# Patient Record
Sex: Female | Born: 1985
Health system: Southern US, Community
[De-identification: ages and names within clinical notes are randomized; demographics above are authoritative.]

## PROBLEM LIST (undated history)

## (undated) DIAGNOSIS — Z8489 Family history of other specified conditions: Secondary | ICD-10-CM

## (undated) DIAGNOSIS — F410 Panic disorder [episodic paroxysmal anxiety] without agoraphobia: Secondary | ICD-10-CM

## (undated) DIAGNOSIS — F4310 Post-traumatic stress disorder, unspecified: Secondary | ICD-10-CM

## (undated) DIAGNOSIS — D66 Hereditary factor VIII deficiency: Secondary | ICD-10-CM

## (undated) DIAGNOSIS — I780 Hereditary hemorrhagic telangiectasia: Secondary | ICD-10-CM

## (undated) DIAGNOSIS — F329 Major depressive disorder, single episode, unspecified: Secondary | ICD-10-CM

## (undated) DIAGNOSIS — G4700 Insomnia, unspecified: Secondary | ICD-10-CM

## (undated) DIAGNOSIS — Z9289 Personal history of other medical treatment: Secondary | ICD-10-CM

## (undated) DIAGNOSIS — J45909 Unspecified asthma, uncomplicated: Secondary | ICD-10-CM

## (undated) DIAGNOSIS — I10 Essential (primary) hypertension: Secondary | ICD-10-CM

## (undated) HISTORY — PX: ABDOMINAL HYSTERECTOMY: SHX81

## (undated) HISTORY — PX: DILATION AND CURETTAGE OF UTERUS: SHX78

## (undated) HISTORY — PX: ABDOMINAL SURGERY: SHX537

## (undated) HISTORY — PX: TUBAL LIGATION: SHX77

## (undated) HISTORY — PX: ENDOMETRIAL ABLATION: SHX621

## (undated) HISTORY — PX: NOSE SURGERY: SHX723

---

## 2011-07-16 DIAGNOSIS — G43009 Migraine without aura, not intractable, without status migrainosus: Secondary | ICD-10-CM | POA: Insufficient documentation

## 2011-11-26 DIAGNOSIS — F329 Major depressive disorder, single episode, unspecified: Secondary | ICD-10-CM | POA: Insufficient documentation

## 2013-06-28 DIAGNOSIS — D649 Anemia, unspecified: Secondary | ICD-10-CM | POA: Insufficient documentation

## 2013-07-27 DIAGNOSIS — F419 Anxiety disorder, unspecified: Secondary | ICD-10-CM | POA: Insufficient documentation

## 2013-07-27 DIAGNOSIS — I10 Essential (primary) hypertension: Secondary | ICD-10-CM | POA: Insufficient documentation

## 2014-06-23 DIAGNOSIS — J301 Allergic rhinitis due to pollen: Secondary | ICD-10-CM | POA: Insufficient documentation

## 2014-08-04 DIAGNOSIS — E669 Obesity, unspecified: Secondary | ICD-10-CM | POA: Insufficient documentation

## 2014-08-10 ENCOUNTER — Emergency Department (HOSPITAL_BASED_OUTPATIENT_CLINIC_OR_DEPARTMENT_OTHER)
Admission: EM | Admit: 2014-08-10 | Discharge: 2014-08-10 | Disposition: A | Discharge status: Home / Self Care | Payer: Medicaid Other | Attending: Emergency Medicine | Admitting: Emergency Medicine

## 2014-08-10 ENCOUNTER — Encounter (HOSPITAL_BASED_OUTPATIENT_CLINIC_OR_DEPARTMENT_OTHER): Payer: Self-pay | Admitting: *Deleted

## 2014-08-10 DIAGNOSIS — W57XXXA Bitten or stung by nonvenomous insect and other nonvenomous arthropods, initial encounter: Secondary | ICD-10-CM | POA: Insufficient documentation

## 2014-08-10 DIAGNOSIS — Z862 Personal history of diseases of the blood and blood-forming organs and certain disorders involving the immune mechanism: Secondary | ICD-10-CM | POA: Insufficient documentation

## 2014-08-10 DIAGNOSIS — Y9389 Activity, other specified: Secondary | ICD-10-CM | POA: Insufficient documentation

## 2014-08-10 DIAGNOSIS — S50361A Insect bite (nonvenomous) of right elbow, initial encounter: Secondary | ICD-10-CM | POA: Diagnosis not present

## 2014-08-10 DIAGNOSIS — Y9289 Other specified places as the place of occurrence of the external cause: Secondary | ICD-10-CM | POA: Insufficient documentation

## 2014-08-10 DIAGNOSIS — S50861A Insect bite (nonvenomous) of right forearm, initial encounter: Secondary | ICD-10-CM | POA: Insufficient documentation

## 2014-08-10 DIAGNOSIS — J45909 Unspecified asthma, uncomplicated: Secondary | ICD-10-CM | POA: Diagnosis not present

## 2014-08-10 DIAGNOSIS — Z880 Allergy status to penicillin: Secondary | ICD-10-CM | POA: Diagnosis not present

## 2014-08-10 DIAGNOSIS — I10 Essential (primary) hypertension: Secondary | ICD-10-CM | POA: Insufficient documentation

## 2014-08-10 DIAGNOSIS — Z79899 Other long term (current) drug therapy: Secondary | ICD-10-CM | POA: Insufficient documentation

## 2014-08-10 DIAGNOSIS — L03113 Cellulitis of right upper limb: Secondary | ICD-10-CM | POA: Diagnosis not present

## 2014-08-10 DIAGNOSIS — R21 Rash and other nonspecific skin eruption: Secondary | ICD-10-CM | POA: Diagnosis present

## 2014-08-10 DIAGNOSIS — S40261A Insect bite (nonvenomous) of right shoulder, initial encounter: Secondary | ICD-10-CM | POA: Diagnosis not present

## 2014-08-10 DIAGNOSIS — R110 Nausea: Secondary | ICD-10-CM | POA: Insufficient documentation

## 2014-08-10 DIAGNOSIS — Y998 Other external cause status: Secondary | ICD-10-CM | POA: Insufficient documentation

## 2014-08-10 HISTORY — DX: Essential (primary) hypertension: I10

## 2014-08-10 HISTORY — DX: Unspecified asthma, uncomplicated: J45.909

## 2014-08-10 HISTORY — DX: Hereditary factor VIII deficiency: D66

## 2014-08-10 HISTORY — DX: Personal history of other medical treatment: Z92.89

## 2014-08-10 MED ORDER — HYDROCORTISONE 1 % EX CREA: TOPICAL_CREAM | CUTANEOUS | Status: DC

## 2014-08-10 MED ORDER — CLINDAMYCIN HCL 150 MG PO CAPS: 450.0000 mg | ORAL_CAPSULE | Freq: Three times a day (TID) | ORAL | Status: DC

## 2014-08-10 NOTE — ED Provider Notes (Signed)
CSN: [id number]     Arrival date & time 08/10/14  0849 History   First MD Initiated Contact with Patient 08/10/14 [phone]     Chief Complaint  Patient presents with  . Abscess     (Consider location/radiation/quality/duration/timing/severity/associated sxs/prior Treatment) Patient is a 29 y.o. female presenting with rash. The history is provided by the patient.  Rash Location:  Shoulder/arm Shoulder/arm rash location:  R forearm, R elbow and R shoulder Quality: itchiness, painful and redness   Pain details:    Quality:  Itching and aching   Severity:  Mild   Onset quality:  Gradual   Duration:  12 hours   Timing:  Constant   Progression:  Worsening Severity:  Mild Context: insect bite/sting   Relieved by:  Nothing Worsened by:  Nothing tried Ineffective treatments:  Antihistamines Associated symptoms: nausea   Associated symptoms: no abdominal pain, no diarrhea, no fever and no shortness of breath     Past Medical History  Diagnosis Date  . Hypertension   . H/O transfusion of packed red blood cells   . Asthma   . Hemophilia    Past Surgical History  Procedure Laterality Date  . Nose surgery    . Cesarean section      x 3  . Tubal ligation    . Endometrial ablation    . Dilation and curettage of uterus     No family history on file. History  Substance Use Topics  . Smoking status: Never Smoker   . Smokeless tobacco: Not on file  . Alcohol Use: Yes     Comment: occassionally   OB History    No data available     Review of Systems  Constitutional: Negative for fever.  Respiratory: Negative for cough and shortness of breath.   Gastrointestinal: Positive for nausea. Negative for abdominal pain and diarrhea.  Skin: Positive for rash.  All other systems reviewed and are negative.     Allergies  Iron and Penicillins  Home Medications   Prior to Admission medications   Medication Sig Start Date End Date Taking? Authorizing Provider  albuterol (PROVENTIL  HFA;VENTOLIN HFA) 108 (90 BASE) MCG/ACT inhaler Inhale 2 puffs into the lungs every 6 (six) hours as needed for wheezing or shortness of breath.   Yes Historical Provider, MD  ALPRAZolam (XANAX PO) Take by mouth.   Yes Historical Provider, MD  Citalopram Hydrobromide (CELEXA PO) Take by mouth.   Yes Historical Provider, MD  Montelukast Sodium (SINGULAIR PO) Take by mouth.   Yes Historical Provider, MD   BP 117/65 mmHg  Pulse 103  Temp(Src) 98.3 F (36.8 C) (Oral)  Resp 20  Ht 5\' 5"  (1.651 m)  Wt 213 lb (96.616 kg)  BMI 35.44 kg/m2  SpO2 100% Physical Exam  Constitutional: She is oriented to person, place, and time. She appears well-developed and well-nourished. No distress.  HENT:  Head: Normocephalic and atraumatic.  Mouth/Throat: Oropharynx is clear and moist.  Eyes: EOM are normal. Pupils are equal, round, and reactive to light.  Neck: Normal range of motion. Neck supple.  Cardiovascular: Normal rate and regular rhythm.  Exam reveals no friction rub.   No murmur heard. Pulmonary/Chest: Effort normal and breath sounds normal. No respiratory distress. She has no wheezes. She has no rales.  Abdominal: Soft. She exhibits no distension. There is no tenderness. There is no rebound.  Musculoskeletal: Normal range of motion. She exhibits no edema.  Neurological: She is alert and oriented to person, place,  and time.  Skin: Rash noted. She is not diaphoretic.     Nursing note and vitals reviewed.   ED Course  Procedures (including critical care time) Labs Review Labs Reviewed - No data to display  Imaging Review No results found.   EKG Interpretation None      MDM   Final diagnoses:  Insect bites  Cellulitis of right upper extremity    29 year old female here with rash on the right arm. Multiple small red areas, root of the posterior shoulder, 2 on the lateral elbow, one on the mid distal volar forearm. The shoulder in the form lesions have small areas of erythema around  them. No relief with antihistamines. She is itching also. The lesions on her shoulder and forearm are bigger than I would've anticipated with that simple insect bite, so we'll treat with hydrocortisone cream and antibiotics to cover for infection. She states she's having some right hand pain with closing her fist, likely due to the lesion on the volar forearm. She does not have any signs concerning for compartment syndrome or severe compression of any nerves or blood vessels. She does not need an emergent hand consult. Will have her follow-up with her doctor back in the ER in 2 days.    Elwin MochaBlair Alka Falwell, MD 08/10/14 [phone]

## 2014-08-10 NOTE — ED Notes (Signed)
4 circular, raised areas on distal aspect of right forearm, proximal forearm and upper arm.  Warmth, erythema, tenderness and swelling noted to areas.  Affected areas marked by this RN.

## 2014-08-10 NOTE — ED Notes (Signed)
Patient states she noticed a possible insect bite to the right inner wrist and right posterior shoulder areas.  States she woke up this morning with additional red swollen areas on the right arm.  Took benadryl last night.  Currently is taking prednisone for a pinched nerve in the left neck.

## 2014-08-10 NOTE — Discharge Instructions (Signed)
Cellulitis °Cellulitis is an infection of the skin and the tissue beneath it. The infected area is usually red and tender. Cellulitis occurs most often in the arms and lower legs.  °CAUSES  °Cellulitis is caused by bacteria that enter the skin through cracks or cuts in the skin. The most common types of bacteria that cause cellulitis are staphylococci and streptococci. °SIGNS AND SYMPTOMS  °· Redness and warmth. °· Swelling. °· Tenderness or pain. °· Fever. °DIAGNOSIS  °Your health care provider can usually determine what is wrong based on a physical exam. Blood tests may also be done. °TREATMENT  °Treatment usually involves taking an antibiotic medicine. °HOME CARE INSTRUCTIONS  °· Take your antibiotic medicine as directed by your health care provider. Finish the antibiotic even if you start to feel better. °· Keep the infected arm or leg elevated to reduce swelling. °· Apply a warm cloth to the affected area up to 4 times per day to relieve pain. °· Take medicines only as directed by your health care provider. °· Keep all follow-up visits as directed by your health care provider. °SEEK MEDICAL CARE IF:  °· You notice red streaks coming from the infected area. °· Your red area gets larger or turns dark in color. °· Your bone or joint underneath the infected area becomes painful after the skin has healed. °· Your infection returns in the same area or another area. °· You notice a swollen bump in the infected area. °· You develop new symptoms. °· You have a fever. °SEEK IMMEDIATE MEDICAL CARE IF:  °· You feel very sleepy. °· You develop vomiting or diarrhea. °· You have a general ill feeling (malaise) with muscle aches and pains. °MAKE SURE YOU:  °· Understand these instructions. °· Will watch your condition. °· Will get help right away if you are not doing well or get worse. °Document Released: 12/18/2004 Document Revised: 07/25/2013 Document Reviewed: 05/26/2011 °[employer]® Patient Information ©2015 [employer].  This information is not intended to replace advice given to you by your health care provider. Make sure you discuss any questions you have with your health care provider. ° °Insect Bite °Mosquitoes, flies, fleas, bedbugs, and many other insects can bite. Insect bites are different from insect stings. A sting is when venom is injected into the skin. Some insect bites can transmit infectious diseases. °SYMPTOMS  °Insect bites usually turn red, swell, and itch for 2 to 4 days. They often go away on their own. °TREATMENT  °Your caregiver may prescribe antibiotic medicines if a bacterial infection develops in the bite. °HOME CARE INSTRUCTIONS °· Do not scratch the bite area. °· Keep the bite area clean and dry. Wash the bite area thoroughly with soap and water. °· Put ice or cool compresses on the bite area. °¨ Put ice in a plastic bag. °¨ Place a towel between your skin and the bag. °¨ Leave the ice on for 20 minutes, 4 times a day for the first 2 to 3 days, or as directed. °· You may apply a baking soda paste, cortisone cream, or calamine lotion to the bite area as directed by your caregiver. This can help reduce itching and swelling. °· Only take over-the-counter or prescription medicines as directed by your caregiver. °· If you are given antibiotics, take them as directed. Finish them even if you start to feel better. °You may need a tetanus shot if: °· You cannot remember when you had your last tetanus shot. °· You have never had a   tetanus shot. °· The injury broke your skin. °If you get a tetanus shot, your arm may swell, get red, and feel warm to the touch. This is common and not a problem. If you need a tetanus shot and you choose not to have one, there is a rare chance of getting tetanus. Sickness from tetanus can be serious. °SEEK IMMEDIATE MEDICAL CARE IF:  °· You have increased pain, redness, or swelling in the bite area. °· You see a red line on the skin coming from the bite. °· You have a fever. °· You have  joint pain. °· You have a headache or neck pain. °· You have unusual weakness. °· You have a rash. °· You have chest pain or shortness of breath. °· You have abdominal pain, nausea, or vomiting. °· You feel unusually tired or sleepy. °MAKE SURE YOU:  °· Understand these instructions. °· Will watch your condition. °· Will get help right away if you are not doing well or get worse. °Document Released: 04/17/2004 Document Revised: 06/02/2011 Document Reviewed: 10/09/2010 °[employer]® Patient Information ©2015 [employer]. This information is not intended to replace advice given to you by your health care provider. Make sure you discuss any questions you have with your health care provider. ° °

## 2014-09-01 ENCOUNTER — Encounter (HOSPITAL_BASED_OUTPATIENT_CLINIC_OR_DEPARTMENT_OTHER): Payer: Self-pay

## 2014-09-01 ENCOUNTER — Emergency Department (HOSPITAL_BASED_OUTPATIENT_CLINIC_OR_DEPARTMENT_OTHER)
Admission: EM | Admit: 2014-09-01 | Discharge: 2014-09-01 | Disposition: A | Discharge status: Home / Self Care | Payer: Medicaid Other | Attending: Emergency Medicine | Admitting: Emergency Medicine

## 2014-09-01 DIAGNOSIS — K047 Periapical abscess without sinus: Secondary | ICD-10-CM | POA: Diagnosis not present

## 2014-09-01 DIAGNOSIS — Z880 Allergy status to penicillin: Secondary | ICD-10-CM | POA: Insufficient documentation

## 2014-09-01 DIAGNOSIS — Z7952 Long term (current) use of systemic steroids: Secondary | ICD-10-CM | POA: Diagnosis not present

## 2014-09-01 DIAGNOSIS — I10 Essential (primary) hypertension: Secondary | ICD-10-CM | POA: Insufficient documentation

## 2014-09-01 DIAGNOSIS — R220 Localized swelling, mass and lump, head: Secondary | ICD-10-CM | POA: Diagnosis present

## 2014-09-01 DIAGNOSIS — Z862 Personal history of diseases of the blood and blood-forming organs and certain disorders involving the immune mechanism: Secondary | ICD-10-CM | POA: Diagnosis not present

## 2014-09-01 DIAGNOSIS — K0381 Cracked tooth: Secondary | ICD-10-CM | POA: Insufficient documentation

## 2014-09-01 DIAGNOSIS — Z79899 Other long term (current) drug therapy: Secondary | ICD-10-CM | POA: Diagnosis not present

## 2014-09-01 DIAGNOSIS — J45909 Unspecified asthma, uncomplicated: Secondary | ICD-10-CM | POA: Diagnosis not present

## 2014-09-01 MED ORDER — HYDROCODONE-ACETAMINOPHEN 5-325 MG PO TABS: 1.0000 | ORAL_TABLET | ORAL | Status: DC | PRN

## 2014-09-01 MED ORDER — LIDOCAINE HCL (PF) 1 % IJ SOLN
5.0000 mL | Freq: Once | INTRAMUSCULAR | Status: AC
  Administered 2014-09-01: 5 mL
  Filled 2014-09-01: qty 5

## 2014-09-01 MED ORDER — CLINDAMYCIN HCL 300 MG PO CAPS: 300.0000 mg | ORAL_CAPSULE | Freq: Three times a day (TID) | ORAL | Status: DC

## 2014-09-01 NOTE — Discharge Instructions (Signed)

## 2014-09-01 NOTE — ED Notes (Signed)
Blanket offered.

## 2014-09-01 NOTE — ED Notes (Signed)
MD at bedside for numbing of mouth for drainage of dental abscess.

## 2014-09-01 NOTE — ED Notes (Signed)
Pt reports chipped tooth in right lower 2 wks ago, woke this morning with swelling to right lower jaw.

## 2014-09-01 NOTE — ED Provider Notes (Signed)
CSN: [id number]     Arrival date & time 09/01/14  4098 History   First MD Initiated Contact with Patient 09/01/14 [phone]     Chief Complaint  Patient presents with  . Oral Swelling     (Consider location/radiation/quality/duration/timing/severity/associated sxs/prior Treatment) HPI Comments: Patient presents with swelling to her right lower jaw. She chipped her back molar about 2 weeks ago. She woke up this morning with some swelling and pain to her right lower jaw. She has a funny taste in her mouth. She denies any fevers or vomiting. She has been taking clindamycin that she got a prescription for May 19 for a possible staph infection on her skin. She states that she filled on May 19 but she still has a large amount of pills in the bottle. She does say that she's been taking it for the last few days as prescribed. She had a one-week supply in the bottle. She also states that her mom gave her a few more of the pelvis.   Past Medical History  Diagnosis Date  . Hypertension   . H/O transfusion of packed red blood cells   . Asthma   . Hemophilia    Past Surgical History  Procedure Laterality Date  . Nose surgery    . Cesarean section      x 3  . Tubal ligation    . Endometrial ablation    . Dilation and curettage of uterus     No family history on file. History  Substance Use Topics  . Smoking status: Never Smoker   . Smokeless tobacco: Not on file  . Alcohol Use: Yes     Comment: occassionally   OB History    No data available     Review of Systems  Constitutional: Negative for fever.  HENT: Positive for dental problem and facial swelling.   Gastrointestinal: Negative for nausea and vomiting.  Musculoskeletal: Negative for back pain and neck pain.  Skin: Negative for wound.  Neurological: Negative for weakness, numbness and headaches.      Allergies  Iron and Penicillins  Home Medications   Prior to Admission medications   Medication Sig Start Date End Date  Taking? Authorizing Provider  albuterol (PROVENTIL HFA;VENTOLIN HFA) 108 (90 BASE) MCG/ACT inhaler Inhale 2 puffs into the lungs every 6 (six) hours as needed for wheezing or shortness of breath.    Historical Provider, MD  ALPRAZolam (XANAX PO) Take by mouth.    Historical Provider, MD  Citalopram Hydrobromide (CELEXA PO) Take by mouth.    Historical Provider, MD  clindamycin (CLEOCIN) 300 MG capsule Take 1 capsule (300 mg total) by mouth 3 (three) times daily. X 7 days 09/01/14   Rolan Bucco, MD  HYDROcodone-acetaminophen (NORCO/VICODIN) 5-325 MG per tablet Take 1-2 tablets by mouth every 4 (four) hours as needed. 09/01/14   Rolan Bucco, MD  hydrocortisone cream 1 % Apply to affected area 2 times daily 08/10/14   Elwin Mocha, MD  Montelukast Sodium (SINGULAIR PO) Take by mouth.    Historical Provider, MD   BP 132/83 mmHg  Pulse 75  Temp(Src) 98.4 F (36.9 C) (Oral)  Resp 18  Ht  (1.651 m)  Wt 214 lb 6 oz (97.24 kg)  BMI 35.67 kg/m2  SpO2 98% Physical Exam  Constitutional: She is oriented to person, place, and time. She appears well-developed and well-nourished.  HENT:  Head: Normocephalic and atraumatic.  Positive chipped tooth to the right back molar. There is some moderate swelling  around the back molar. There is some induration. No trismus. Uvula is midline. No elevation the tongue.  Neck: Normal range of motion. Neck supple.  Cardiovascular: Normal rate.   Pulmonary/Chest: Effort normal.  Neurological: She is alert and oriented to person, place, and time.  Skin: Skin is warm and dry.  Psychiatric: She has a normal mood and affect.    ED Course  INCISION AND DRAINAGE Date/Time: 09/01/2014 10:37 AM Performed by: Claron Rosencrans Authorized by: Rolan Bucco Consent: Verbal consent obtained. Risks and benefits: risks, benefits and alternatives were discussed Consent given by: patient Type: abscess Body area: mouth (periapical) Anesthesia: local infiltration Local  anesthetic: lidocaine 1% without epinephrine Anesthetic total: 3 ml Patient sedated: no Scalpel size: 11 Incision type: elliptical Complexity: simple Drainage: purulent Drainage amount: moderate Patient tolerance: Patient tolerated the procedure well with no immediate complications   (including critical care time) Labs Review Labs Reviewed - No data to display  Imaging Review No results found.   EKG Interpretation None      MDM   Final diagnoses:  Periapical abscess    Patient presents with a periapical abscess. She states that she's been taking clindamycin but it doesn't sound like she's been taking it consistently. Her prescription was filled about 3 weeks ago. Given that I did do an I and D of abscess and was able to get out some purulent drainage. I advised her to do warm compresses to the area and to have close follow-up with her dentist. She was given advice to return here if she has any worsening symptoms or increased facial swelling. I did give her a new prescription for clindamycin and instructed her that she needs to take it regularly and to finish all the antibiotics as prescribed.    Rolan Bucco, MD 09/01/14 [phone]

## 2014-09-01 NOTE — ED Notes (Signed)
MD at bedside. 

## 2014-10-30 ENCOUNTER — Emergency Department (HOSPITAL_BASED_OUTPATIENT_CLINIC_OR_DEPARTMENT_OTHER)
Admission: EM | Admit: 2014-10-30 | Discharge: 2014-10-30 | Disposition: A | Discharge status: Home / Self Care | Payer: Medicaid Other | Attending: Emergency Medicine | Admitting: Emergency Medicine

## 2014-10-30 ENCOUNTER — Encounter (HOSPITAL_BASED_OUTPATIENT_CLINIC_OR_DEPARTMENT_OTHER): Payer: Self-pay | Admitting: *Deleted

## 2014-10-30 ENCOUNTER — Other Ambulatory Visit: Payer: Self-pay

## 2014-10-30 DIAGNOSIS — Z79899 Other long term (current) drug therapy: Secondary | ICD-10-CM | POA: Insufficient documentation

## 2014-10-30 DIAGNOSIS — R079 Chest pain, unspecified: Secondary | ICD-10-CM | POA: Diagnosis not present

## 2014-10-30 DIAGNOSIS — I10 Essential (primary) hypertension: Secondary | ICD-10-CM | POA: Diagnosis not present

## 2014-10-30 DIAGNOSIS — G43909 Migraine, unspecified, not intractable, without status migrainosus: Secondary | ICD-10-CM

## 2014-10-30 DIAGNOSIS — R51 Headache: Secondary | ICD-10-CM | POA: Diagnosis present

## 2014-10-30 DIAGNOSIS — J45909 Unspecified asthma, uncomplicated: Secondary | ICD-10-CM | POA: Diagnosis not present

## 2014-10-30 DIAGNOSIS — Z880 Allergy status to penicillin: Secondary | ICD-10-CM | POA: Insufficient documentation

## 2014-10-30 DIAGNOSIS — Z862 Personal history of diseases of the blood and blood-forming organs and certain disorders involving the immune mechanism: Secondary | ICD-10-CM | POA: Insufficient documentation

## 2014-10-30 MED ORDER — IBUPROFEN 800 MG PO TABS: 800.0000 mg | ORAL_TABLET | Freq: Three times a day (TID) | ORAL | Status: DC

## 2014-10-30 MED ORDER — KETOROLAC TROMETHAMINE 30 MG/ML IJ SOLN
30.0000 mg | Freq: Once | INTRAMUSCULAR | Status: AC
  Administered 2014-10-30: 30 mg via INTRAVENOUS
  Filled 2014-10-30: qty 1

## 2014-10-30 MED ORDER — METOCLOPRAMIDE HCL 5 MG/ML IJ SOLN
10.0000 mg | Freq: Once | INTRAMUSCULAR | Status: AC
  Administered 2014-10-30: 10 mg via INTRAVENOUS
  Filled 2014-10-30: qty 2

## 2014-10-30 MED ORDER — SODIUM CHLORIDE 0.9 % IV BOLUS (SEPSIS)
500.0000 mL | Freq: Once | INTRAVENOUS | Status: AC
  Administered 2014-10-30: 500 mL via INTRAVENOUS

## 2014-10-30 MED ORDER — DIPHENHYDRAMINE HCL 50 MG/ML IJ SOLN
12.5000 mg | Freq: Once | INTRAMUSCULAR | Status: AC
  Administered 2014-10-30: 12.5 mg via INTRAVENOUS
  Filled 2014-10-30: qty 1

## 2014-10-30 NOTE — Discharge Instructions (Signed)
1. Medications: ibuprofen, usual home medications 2. Treatment: rest, drink plenty of fluids 3. Follow Up: please followup with your primary doctor in 2 days for discussion of your diagnoses and further evaluation after today's visit; if you do not have a primary care doctor use the resource guide provided to find one; please return to the ER for new or worsening symptoms   Migraine Headache A migraine headache is an intense, throbbing pain on one or both sides of your head. A migraine can last for 30 minutes to several hours. CAUSES  The exact cause of a migraine headache is not always known. However, a migraine may be caused when nerves in the brain become irritated and release chemicals that cause inflammation. This causes pain. Certain things may also trigger migraines, such as:  Alcohol.  Smoking.  Stress.  Menstruation.  Aged cheeses.  Foods or drinks that contain nitrates, glutamate, aspartame, or tyramine.  Lack of sleep.  Chocolate.  Caffeine.  Hunger.  Physical exertion.  Fatigue.  Medicines used to treat chest pain (nitroglycerine), birth control pills, estrogen, and some blood pressure medicines. SIGNS AND SYMPTOMS  Pain on one or both sides of your head.  Pulsating or throbbing pain.  Severe pain that prevents daily activities.  Pain that is aggravated by any physical activity.  Nausea, vomiting, or both.  Dizziness.  Pain with exposure to bright lights, loud noises, or activity.  General sensitivity to bright lights, loud noises, or smells. Before you get a migraine, you may get warning signs that a migraine is coming (aura). An aura may include:  Seeing flashing lights.  Seeing bright spots, halos, or zigzag lines.  Having tunnel vision or blurred vision.  Having feelings of numbness or tingling.  Having trouble talking.  Having muscle weakness. DIAGNOSIS  A migraine headache is often diagnosed based on:  Symptoms.  Physical  exam.  A CT scan or MRI of your head. These imaging tests cannot diagnose migraines, but they can help rule out other causes of headaches. TREATMENT Medicines may be given for pain and nausea. Medicines can also be given to help prevent recurrent migraines.  HOME CARE INSTRUCTIONS  Only take over-the-counter or prescription medicines for pain or discomfort as directed by your health care provider. The use of long-term narcotics is not recommended.  Lie down in a dark, quiet room when you have a migraine.  Keep a journal to find out what may trigger your migraine headaches. For example, write down:  What you eat and drink.  How much sleep you get.  Any change to your diet or medicines.  Limit alcohol consumption.  Quit smoking if you smoke.  Get 7-9 hours of sleep, or as recommended by your health care provider.  Limit stress.  Keep lights dim if bright lights bother you and make your migraines worse. SEEK IMMEDIATE MEDICAL CARE IF:   Your migraine becomes severe.  You have a fever.  You have a stiff neck.  You have vision loss.  You have muscular weakness or loss of muscle control.  You start losing your balance or have trouble walking.  You feel faint or pass out.  You have severe symptoms that are different from your first symptoms. MAKE SURE YOU:   Understand these instructions.  Will watch your condition.  Will get help right away if you are not doing well or get worse. Document Released: 03/10/2005 Document Revised: 07/25/2013 Document Reviewed: 11/15/2012 [facility] Patient Information 2015 [place], [place]. This information is not  intended to replace advice given to you by your health care provider. Make sure you discuss any questions you have with your health care provider.   Emergency Department Resource Guide 1) Find a Doctor and Pay Out of Pocket Although you won't have to find out who is covered by your insurance plan, it is a good idea to ask around  and get recommendations. You will then need to call the office and see if the doctor you have chosen will accept you as a new patient and what types of options they offer for patients who are self-pay. Some doctors offer discounts or will set up payment plans for their patients who do not have insurance, but you will need to ask so you aren't surprised when you get to your appointment.  2) Contact Your Local Health Department Not all health departments have doctors that can see patients for sick visits, but many do, so it is worth a call to see if yours does. If you don't know where your local health department is, you can check in your phone book. The CDC also has a tool to help you locate your state's health department, and many state websites also have listings of all of their local health departments.  3) Find a Walk-in Clinic If your illness is not likely to be very severe or complicated, you may want to try a walk in clinic. These are popping up all over the country in pharmacies, drugstores, and shopping centers. They're usually staffed by nurse practitioners or physician assistants that have been trained to treat common illnesses and complaints. They're usually fairly quick and inexpensive. However, if you have serious medical issues or chronic medical problems, these are probably not your best option.  No Primary Care Doctor: - Call Health Connect at  [phone] - they can help you locate a primary care doctor that  accepts your insurance, provides certain services, etc. - Physician Referral Service- [phone]  Chronic Pain Problems: Organization         Address  Phone   Notes  [facility]  [phone] Patients need to be referred by their primary care doctor.   Medication Assistance: Organization         Address  Phone   Notes  [facility] Med[facility] [address] [place] [place], [place] [address] [phone] --Must be a resident  of [facility] -- Must have NO insurance coverage whatsoever (no Medicaid/ Medicare, etc.) -- The pt. MUST have a primary care doctor that directs their care regularly and follows them in the community   Med[facility]ist  [phone]   [employer]  [phone]    Agencies that provide inexpensive medical care: Organization         Address  Phone   Notes  [facility]  [phone]   [facility]    [phone]   [facility] [address] [place], [place] [address] [phone]   [facility] [address] [place] [place] [phone]   Planned Parenthood    [phone]   [facility]    [phone]   Community [facility] and [facility]  [address], [place] Phone:  [phone], Fax:  [phone] Hours of Operation:  9 am - 6 pm, M-F.  Also accepts Medicaid/Medicare and self-pay.  [facility]  [address],  [place] Phone: [phone], Fax: [phone]. Hours of Operation:  8:30 am - 5:30 pm, M-F.  Also accepts Medicaid and self-pay.  [facility] [place] [address], [place] Phone: [phone]   [facility] [address] Natasha Bence [place], [place] [phone], Ext. [phone] Mondays & Thursdays: 7-9 AM.  First 15 patients are seen on a first come, first serve basis.    Medicaid-accepting [facility] Providers:  Organization         Address  Phone   Notes  [facility] [address], [place] [phone] Also accepts self-pay patients.  [facility] [address] Laurell Josephs [place], [place]  [phone]   [facility] [address], [place] [phone]   [facility] [address] [place] [phone]   Renaye Rakers [address], [place]   [phone] Only accepts [place]  Access [place] patients after they have their name applied to their card.   Self-Pay (no insurance) in [facility]:  Organization         Address  Phone   Notes  Sickle Cell Patients, [facility] [address] [place], [place] [phone]   [facility] [address] [place], [place] [phone]   [facility]  [address], [place] [phone]   [facility]/Dr. Osei-Bonsu  [address], [place] or [address], [place] [phone] Phone number for both [place] and [place] locations is the same.  Urgent Medical and [facility] [address] [place] [phone]   [facility] [address] [place] or [address] [phone]   [facility] [address], [place] [phone], phone; [phone], fax Sees patients 1st and 3rd Saturday of every month.  Must not qualify for public or private insurance (i.e. Medicaid, Medicare, [place] Health Choice, Veterans' Benefits)  Household income should be no more than 200% of the poverty level The clinic cannot treat you if you are pregnant or think you are pregnant  Sexually transmitted diseases are not treated at the clinic.    Dental Care: Organization         Address  Phone  Notes  [facility] Department of [facility] [address] [place], [place] [phone] Accepts children up to age 25 who are enrolled in [place] or [place] Health Choice; pregnant women with a Medicaid card; and children who have applied for Medicaid or [place] Health Choice, but were declined, whose parents can pay a reduced fee at time of service.  [facility] Department of [facility]  [address], [place] [phone] Accepts children up to age 54 who are enrolled in [place] or [place] Health Choice; pregnant women with a  Medicaid card; and children who have applied for Medicaid or [place] Health Choice, but were declined, whose parents can pay a reduced fee at time of service.  [facility]  [address] [place], [place] [phone] Patients are seen by appointment only. Walk-ins are not accepted. [facility] will see patients 22 years of age and older. Monday - Tuesday (8am-5pm) Most Wednesdays (8:30-5pm) $30 per visit, cash only  [employer] [facility] [employer] [facility] PRO[facility]  [address], [facility] [phone] Patients are seen by appointment only. Walk-ins are not accepted. [facility] will see patients 28 years of age and older. One Wednesday Evening (Monthly: Volunteer Based).  $30 per visit, cash only  [employer] [facility]  [phone] for adults; Children under age 724, call Graduate Pediatric Dentistry at [phone]. Children aged 584-14, please call [phone] to request a pediatric application.  Dental services are provided in all areas of dental care including fillings, crowns and bridges, complete and partial dentures, implants, gum treatment, root canals, and extractions. Preventive care is also provided. Treatment is provided to both adults and children. Patients are selected via a lottery and there is often a waiting list.   [facility] [address], [place]  [phone] [email or web address]   [facility] [address], [place], [place] [phone], Ext. [phone] Second and Fourth Thursday of each month, opens at 6:30 AM; Clinic ends at 9 AM.  Patients are seen on a first-come first-served basis, and a limited number are seen during each clinic.   [facility]  [address], [place], [place] [phone]   Eligibility Requirements You must have lived in [place], [place], or [place] counties for at least the last three months.   You cannot be eligible for state or federal sponsored [employer]  insurance, including CIGNAVeterans Administration, [place]icaid, or [employer]icare.   You generally cannot be eligible for healthcare insurance through your employer.    How to apply: Eligibility screenings are held every Tuesday and Wednesday afternoon from 1:00 pm until 4:00 pm. You do not need an appointment for the interview!  [facility] [address], [place], [place] [phone]   [facility]  [phone]   [facility]  [phone]   [facility]  [phone]    Behavioral Health Resources in the Community: Intensive Outpatient Programs Organization         Address  Phone  Notes  [facility] [address], [place], [place] [phone]   [facility] [address], [place], [place] [phone]   [facility] [address], [place], [place]  [phone]   [facility] [address],  [place], [place] [phone] or [phone]   Substance Abuse Resources Organization         Address  Phone  Notes  [facility]  [phone]   [facility]  [phone]   [facility]  [phone]   [facility]  [phone]   Residential & Outpatient Substance Abuse Program  [phone]   Psychological Services Organization         Address  Phone  Notes  [facility]  [phone]- [phone]   [facility]  [phone]- [phone]   [facility] [address], [place] [phone] or [phone]    Mobile Crisis Teams Organization         Address  Phone  Notes  [facility], Mobile Crisis Care Unit  [phone]   [facility]  [address] [place], [place] [phone]   Doristine LocksSharon DeEsch [address] [place] [phone]    Self-Help/Support Groups Organization         Address  Phone             Notes  [facility]  [facility] [place] - variety  of support groups  [phone] Call for more information  Narcotics Anonymous (NA), [facility] [address], [employer] [place]  2 meetings at this location   Residential Sports administrator         Address  Phone  Notes  [facility] [address],    [place]  [phone]   [facility]  [address], [place] [address], [place], [place] [phone]   [facility] Treatment [facility] [address] [place], [place] [phone] Admissions: 8am-3pm M-F  [facility] [address]    [place], [place] [phone]   [facility] [address] [place], [place], [place] [phone]   [facility] [address]  [place], [place] [phone]   [facility] - Intensive Outpatient [address]ure[address], [place], [place] [phone]   [facility] [place] ([facility] [address] [place]  [place], [place] [phone] or [phone]   [facility] ([facility]) [address] [place], [place] [phone] Accepts Medicaid  [facility] [address],  [place] [phone] Substance Abuse/Addiction Treatment   [facility] Organization         Address  Phone  Notes  [facility]  [phone]   Angie Fava, PhD [address] Ervin Knack [place], [place]   [phone] or [phone]   [facility]   [address] [place], [place] [phone]   [facility] [address], [place], [place] [phone] Insurance/Medicaid/sponsorship through [facility] and Families [address]                                    [place], [place] [phone] Therapy/tele-psych/case  [facility] [address], [place] [phone]    Dr. Lolly Mustache  [phone]   [facility]  [employer] [facility] Dept. 1) [address]  [place] 2) [address], [place] 3)  [address], [place] [phone]  [phone]   [place] Child Abuse Hotline [phone] or [phone] (After Hours)

## 2014-10-30 NOTE — ED Provider Notes (Signed)
CSN: [id number]     Arrival date & time 10/30/14  1037 History   First MD Initiated Contact with Patient 10/30/14 1044     Chief Complaint  Patient presents with  . Headache    HPI   29 year old female with PMH of hypertension who presents to the ED with HA since waking up this morning. She reports her headache is located over her left forehead and radiates to her right forehead. She states she has a history of migraine headaches, and that this feels similar to her headaches in the past. She reports her last headache was approximately one year ago. She reports nausea, phonophobia, photophobia, and states she has "spots" in her vision. Denies vomiting, diarrhea, fever, chills, nuchal rigidity, recent injury or illness. She reports chest tightness, which she attributes to anxiety. Denies shortness of breath.    Past Medical History  Diagnosis Date  . Hypertension   . H/O transfusion of packed red blood cells   . Asthma   . Hemophilia    Past Surgical History  Procedure Laterality Date  . Nose surgery    . Cesarean section      x 3  . Tubal ligation    . Endometrial ablation    . Dilation and curettage of uterus     History reviewed. No pertinent family history. History  Substance Use Topics  . Smoking status: Never Smoker   . Smokeless tobacco: Not on file  . Alcohol Use: Yes     Comment: occassionally   OB History    No data available     Review of Systems  Constitutional: Positive for fatigue. Negative for fever and chills.  Eyes: Negative for pain.       Reports spots in vision.  Respiratory: Negative for shortness of breath.   Cardiovascular: Positive for chest pain. Negative for palpitations.       Reports chest pain, which is unchanged from the chest pain she states she has with anxiety.  Gastrointestinal: Negative for nausea, vomiting, abdominal pain, diarrhea and constipation.  Musculoskeletal: Negative for myalgias, back pain, arthralgias, neck pain and neck  stiffness.  Skin: Negative for rash.  Neurological: Positive for headaches. Negative for dizziness, syncope, facial asymmetry, speech difficulty, weakness, light-headedness and numbness.       Reports headache originating over left forehead and radiating to right forehead.     Allergies  Iron and Penicillins  Home Medications   Prior to Admission medications   Medication Sig Start Date End Date Taking? Authorizing Provider  albuterol (PROVENTIL HFA;VENTOLIN HFA) 108 (90 BASE) MCG/ACT inhaler Inhale 2 puffs into the lungs every 6 (six) hours as needed for wheezing or shortness of breath.    Historical Provider, MD  ALPRAZolam (XANAX PO) Take by mouth.    Historical Provider, MD  Citalopram Hydrobromide (CELEXA PO) Take by mouth.    Historical Provider, MD  Montelukast Sodium (SINGULAIR PO) Take by mouth.    Historical Provider, MD   BP 140/89 mmHg  Pulse 75  Temp(Src) 98 F (36.7 C)  Resp 16  Ht 5\' 5"  (1.651 m)  Wt 215 lb (97.523 kg)  BMI 35.78 kg/m2  SpO2 95% Physical Exam  Constitutional: She is oriented to person, place, and time. She appears well-developed and well-nourished. No distress.  HENT:  Head: Normocephalic and atraumatic.  Right Ear: External ear normal.  Left Ear: External ear normal.  Nose: Nose normal.  Mouth/Throat: Oropharynx is clear and moist and mucous membranes are  normal.  Eyes: Conjunctivae and EOM are normal. Pupils are equal, round, and reactive to light.  Neck: Normal range of motion. Neck supple.  Cardiovascular: Normal rate, regular rhythm, normal heart sounds and intact distal pulses.   Pulmonary/Chest: Effort normal and breath sounds normal. No respiratory distress.  Abdominal: Soft. Normal appearance and bowel sounds are normal. She exhibits no distension and no mass. There is no tenderness. There is no rebound and no guarding.  Musculoskeletal: Normal range of motion.  Lymphadenopathy:    She has no cervical adenopathy.  Neurological: She  is alert and oriented to person, place, and time. She has normal strength. No cranial nerve deficit or sensory deficit.  Skin: Skin is warm and dry. No rash noted. She is not diaphoretic. No erythema. No pallor.  Psychiatric: She has a normal mood and affect. Her behavior is normal.  Nursing note and vitals reviewed.   ED Course  Procedures (including critical care time)  Labs Review Labs Reviewed - No data to display  Imaging Review No results found.   EKG Interpretation   Date/Time:  Monday October 30 2014 11:49:38 EDT Ventricular Rate:  75 PR Interval:  172 QRS Duration: 88 QT Interval:  400 QTC Calculation: 446 R Axis:   47 Text Interpretation:  Normal sinus rhythm Normal ECG No old tracing to  compare Confirmed by Mirian Mo [phone]) on 10/30/2014 11:53:44 AM      MDM   Final diagnoses:  Migraine without status migrainosus, not intractable, unspecified migraine type   Patient HA treated and improved while in ED. Presentation is like her typical migraine HA and not concerning for [facility], ICH, meningitis, or temporal arteritis. Patient is afebrile with no focal neuro deficits, no nuchal rigidity. Patient to follow up with PCP to discuss prophylactic medication.   Patient complains of chest pain, which she attributes to anxiety and reports is exactly like the pain she gets when she is anxious. Denies shortness of breath, radiation of pain, palpitations. EKG negative for acute ischemia. No evidence of ACS. Chest pain resolved throughout ED course with improvement in headache. Patient to follow up with PCP this week. Return precautions discussed. Patient verbalizes understanding and is agreeable with plan.   BP 140/89 mmHg  Pulse 75  Temp(Src) 98 F (36.7 C)  Resp 16  Ht  (1.651 m)  Wt 215 lb (97.523 kg)  BMI 35.78 kg/m2  SpO2 95%      Mady Gemma, PA-C 10/30/14 1724  Mirian Mo, MD 11/01/14 2249

## 2014-10-30 NOTE — ED Notes (Signed)
Pt amb to room 3 with slow, steady gait. Reports ha x this am. Reports photophobia and nausea.

## 2014-12-06 ENCOUNTER — Emergency Department (HOSPITAL_BASED_OUTPATIENT_CLINIC_OR_DEPARTMENT_OTHER)
Admission: EM | Admit: 2014-12-06 | Discharge: 2014-12-06 | Disposition: A | Discharge status: Home / Self Care | Payer: Medicaid Other | Attending: Emergency Medicine | Admitting: Emergency Medicine

## 2014-12-06 ENCOUNTER — Encounter (HOSPITAL_BASED_OUTPATIENT_CLINIC_OR_DEPARTMENT_OTHER): Payer: Self-pay

## 2014-12-06 ENCOUNTER — Emergency Department (HOSPITAL_BASED_OUTPATIENT_CLINIC_OR_DEPARTMENT_OTHER): Payer: Medicaid Other

## 2014-12-06 DIAGNOSIS — J40 Bronchitis, not specified as acute or chronic: Secondary | ICD-10-CM

## 2014-12-06 DIAGNOSIS — Z79899 Other long term (current) drug therapy: Secondary | ICD-10-CM | POA: Diagnosis not present

## 2014-12-06 DIAGNOSIS — J45901 Unspecified asthma with (acute) exacerbation: Secondary | ICD-10-CM | POA: Insufficient documentation

## 2014-12-06 DIAGNOSIS — I10 Essential (primary) hypertension: Secondary | ICD-10-CM | POA: Diagnosis not present

## 2014-12-06 DIAGNOSIS — M546 Pain in thoracic spine: Secondary | ICD-10-CM | POA: Insufficient documentation

## 2014-12-06 DIAGNOSIS — Z862 Personal history of diseases of the blood and blood-forming organs and certain disorders involving the immune mechanism: Secondary | ICD-10-CM | POA: Insufficient documentation

## 2014-12-06 DIAGNOSIS — R52 Pain, unspecified: Secondary | ICD-10-CM | POA: Diagnosis present

## 2014-12-06 DIAGNOSIS — Z880 Allergy status to penicillin: Secondary | ICD-10-CM | POA: Diagnosis not present

## 2014-12-06 MED ORDER — PREDNISONE 20 MG PO TABS
40.0000 mg | ORAL_TABLET | Freq: Once | ORAL | Status: AC
  Administered 2014-12-06: 40 mg via ORAL
  Filled 2014-12-06: qty 2

## 2014-12-06 MED ORDER — KETOROLAC TROMETHAMINE 30 MG/ML IJ SOLN
30.0000 mg | Freq: Once | INTRAMUSCULAR | Status: AC
  Administered 2014-12-06: 30 mg via INTRAMUSCULAR
  Filled 2014-12-06: qty 1

## 2014-12-06 MED ORDER — ALBUTEROL SULFATE HFA 108 (90 BASE) MCG/ACT IN AERS
2.0000 | INHALATION_SPRAY | RESPIRATORY_TRACT | Status: DC | PRN
  Administered 2014-12-06: 2 via RESPIRATORY_TRACT

## 2014-12-06 MED ORDER — PREDNISONE 20 MG PO TABS: 40.0000 mg | ORAL_TABLET | Freq: Every day | ORAL | Status: DC

## 2014-12-06 MED ORDER — ALBUTEROL SULFATE (2.5 MG/3ML) 0.083% IN NEBU
5.0000 mg | INHALATION_SOLUTION | Freq: Once | RESPIRATORY_TRACT | Status: DC
  Filled 2014-12-06: qty 6

## 2014-12-06 MED ORDER — ALBUTEROL SULFATE HFA 108 (90 BASE) MCG/ACT IN AERS
INHALATION_SPRAY | RESPIRATORY_TRACT | Status: AC
  Filled 2014-12-06: qty 6.7

## 2014-12-06 MED ORDER — IPRATROPIUM BROMIDE 0.02 % IN SOLN
0.5000 mg | Freq: Once | RESPIRATORY_TRACT | Status: DC
  Filled 2014-12-06: qty 2.5

## 2014-12-06 NOTE — ED Provider Notes (Signed)
CSN: [id number]     Arrival date & time 12/06/14  1212 History   First MD Initiated Contact with Patient 12/06/14 1237     Chief Complaint  Patient presents with  . Generalized Body Aches     (Consider location/radiation/quality/duration/timing/severity/associated sxs/prior Treatment) HPI Comments: Patient with history of anemia due to heavy menstrual periods s/p ablation presents with complaint of body aches, worse in the upper back, cough with wheezing over the past 2-3 days. Patient has had associated nasal congestion. No fever, cough, nausea, vomiting. No chest pains or significant shortness of breath. Patient has an albuterol inhaler at home which she has been using however she is now out. She does report some wheezing at times. No abdominal pain or urinary symptoms. No skin rash. No known sick contacts. No other treatments prior to arrival. Patient denies risk factors for pulmonary embolism including: unilateral leg swelling, history of DVT/PE/other blood clots, use of estrogens, recent immobilizations, recent surgery, recent travel (>4hr segment), malignancy, hemoptysis.     The history is provided by the patient.    Past Medical History  Diagnosis Date  . Hypertension   . H/O transfusion of packed red blood cells   . Asthma   . Hemophilia    Past Surgical History  Procedure Laterality Date  . Nose surgery    . Cesarean section      x 3  . Tubal ligation    . Endometrial ablation    . Dilation and curettage of uterus     No family history on file. Social History  Substance Use Topics  . Smoking status: Never Smoker   . Smokeless tobacco: None  . Alcohol Use: Yes     Comment: occassionally   OB History    No data available     Review of Systems  Constitutional: Negative for fever and chills.  HENT: Positive for congestion. Negative for rhinorrhea and sore throat.   Eyes: Negative for redness.  Respiratory: Positive for cough and wheezing. Negative for shortness  of breath.   Cardiovascular: Negative for chest pain.  Gastrointestinal: Negative for nausea, vomiting, abdominal pain and diarrhea.  Genitourinary: Negative for dysuria.  Musculoskeletal: Positive for myalgias and back pain.  Skin: Negative for rash.  Neurological: Negative for headaches.      Allergies  Iron; Ibuprofen; and Penicillins  Home Medications   Prior to Admission medications   Medication Sig Start Date End Date Taking? Authorizing Provider  albuterol (PROVENTIL HFA;VENTOLIN HFA) 108 (90 BASE) MCG/ACT inhaler Inhale 2 puffs into the lungs every 6 (six) hours as needed for wheezing or shortness of breath.    Historical Provider, MD  ALPRAZolam (XANAX PO) Take by mouth.    Historical Provider, MD  Citalopram Hydrobromide (CELEXA PO) Take by mouth.    Historical Provider, MD  Montelukast Sodium (SINGULAIR PO) Take by mouth.    Historical Provider, MD   BP 134/96 mmHg  Pulse 66  Temp(Src) 98.7 F (37.1 C) (Oral)  Resp 20  Ht 5\' 5"  (1.651 m)  Wt 198 lb (89.812 kg)  BMI 32.95 kg/m2  SpO2 97% Physical Exam  Constitutional: She appears well-developed and well-nourished.  HENT:  Head: Normocephalic and atraumatic.  Right Ear: Tympanic membrane, external ear and ear canal normal.  Left Ear: Tympanic membrane, external ear and ear canal normal.  Nose: Mucosal edema present. No rhinorrhea.  Mouth/Throat: Oropharynx is clear and moist and mucous membranes are normal. No oropharyngeal exudate, posterior oropharyngeal edema or posterior oropharyngeal erythema.  Eyes: Conjunctivae are normal. Right eye exhibits no discharge. Left eye exhibits no discharge.  Neck: Normal range of motion. Neck supple.  Cardiovascular: Normal rate, regular rhythm and normal heart sounds.   No murmur heard. Pulmonary/Chest: Effort normal. No respiratory distress. She has wheezes (Mild scattered expiratory wheezing). She has no rales.  Abdominal: Soft. There is no tenderness.  Musculoskeletal:        Cervical back: She exhibits normal range of motion, no tenderness and no bony tenderness.       Thoracic back: She exhibits tenderness. She exhibits normal range of motion and no bony tenderness.       Lumbar back: She exhibits normal range of motion, no tenderness and no bony tenderness.       Back:  Neurological: She is alert.  Skin: Skin is warm and dry.  Psychiatric: She has a normal mood and affect.  Nursing note and vitals reviewed.   ED Course  Procedures (including critical care time) Labs Review Labs Reviewed - No data to display  Imaging Review Dg Chest 2 View  12/06/2014   CLINICAL DATA:  Cough and congestion  EXAM: CHEST  2 VIEW  COMPARISON:  None.  FINDINGS: Lungs are clear. Heart size and pulmonary vascularity are normal. No adenopathy. No bone lesions.  IMPRESSION: No edema or consolidation.   Electronically Signed   By: Bretta Bang III M.D.   On: 12/06/2014 13:18   I have personally reviewed and evaluated these images and lab results as part of my medical decision-making.   EKG Interpretation None       Patient seen and examined. Work-up initiated. Medications ordered.   Vital signs reviewed and are as follows: BP 134/96 mmHg  Pulse 66  Temp(Src) 98.7 F (37.1 C) (Oral)  Resp 20  Ht  (1.651 m)  Wt 198 lb (89.812 kg)  BMI 32.95 kg/m2  SpO2 97%  2:24 PM chest x-ray was negative. Patient informed. There was delay in patient getting her breathing treatment and when this was again offered, patient refused. She would like to be discharged with an albuterol inhaler.  Patient urged to return with worsening symptoms or other concerns. Patient verbalized understanding and agrees with plan.    MDM   Final diagnoses:  Bronchitis  Asthma exacerbation   Patient with nasal congestion, wheezing, cough, body aches suggestive of bronchitis. Chest x-ray is negative for pneumonia. Patient treated with albuterol and prednisone. No indication for  anti-biotics at this point. Patient is PERC neg and I have very low clinical suspicion suspicion for PE. Do not feel that additional workup is indicated at this time. Discussed return precautions with patient. She is to return with worsening shortness of breath, worsening pain, high persistent fever, or other concerns.    Renne Crigler, PA-C 12/06/14 1431  Benjiman Core, MD 12/07/14 1540

## 2014-12-06 NOTE — ED Notes (Signed)
C/o body aches, prod cough x 2-3 days

## 2014-12-06 NOTE — Discharge Instructions (Signed)
Please read and follow all provided instructions.  Your diagnoses today include:  1. Bronchitis   2. Asthma exacerbation    Tests performed today include:  Chest x-ray - does not show any pneumonia  Vital signs. See below for your results today.   Medications prescribed:   Prednisone - steroid medicine   It is best to take this medication in the morning to prevent sleeping problems. If you are diabetic, monitor your blood sugar closely and stop taking Prednisone if blood sugar is over 300. Take with food to prevent stomach upset.   Take any prescribed medications only as directed.  Home care instructions:  Follow any educational materials contained in this packet.  Follow-up instructions: Please follow-up with your primary care provider in the next 3 days for further evaluation of your symptoms and a recheck if you are not feeling better.   Return instructions:   Please return to the Emergency Department if you experience worsening symptoms.  Please return with worsening wheezing, shortness of breath, or difficulty breathing.  Return with persistent fever above 101F.   Please return if you have any other emergent concerns.  Additional Information:  Your vital signs today were: BP 134/96 mmHg   Pulse 66   Temp(Src) 98.7 F (37.1 C) (Oral)   Resp 20   Ht  (1.651 m)   Wt 198 lb (89.812 kg)   BMI 32.95 kg/m2   SpO2 97% If your blood pressure (BP) was elevated above 135/85 this visit, please have this repeated by your doctor within one month. --------------

## 2015-01-25 DIAGNOSIS — J45909 Unspecified asthma, uncomplicated: Secondary | ICD-10-CM | POA: Insufficient documentation

## 2015-01-25 DIAGNOSIS — K219 Gastro-esophageal reflux disease without esophagitis: Secondary | ICD-10-CM | POA: Insufficient documentation

## 2015-04-11 ENCOUNTER — Emergency Department (HOSPITAL_BASED_OUTPATIENT_CLINIC_OR_DEPARTMENT_OTHER)
Admission: EM | Admit: 2015-04-11 | Discharge: 2015-04-11 | Disposition: A | Discharge status: Home / Self Care | Payer: Managed Care, Other (non HMO) | Attending: Emergency Medicine | Admitting: Emergency Medicine

## 2015-04-11 ENCOUNTER — Emergency Department (HOSPITAL_BASED_OUTPATIENT_CLINIC_OR_DEPARTMENT_OTHER): Payer: Managed Care, Other (non HMO)

## 2015-04-11 ENCOUNTER — Encounter (HOSPITAL_BASED_OUTPATIENT_CLINIC_OR_DEPARTMENT_OTHER): Payer: Self-pay | Admitting: *Deleted

## 2015-04-11 DIAGNOSIS — J45909 Unspecified asthma, uncomplicated: Secondary | ICD-10-CM | POA: Diagnosis not present

## 2015-04-11 DIAGNOSIS — R42 Dizziness and giddiness: Secondary | ICD-10-CM | POA: Diagnosis present

## 2015-04-11 DIAGNOSIS — Z9889 Other specified postprocedural states: Secondary | ICD-10-CM | POA: Insufficient documentation

## 2015-04-11 DIAGNOSIS — Z862 Personal history of diseases of the blood and blood-forming organs and certain disorders involving the immune mechanism: Secondary | ICD-10-CM | POA: Diagnosis not present

## 2015-04-11 DIAGNOSIS — K047 Periapical abscess without sinus: Secondary | ICD-10-CM | POA: Diagnosis not present

## 2015-04-11 DIAGNOSIS — Z880 Allergy status to penicillin: Secondary | ICD-10-CM | POA: Diagnosis not present

## 2015-04-11 DIAGNOSIS — Z7952 Long term (current) use of systemic steroids: Secondary | ICD-10-CM | POA: Diagnosis not present

## 2015-04-11 DIAGNOSIS — I10 Essential (primary) hypertension: Secondary | ICD-10-CM | POA: Diagnosis not present

## 2015-04-11 DIAGNOSIS — R040 Epistaxis: Secondary | ICD-10-CM | POA: Diagnosis not present

## 2015-04-11 DIAGNOSIS — J341 Cyst and mucocele of nose and nasal sinus: Secondary | ICD-10-CM | POA: Insufficient documentation

## 2015-04-11 DIAGNOSIS — Z79899 Other long term (current) drug therapy: Secondary | ICD-10-CM | POA: Insufficient documentation

## 2015-04-11 LAB — CBC WITH DIFFERENTIAL/PLATELET
Basophils Absolute: 0 10*3/uL (ref 0.0–0.1)
Basophils Relative: 0 %
EOS PCT: 7 %
Eosinophils Absolute: 0.4 10*3/uL (ref 0.0–0.7)
HEMATOCRIT: 41.4 % (ref 36.0–46.0)
Hemoglobin: 14.1 g/dL (ref 12.0–15.0)
Lymphocytes Relative: 36 %
Lymphs Abs: 1.9 10*3/uL (ref 0.7–4.0)
MCH: 28.3 pg (ref 26.0–34.0)
MCHC: 34.1 g/dL (ref 30.0–36.0)
MCV: 83.1 fL (ref 78.0–100.0)
Monocytes Absolute: 0.4 10*3/uL (ref 0.1–1.0)
Monocytes Relative: 7 %
NEUTROS ABS: 2.6 10*3/uL (ref 1.7–7.7)
Neutrophils Relative %: 50 %
PLATELETS: 255 10*3/uL (ref 150–400)
RBC: 4.98 MIL/uL (ref 3.87–5.11)
RDW: 13.6 % (ref 11.5–15.5)
WBC: 5.2 10*3/uL (ref 4.0–10.5)

## 2015-04-11 MED ORDER — SODIUM CHLORIDE 0.9 % IV BOLUS (SEPSIS)
1000.0000 mL | Freq: Once | INTRAVENOUS | Status: AC
  Administered 2015-04-11: 1000 mL via INTRAVENOUS

## 2015-04-11 MED ORDER — ONDANSETRON HCL 4 MG/2ML IJ SOLN
4.0000 mg | Freq: Once | INTRAMUSCULAR | Status: AC
  Administered 2015-04-11: 4 mg via INTRAVENOUS
  Filled 2015-04-11: qty 2

## 2015-04-11 MED ORDER — MECLIZINE HCL 25 MG PO TABS: 25.0000 mg | ORAL_TABLET | Freq: Three times a day (TID) | ORAL | Status: DC | PRN

## 2015-04-11 MED ORDER — MECLIZINE HCL 25 MG PO TABS
25.0000 mg | ORAL_TABLET | Freq: Once | ORAL | Status: AC
  Administered 2015-04-11: 25 mg via ORAL
  Filled 2015-04-11: qty 1

## 2015-04-11 MED ORDER — DIPHENHYDRAMINE HCL 50 MG/ML IJ SOLN
25.0000 mg | Freq: Once | INTRAMUSCULAR | Status: AC
  Administered 2015-04-11: 25 mg via INTRAVENOUS
  Filled 2015-04-11: qty 1

## 2015-04-11 MED FILL — MECLIZINE 25 MG TABLET: 25 | 7 days supply | Qty: 20 | Fill #0

## 2015-04-11 NOTE — ED Notes (Signed)
Patient states she has enlarged veins in her right nare, today while sitting at her desk, her nose began to bleed and bled heavy for approximately 20 minutes.  States when the bleeding stopped she felt light headed when she stood up.  Now feels like the room is spinning,

## 2015-04-11 NOTE — ED Provider Notes (Signed)
CSN: [id number]     Arrival date & time 04/11/15  1004 History   First MD Initiated Contact with Patient 04/11/15 1031     Chief Complaint  Patient presents with  . Dizziness      HPI  She presents for evaluation of dizziness and intermittent nosebleed today. States he felt bleeding lightheaded when she was having episode of bleeding but feels better now. Now she feels like the room is spinning. This will happen to her occasionally. She states she's never taken medication for vertigo before. She also gets frequent nosebleeds. Has some discomfort in her right lower jaw and is scheduled to have "dental surgery" in about one week.  Past Medical History  Diagnosis Date  . Hypertension   . H/O transfusion of packed red blood cells   . Asthma   . Hemophilia [facility])    Past Surgical History  Procedure Laterality Date  . Nose surgery    . Cesarean section      x 3  . Tubal ligation    . Endometrial ablation    . Dilation and curettage of uterus     History reviewed. No pertinent family history. Social History  Substance Use Topics  . Smoking status: Never Smoker   . Smokeless tobacco: None  . Alcohol Use: Yes     Comment: occassionally   OB History    No data available     Review of Systems  Constitutional: Negative for fever, chills, diaphoresis, appetite change and fatigue.  HENT: Positive for dental problem and nosebleeds. Negative for mouth sores, sore throat and trouble swallowing.   Eyes: Negative for visual disturbance.  Respiratory: Negative for cough, chest tightness, shortness of breath and wheezing.   Cardiovascular: Negative for chest pain.  Gastrointestinal: Negative for nausea, vomiting, abdominal pain, diarrhea and abdominal distention.  Endocrine: Negative for polydipsia, polyphagia and polyuria.  Genitourinary: Negative for dysuria, frequency and hematuria.  Musculoskeletal: Negative for gait problem.  Skin: Negative for color change, pallor and rash.   Neurological: Positive for dizziness. Negative for syncope, light-headedness and headaches.  Hematological: Does not bruise/bleed easily.  Psychiatric/Behavioral: Negative for behavioral problems and confusion.      Allergies  Iron; Ibuprofen; and Penicillins  Home Medications   Prior to Admission medications   Medication Sig Start Date End Date Taking? Authorizing Provider  albuterol (PROVENTIL HFA;VENTOLIN HFA) 108 (90 BASE) MCG/ACT inhaler Inhale 2 puffs into the lungs every 6 (six) hours as needed for wheezing or shortness of breath.   Yes Historical Provider, MD  ALPRAZolam (XANAX PO) Take by mouth.   Yes Historical Provider, MD  Citalopram Hydrobromide (CELEXA PO) Take by mouth.   Yes Historical Provider, MD  LOSARTAN POTASSIUM PO Take by mouth.   Yes Historical Provider, MD  Montelukast Sodium (SINGULAIR PO) Take by mouth.   Yes Historical Provider, MD  OVER THE COUNTER MEDICATION OTC Migraine medications   Yes Historical Provider, MD  meclizine (ANTIVERT) 25 MG tablet Take 1 tablet (25 mg total) by mouth 3 (three) times daily as needed. 04/11/15   Rolland Porter, MD  predniSONE (DELTASONE) 20 MG tablet Take 2 tablets (40 mg total) by mouth daily. 12/06/14   Renne Crigler, PA-C   BP 123/87 mmHg  Pulse 81  Temp(Src) 98.1 F (36.7 C) (Oral)  Ht  (1.651 m)  Wt 211 lb (95.709 kg)  BMI 35.11 kg/m2  SpO2 97%  LMP 03/25/2015 (Exact Date) Physical Exam  Constitutional: She is oriented to person, place,  and time. She appears well-developed and well-nourished. No distress.  HENT:  Head: Normocephalic.  Blood in the right naris. No active bleeding. Small area soft tissue swelling adjacent right mandibular premolar noted. No tenderness or discomfort into the neck.  Eyes: Conjunctivae are normal. Pupils are equal, round, and reactive to light. No scleral icterus.  Neck: Normal range of motion. Neck supple. No thyromegaly present.  Cardiovascular: Normal rate and regular rhythm.   Exam reveals no gallop and no friction rub.   No murmur heard. Pulmonary/Chest: Effort normal and breath sounds normal. No respiratory distress. She has no wheezes. She has no rales.  Abdominal: Soft. Bowel sounds are normal. She exhibits no distension. There is no tenderness. There is no rebound.  Musculoskeletal: Normal range of motion.  Neurological: She is alert and oriented to person, place, and time.  Skin: Skin is warm and dry. No rash noted.  Psychiatric: She has a normal mood and affect. Her behavior is normal.    ED Course  Procedures (including critical care time) Labs Review Labs Reviewed  CBC WITH DIFFERENTIAL/PLATELET    Imaging Review Ct Head Wo Contrast  04/11/2015  CLINICAL DATA:  Headache and facial pressure and pain starting 04/07/2015. EXAM: CT HEAD WITHOUT CONTRAST CT MAXILLOFACIAL WITHOUT CONTRAST TECHNIQUE: Multidetector CT imaging of the head and maxillofacial structures were performed using the standard protocol without intravenous contrast. Multiplanar CT image reconstructions of the maxillofacial structures were also generated. COMPARISON:  None. FINDINGS: CT HEAD FINDINGS The brainstem, cerebellum, cerebral peduncles, thalami, basal ganglia, basilar cisterns, and ventricular system appear within normal limits. No intracranial hemorrhage, mass lesion, or acute CVA. 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 CT MAXILLOFACIAL FINDINGS 3.1 by 1.7 by 2.9 cm mucous retention cyst in the right maxillary sinus. The remaining paranasal sinuses appear unremarkable. There is a large dental cavity of tooth #29 with an abnormal periapical lucency extending to the adjacent cortical margin just above the mental foramen, with some mildly asymmetric soft tissue prominence overlying this region potentially representing phlegmon. Other dental cavities are present but are not associated with periapical lucency. No gas is identified in the soft tissues along the mandible.  IMPRESSION: 1. Large dental cavity of tooth 29 with periapical lucency extending to the cortical margin just above the right mental foramen. Periapical abscess not excluded. Questionable phlegmon in the adjacent perimandibular soft tissues. 2. Other cavities are present but are not associated with periapical lucency. Dental referral recommended. 3. Mucous retention cyst in the right maxillary sinus. 4. No significant intracranial abnormality. Electronically Signed   By: Gaylyn Rong M.D.   On: 04/11/2015 11:29   Ct Maxillofacial Wo Cm  04/11/2015  CLINICAL DATA:  Headache and facial pressure and pain starting 04/07/2015. EXAM: CT HEAD WITHOUT CONTRAST CT MAXILLOFACIAL WITHOUT CONTRAST TECHNIQUE: Multidetector CT imaging of the head and maxillofacial structures were performed using the standard protocol without intravenous contrast. Multiplanar CT image reconstructions of the maxillofacial structures were also generated. COMPARISON:  None. FINDINGS: CT HEAD FINDINGS The brainstem, cerebellum, cerebral peduncles, thalami, basal ganglia, basilar cisterns, and ventricular system appear within normal limits. No intracranial hemorrhage, mass lesion, or acute CVA. 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 CT MAXILLOFACIAL FINDINGS 3.1 by 1.7 by 2.9 cm mucous retention cyst in the right maxillary sinus. The remaining paranasal sinuses appear unremarkable. There is a large dental cavity of tooth #29 with an abnormal periapical lucency extending to the adjacent cortical margin just above the mental  foramen, with some mildly asymmetric soft tissue prominence overlying this region potentially representing phlegmon. Other dental cavities are present but are not associated with periapical lucency. No gas is identified in the soft tissues along the mandible. IMPRESSION: 1. Large dental cavity of tooth 29 with periapical lucency extending to the cortical margin just above the right mental foramen. Periapical  abscess not excluded. Questionable phlegmon in the adjacent perimandibular soft tissues. 2. Other cavities are present but are not associated with periapical lucency. Dental referral recommended. 3. Mucous retention cyst in the right maxillary sinus. 4. No significant intracranial abnormality. Electronically Signed   By: Gaylyn Rong M.D.   On: 04/11/2015 11:29   I have personally reviewed and evaluated these images and lab results as part of my medical decision-making.   EKG Interpretation None      MDM   Final diagnoses:  Vertigo  Epistaxis  Mucous retention cyst of maxillary sinus  Dental abscess    Patient's symptoms are improved. CT shows dental abscess. This is apparent clinically. Patient is scheduled for a "surgery" on her dental abscess on Monday. States it is asymptomatic currently. Also has a mucus retention cyst in the right maxilla sinus which I discussed with her. Plan is home. Meclizine when necessary for acute peripheral vertigo. No specific treatment for her recurrent epistaxis which has been without leaving here.    Rolland Porter, MD 04/12/15 1515

## 2015-04-11 NOTE — Discharge Instructions (Signed)
Dental Abscess A dental abscess is pus in or around a tooth. HOME CARE  Take medicines only as told by your dentist.  If you were prescribed antibiotic medicine, finish all of it even if you start to feel better.  Rinse your mouth (gargle) often with salt water.  Do not drive or use heavy machinery, like a lawn mower, while taking pain medicine.  Do not apply heat to the outside of your mouth.  Keep all follow-up visits as told by your dentist. This is important. GET HELP IF:  Your pain is worse, and medicine does not help. GET HELP RIGHT AWAY IF:  You have a fever or chills.  Your symptoms suddenly get worse.  You have a very bad headache.  You have problems breathing or swallowing.  You have trouble opening your mouth.  You have puffiness (swelling) in your neck or around your eye.   This information is not intended to replace advice given to you by your health care provider. Make sure you discuss any questions you have with your health care provider.   Document Released: 07/25/2014 Document Reviewed: 07/25/2014 Elsevier Interactive Patient Education 2016 [employer]  

## 2015-04-20 ENCOUNTER — Other Ambulatory Visit: Payer: Self-pay | Admitting: Medical

## 2015-08-27 ENCOUNTER — Encounter (HOSPITAL_BASED_OUTPATIENT_CLINIC_OR_DEPARTMENT_OTHER): Payer: Self-pay | Admitting: *Deleted

## 2015-08-27 ENCOUNTER — Emergency Department (HOSPITAL_BASED_OUTPATIENT_CLINIC_OR_DEPARTMENT_OTHER): Payer: Managed Care, Other (non HMO)

## 2015-08-27 ENCOUNTER — Emergency Department (HOSPITAL_BASED_OUTPATIENT_CLINIC_OR_DEPARTMENT_OTHER)
Admission: EM | Admit: 2015-08-27 | Discharge: 2015-08-27 | Disposition: A | Discharge status: Home / Self Care | Payer: Managed Care, Other (non HMO) | Attending: Emergency Medicine | Admitting: Emergency Medicine

## 2015-08-27 DIAGNOSIS — R51 Headache: Secondary | ICD-10-CM | POA: Insufficient documentation

## 2015-08-27 DIAGNOSIS — J45901 Unspecified asthma with (acute) exacerbation: Secondary | ICD-10-CM

## 2015-08-27 DIAGNOSIS — Z79899 Other long term (current) drug therapy: Secondary | ICD-10-CM | POA: Diagnosis not present

## 2015-08-27 DIAGNOSIS — N939 Abnormal uterine and vaginal bleeding, unspecified: Secondary | ICD-10-CM | POA: Diagnosis not present

## 2015-08-27 DIAGNOSIS — R0602 Shortness of breath: Secondary | ICD-10-CM | POA: Diagnosis present

## 2015-08-27 DIAGNOSIS — I10 Essential (primary) hypertension: Secondary | ICD-10-CM | POA: Insufficient documentation

## 2015-08-27 DIAGNOSIS — R519 Headache, unspecified: Secondary | ICD-10-CM

## 2015-08-27 HISTORY — DX: Hereditary hemorrhagic telangiectasia: I78.0

## 2015-08-27 LAB — CBC
HEMATOCRIT: 41.8 % (ref 36.0–46.0)
Hemoglobin: 14.7 g/dL (ref 12.0–15.0)
MCH: 30.1 pg (ref 26.0–34.0)
MCHC: 35.2 g/dL (ref 30.0–36.0)
MCV: 85.5 fL (ref 78.0–100.0)
PLATELETS: 320 10*3/uL (ref 150–400)
RBC: 4.89 MIL/uL (ref 3.87–5.11)
RDW: 13.4 % (ref 11.5–15.5)
WBC: 14.4 10*3/uL — AB (ref 4.0–10.5)

## 2015-08-27 LAB — BASIC METABOLIC PANEL
Anion gap: 9 (ref 5–15)
BUN: 9 mg/dL (ref 6–20)
CHLORIDE: 104 mmol/L (ref 101–111)
CO2: 21 mmol/L — ABNORMAL LOW (ref 22–32)
Calcium: 9.4 mg/dL (ref 8.9–10.3)
Creatinine, Ser: 0.67 mg/dL (ref 0.44–1.00)
GFR calc non Af Amer: >60 mL/min (ref >60)
Glucose, Bld: 112 mg/dL — ABNORMAL HIGH (ref 65–99)
POTASSIUM: 4.3 mmol/L (ref 3.5–5.1)
SODIUM: 134 mmol/L — AB (ref 135–145)

## 2015-08-27 MED ORDER — DEXAMETHASONE SODIUM PHOSPHATE 10 MG/ML IJ SOLN
10.0000 mg | Freq: Once | INTRAMUSCULAR | Status: AC
  Administered 2015-08-27: 10 mg via INTRAVENOUS
  Filled 2015-08-27: qty 1

## 2015-08-27 MED ORDER — MAGNESIUM SULFATE 2 GM/50ML IV SOLN
2.0000 g | Freq: Once | INTRAVENOUS | Status: AC
  Administered 2015-08-27: 2 g via INTRAVENOUS
  Filled 2015-08-27: qty 50

## 2015-08-27 MED ORDER — IPRATROPIUM-ALBUTEROL 0.5-2.5 (3) MG/3ML IN SOLN
3.0000 mL | RESPIRATORY_TRACT | Status: DC
  Administered 2015-08-27: 3 mL via RESPIRATORY_TRACT
  Filled 2015-08-27: qty 3

## 2015-08-27 MED ORDER — DIPHENHYDRAMINE HCL 50 MG/ML IJ SOLN
25.0000 mg | Freq: Once | INTRAMUSCULAR | Status: AC
  Administered 2015-08-27: 25 mg via INTRAVENOUS
  Filled 2015-08-27: qty 1

## 2015-08-27 MED ORDER — PROCHLORPERAZINE EDISYLATE 5 MG/ML IJ SOLN
10.0000 mg | Freq: Once | INTRAMUSCULAR | Status: AC
  Administered 2015-08-27: 10 mg via INTRAVENOUS
  Filled 2015-08-27: qty 2

## 2015-08-27 MED ORDER — SODIUM CHLORIDE 0.9 % IV BOLUS (SEPSIS)
1000.0000 mL | Freq: Once | INTRAVENOUS | Status: AC
  Administered 2015-08-27: 1000 mL via INTRAVENOUS

## 2015-08-27 NOTE — ED Notes (Signed)
Sob, chest pain since last night. She has been using a nebulizer and inhaler with some relief. She feels the medication speeding her heart rate. Migraine headache. She is speaking in complete sentences and does not appear to be in distress.

## 2015-08-27 NOTE — ED Notes (Signed)
Pt verbalizes understanding of d/c instructions and denies any further needs at this time. 

## 2015-08-27 NOTE — ED Notes (Signed)
MD at bedside. 

## 2015-08-27 NOTE — ED Provider Notes (Signed)
CSN: [id number]     Arrival date & time 08/27/15  1721 History  By signing my name below, I, Tanya Herrera, attest that this documentation has been prepared under the direction and in the presence of Tanya Memos, MD. Electronically Signed: Bethel Herrera, ED Scribe. 08/27/2015. 5:53 PM   Chief Complaint  Patient presents with  . Shortness of Breath   The history is provided by the patient. No language interpreter was used.   Tanya Herrera is a 30 y.o. female with PMHx of hereditary hemorrhagic telangiectasia, asthma, and HTN who presents to the Emergency Department complaining of constant SOB with onset 2 days ago. The SOB is exacerbated by walking. Her home inhalers provided no relief in SOB but she has had some improvement after nebulizer treatments. Last inhaler use was 30 minutes ago.  Associated symptoms include chest tightness and soreness, fatigue, and headache. She has had similar symptoms with anemia. In the past she has required transfusion on 3 separate occasions. Pt has not been taking any iron supplementation because of a reaction. She is currently menstruating despite having an ablation in April 2015 and reports that she has daily nosebleeds that are relived with a cold compress and pressure. Pt denies pallor, syncope, fever, wheezing, and LE swelling. She is currently on prednisone for a pinched nerve in her back.   Past Medical History  Diagnosis Date  . Hypertension   . H/O transfusion of packed red blood cells   . Asthma   . Hemophilia (HCC)   . HHT (hereditary hemorrhagic telangiectasia) (HCC)    Past Surgical History  Procedure Laterality Date  . Nose surgery    . Cesarean section      x 3  . Tubal ligation    . Endometrial ablation    . Dilation and curettage of uterus     No family history on file. Social History  Substance Use Topics  . Smoking status: Never Smoker   . Smokeless tobacco: None  . Alcohol Use: Yes     Comment: occassionally   OB History     No data available     Review of Systems  Constitutional: Negative for fever.  Respiratory: Positive for chest tightness and shortness of breath. Negative for cough and wheezing.   Cardiovascular: Positive for chest pain. Negative for leg swelling.  Gastrointestinal: Negative for blood in stool.  Genitourinary: Positive for vaginal bleeding.  Neurological: Positive for headaches.  All other systems reviewed and are negative.  Allergies  Iron; Ibuprofen; and Penicillins  Home Medications   Prior to Admission medications   Medication Sig Start Date End Date Taking? Authorizing Provider  albuterol (PROVENTIL HFA;VENTOLIN HFA) 108 (90 BASE) MCG/ACT inhaler Inhale 2 puffs into the lungs every 6 (six) hours as needed for wheezing or shortness of breath.    Historical Provider, MD  ALPRAZolam (XANAX PO) Take by mouth.    Historical Provider, MD  Citalopram Hydrobromide (CELEXA PO) Take by mouth.    Historical Provider, MD  LOSARTAN POTASSIUM PO Take by mouth.    Historical Provider, MD  meclizine (ANTIVERT) 25 MG tablet Take 1 tablet (25 mg total) by mouth 3 (three) times daily as needed. 04/11/15   Rolland Porter, MD  Montelukast Sodium (SINGULAIR PO) Take by mouth.    Historical Provider, MD  OVER THE COUNTER MEDICATION OTC Migraine medications    Historical Provider, MD  predniSONE (DELTASONE) 20 MG tablet Take 2 tablets (40 mg total) by mouth daily. 12/06/14   Ivin Booty  Geiple, PA-C   BP 125/93 mmHg  Pulse 93  Temp(Src) 98.1 F (36.7 C) (Oral)  Resp 18  Ht 5\' 5"  (1.651 m)  Wt 201 lb (91.173 kg)  BMI 33.45 kg/m2  SpO2 99%  LMP 08/24/2015 Physical Exam  Eyes: Conjunctivae are normal.  Cardiovascular: Regular rhythm.  Tachycardia present.   Normal capillary refill   Pulmonary/Chest: Effort normal. She has wheezes.  Decreased breath sounds bilaterally No rales or wheezing   Abdominal: Soft. There is no tenderness. There is no rebound.  Musculoskeletal: Normal range of motion. She  exhibits no edema or tenderness.  Neurological:  No altered mental status, able to give full seemingly accurate history.  Face is symmetric, EOM's intact, pupils equal and reactive, vision intact, tongue and uvula midline without deviation Upper and Lower extremity motor 5/5, intact pain perception in distal extremities, 2+ reflexes in biceps, patella and achilles tendons. Finger to nose normal, heel to shin normal. Walks without assistance or evident ataxia.   Skin: Skin is warm and dry.    ED Course  Procedures (including critical care time) DIAGNOSTIC STUDIES: Oxygen Saturation is 99% on RA,  normal by my interpretation.    COORDINATION OF CARE: 5:44 PM Discussed treatment plan which includes lab work and EKG with pt at bedside and pt agreed to plan.  Labs Review Labs Reviewed  BASIC METABOLIC PANEL - Abnormal; Notable for the following:    Sodium 134 (*)    CO2 21 (*)    Glucose, Bld 112 (*)    All other components within normal limits  CBC - Abnormal; Notable for the following:    WBC 14.4 (*)    All other components within normal limits    Imaging Review Dg Chest 2 View  08/27/2015  CLINICAL DATA:  Chest pain and shortness of breath for 2 days. EXAM: CHEST  2 VIEW COMPARISON:  12/06/2014 FINDINGS: The heart size and mediastinal contours are within normal limits. Both lungs are clear. The visualized skeletal structures are unremarkable. IMPRESSION: Normal chest x-ray . Electronically Signed   By: Rudie MeyerP.  Gallerani M.D.   On: 08/27/2015 18:39   I have personally reviewed and evaluated these lab results as part of my medical decision-making.   EKG Interpretation None      MDM   Final diagnoses:  Nonintractable headache, unspecified chronicity pattern, unspecified headache type  Asthma exacerbation    He with multiple complaints but is seems like her headache is the most prevalent. This improved prior to discharge. Her sugars of breath also improved with some  albuterol.Decadron was given with a headache cocktail so no further steroids needed for the asthma. Doubt PE or other emergent causes for her symptoms.  New Prescriptions: Discharge Medication List as of 08/27/2015  8:27 PM       I have personally and contemperaneously reviewed labs and imaging and used in my decision making as above.   A medical screening exam was performed and I feel the patient has had an appropriate workup for their chief complaint at this time and likelihood of emergent condition existing is low and thus workup can continue on an outpatient basis.. Their vital signs are stable. They have been counseled on decision, discharge, follow up and which symptoms necessitate immediate return to the emergency department.  They verbally stated understanding and agreement with plan and discharged in stable condition.   I personally performed the services described in this documentation, which was scribed in my presence. The recorded information has  been reviewed and is accurate.     Tanya Memos, MD 08/28/15 [phone]

## 2015-09-10 ENCOUNTER — Encounter (HOSPITAL_BASED_OUTPATIENT_CLINIC_OR_DEPARTMENT_OTHER): Payer: Self-pay | Admitting: Emergency Medicine

## 2015-09-10 ENCOUNTER — Emergency Department (HOSPITAL_BASED_OUTPATIENT_CLINIC_OR_DEPARTMENT_OTHER)
Admission: EM | Admit: 2015-09-10 | Discharge: 2015-09-10 | Disposition: A | Discharge status: Home / Self Care | Payer: Managed Care, Other (non HMO) | Attending: Emergency Medicine | Admitting: Emergency Medicine

## 2015-09-10 DIAGNOSIS — J028 Acute pharyngitis due to other specified organisms: Secondary | ICD-10-CM | POA: Insufficient documentation

## 2015-09-10 DIAGNOSIS — I10 Essential (primary) hypertension: Secondary | ICD-10-CM | POA: Insufficient documentation

## 2015-09-10 DIAGNOSIS — B9689 Other specified bacterial agents as the cause of diseases classified elsewhere: Secondary | ICD-10-CM

## 2015-09-10 DIAGNOSIS — Z79899 Other long term (current) drug therapy: Secondary | ICD-10-CM | POA: Diagnosis not present

## 2015-09-10 DIAGNOSIS — J45909 Unspecified asthma, uncomplicated: Secondary | ICD-10-CM | POA: Diagnosis not present

## 2015-09-10 DIAGNOSIS — D66 Hereditary factor VIII deficiency: Secondary | ICD-10-CM | POA: Diagnosis not present

## 2015-09-10 LAB — RAPID STREP SCREEN (MED CTR MEBANE ONLY): Streptococcus, Group A Screen (Direct): NEGATIVE

## 2015-09-10 MED ORDER — DEXAMETHASONE SODIUM PHOSPHATE 10 MG/ML IJ SOLN
10.0000 mg | Freq: Once | INTRAMUSCULAR | Status: AC
  Administered 2015-09-10: 10 mg via INTRAMUSCULAR
  Filled 2015-09-10: qty 1

## 2015-09-10 MED ORDER — CLINDAMYCIN HCL 150 MG PO CAPS: 450.0000 mg | ORAL_CAPSULE | Freq: Three times a day (TID) | ORAL | Status: DC

## 2015-09-10 MED ORDER — CLINDAMYCIN HCL 150 MG PO CAPS
450.0000 mg | ORAL_CAPSULE | Freq: Once | ORAL | Status: AC
  Administered 2015-09-10: 450 mg via ORAL
  Filled 2015-09-10: qty 3

## 2015-09-10 MED ORDER — HYDROCODONE-ACETAMINOPHEN 5-325 MG PO TABS: 1.0000 | ORAL_TABLET | Freq: Four times a day (QID) | ORAL | Status: DC | PRN

## 2015-09-10 MED ORDER — HYDROCODONE-ACETAMINOPHEN 5-325 MG PO TABS
2.0000 | ORAL_TABLET | Freq: Once | ORAL | Status: AC
  Administered 2015-09-10: 2 via ORAL
  Filled 2015-09-10: qty 2

## 2015-09-10 MED FILL — CLINDAMYCIN HCL 150 MG CAP: 150 | 10 days supply | Qty: 90 | Fill #0

## 2015-09-10 MED FILL — HYDROCODON-APAP 5-325: 5-325 | 2 days supply | Qty: 7 | Fill #0

## 2015-09-10 NOTE — Discharge Instructions (Signed)
1. Medications: clindamycin, vicoden only for severe pain, usual home medications 2. Treatment: rest, drink plenty of fluids,  3. Follow Up: Please followup with your primary doctor in 2 days for discussion of your diagnoses and further evaluation after today's visit; if you do not have a primary care doctor use the resource guide provided to find one; Please return to the ER for worsening symptoms, high fevers, inability to swallow, difficulty breathing

## 2015-09-10 NOTE — ED Notes (Signed)
Sore throat x 3 days

## 2015-09-10 NOTE — ED Provider Notes (Signed)
CSN: [id number]     Arrival date & time 09/10/15  1453 History   First MD Initiated Contact with Patient 09/10/15 1510     Chief Complaint  Patient presents with  . Sore Throat     (Consider location/radiation/quality/duration/timing/severity/associated sxs/prior Treatment) The history is provided by the patient and medical records. No language interpreter was used.     Tanya Herrera is a 30 y.o. female  with a hx of HTN, asthma, hemophilia, HHT presents to the Emergency Department complaining of gradual, persistent, progressively worsening sore throat onset 3 days ago.  Pt denies travel or recent sick contacts. Associated symptoms include anterior neck pain, fever to 101 last night.  Pt has been taking fever reducer with adequate alleviation of her symptoms, but reports the pain is getting worse.  She reports decreased PO intake due to the pain.  Pt reports painful swallowing, but she is able to handle her secretions.    Past Medical History  Diagnosis Date  . Hypertension   . H/O transfusion of packed red blood cells   . Asthma   . Hemophilia (HCC)   . HHT (hereditary hemorrhagic telangiectasia) (HCC)    Past Surgical History  Procedure Laterality Date  . Nose surgery    . Cesarean section      x 3  . Tubal ligation    . Endometrial ablation    . Dilation and curettage of uterus     History reviewed. No pertinent family history. Social History  Substance Use Topics  . Smoking status: Never Smoker   . Smokeless tobacco: None  . Alcohol Use: Yes     Comment: occassionally   OB History    No data available     Review of Systems  Constitutional: Positive for fever. Negative for chills and fatigue.  HENT: Positive for sore throat and trouble swallowing ( painful). Negative for congestion, dental problem, drooling, ear pain, facial swelling, mouth sores, postnasal drip, rhinorrhea and voice change.   Eyes: Negative for pain.  Respiratory: Negative for cough, chest  tightness and shortness of breath.   Cardiovascular: Negative for chest pain.  Gastrointestinal: Negative for nausea, vomiting and abdominal pain.  Musculoskeletal: Negative for neck pain and neck stiffness.  Skin: Negative for rash.  Neurological: Negative for facial asymmetry and headaches.  Hematological: Positive for adenopathy.  Psychiatric/Behavioral: The patient is not nervous/anxious.       Allergies  Iron; Ibuprofen; and Penicillins  Home Medications   Prior to Admission medications   Medication Sig Start Date End Date Taking? Authorizing Provider  albuterol (PROVENTIL HFA;VENTOLIN HFA) 108 (90 BASE) MCG/ACT inhaler Inhale 2 puffs into the lungs every 6 (six) hours as needed for wheezing or shortness of breath.    Historical Provider, MD  ALPRAZolam (XANAX PO) Take by mouth.    Historical Provider, MD  Citalopram Hydrobromide (CELEXA PO) Take by mouth.    Historical Provider, MD  clindamycin (CLEOCIN) 150 MG capsule Take 3 capsules (450 mg total) by mouth 3 (three) times daily. 09/10/15   Obinna Ehresman, PA-C  HYDROcodone-acetaminophen (NORCO/VICODIN) 5-325 MG tablet Take 1-2 tablets by mouth every 6 (six) hours as needed for moderate pain or severe pain. 09/10/15   Shelma Eiben, PA-C  LOSARTAN POTASSIUM PO Take by mouth.    Historical Provider, MD  meclizine (ANTIVERT) 25 MG tablet Take 1 tablet (25 mg total) by mouth 3 (three) times daily as needed. 04/11/15   Rolland Porter, MD  Montelukast Sodium (SINGULAIR PO) Take by  mouth.    Historical Provider, MD  OVER THE COUNTER MEDICATION OTC Migraine medications    Historical Provider, MD  predniSONE (DELTASONE) 20 MG tablet Take 2 tablets (40 mg total) by mouth daily. 12/06/14   Renne CriglerJoshua Geiple, PA-C   BP 122/97 mmHg  Pulse 103  Temp(Src) 99.1 F (37.3 C) (Oral)  Resp 18  Ht 5\' 5"  (1.651 m)  Wt 86.183 kg  BMI 31.62 kg/m2  SpO2 99%  LMP 08/24/2015 Physical Exam  Constitutional: She appears well-developed and  well-nourished. No distress.  HENT:  Head: Normocephalic and atraumatic.  Right Ear: Tympanic membrane, external ear and ear canal normal.  Left Ear: Tympanic membrane, external ear and ear canal normal.  Nose: Nose normal. No mucosal edema or rhinorrhea.  Mouth/Throat: Uvula is midline and mucous membranes are normal. Mucous membranes are not dry. No trismus in the jaw. No uvula swelling. Oropharyngeal exudate, posterior oropharyngeal edema and posterior oropharyngeal erythema present. No tonsillar abscesses.  Posterior oropharynx with erythema and edema of the tonsils; no bleeding  Eyes: Conjunctivae are normal.  Neck: Normal range of motion, full passive range of motion without pain and phonation normal. No tracheal tenderness, no spinous process tenderness and no muscular tenderness present. No rigidity. No erythema and normal range of motion present. No Brudzinski's sign and no Kernig's sign noted.  Range of motion without pain  No midline or paraspinal tenderness No muffled voice, but pt reports pain with talking and therefore does very little of this No stridor Handling secretions without difficulty No nuchal rigidity or meningeal signs  Cardiovascular: Regular rhythm, normal heart sounds and intact distal pulses.  Tachycardia present.   Pulses:      Radial pulses are 2+ on the right side, and 2+ on the left side.  Mild tachycardia  Pulmonary/Chest: Effort normal and breath sounds normal. No stridor. No respiratory distress. She has no decreased breath sounds. She has no wheezes.  Equal chest expansion, clear and equal breath sounds without focal wheezes, rhonchi or rales  Musculoskeletal: Normal range of motion.  Lymphadenopathy:       Head (right side): Submandibular and tonsillar adenopathy present. No submental, no preauricular, no posterior auricular and no occipital adenopathy present.       Head (left side): Submandibular and tonsillar adenopathy present. No submental, no  preauricular, no posterior auricular and no occipital adenopathy present.    She has cervical adenopathy (bilateral, anterior, tender).       Right cervical: No superficial cervical, no deep cervical and no posterior cervical adenopathy present.      Left cervical: No superficial cervical, no deep cervical and no posterior cervical adenopathy present.  Neurological: She is alert.  Alert and oriented Moves all extremities without ataxia  Skin: Skin is warm and dry. No rash noted. She is not diaphoretic.  Psychiatric: She has a normal mood and affect.  Nursing note and vitals reviewed.   ED Course  Procedures (including critical care time) Labs Review Labs Reviewed  RAPID STREP SCREEN (NOT AT [facility])  CULTURE, GROUP A STREP [facility])    MDM   Final diagnoses:  Bacterial pharyngitis   Hollice EspyArielle Aronoff presents with sore throat.  Pt febrile with tonsillar erythema and edema, cervical lymphadenopathy, & dysphagia; diagnosis of pharyngitis. Treated in the ED with steroids and pain medication.  Pt appears mildly dehydrated, discussed importance of water rehydration. No lateralizing symptoms. Presentation and clinical exam non concerning for PTA or infxn spread to soft tissue. No trismus or uvula  deviation. Specific return precautions discussed. Pt able to drink water in ED without difficulty with intact air way. Recommended PCP follow up. Discussed reasons to return to the ED including difficulty breathing or swallowing.     Dahlia Client Kataya Guimont, PA-C 09/10/15 1629  Lyndal Pulley, MD 09/11/15 [phone]

## 2015-09-13 LAB — CULTURE, GROUP A STREP (THRC)

## 2015-10-26 ENCOUNTER — Emergency Department (HOSPITAL_BASED_OUTPATIENT_CLINIC_OR_DEPARTMENT_OTHER)
Admission: EM | Admit: 2015-10-26 | Discharge: 2015-10-26 | Disposition: A | Discharge status: Home / Self Care | Payer: Managed Care, Other (non HMO) | Attending: Emergency Medicine | Admitting: Emergency Medicine

## 2015-10-26 ENCOUNTER — Encounter (HOSPITAL_BASED_OUTPATIENT_CLINIC_OR_DEPARTMENT_OTHER): Payer: Self-pay | Admitting: *Deleted

## 2015-10-26 DIAGNOSIS — R5383 Other fatigue: Secondary | ICD-10-CM | POA: Diagnosis not present

## 2015-10-26 DIAGNOSIS — Z7951 Long term (current) use of inhaled steroids: Secondary | ICD-10-CM | POA: Diagnosis not present

## 2015-10-26 DIAGNOSIS — J45909 Unspecified asthma, uncomplicated: Secondary | ICD-10-CM | POA: Diagnosis not present

## 2015-10-26 DIAGNOSIS — R109 Unspecified abdominal pain: Secondary | ICD-10-CM | POA: Diagnosis present

## 2015-10-26 DIAGNOSIS — I10 Essential (primary) hypertension: Secondary | ICD-10-CM | POA: Insufficient documentation

## 2015-10-26 DIAGNOSIS — Z79899 Other long term (current) drug therapy: Secondary | ICD-10-CM | POA: Diagnosis not present

## 2015-10-26 DIAGNOSIS — R112 Nausea with vomiting, unspecified: Secondary | ICD-10-CM | POA: Diagnosis not present

## 2015-10-26 DIAGNOSIS — N926 Irregular menstruation, unspecified: Secondary | ICD-10-CM | POA: Insufficient documentation

## 2015-10-26 LAB — COMPREHENSIVE METABOLIC PANEL
ALT: 21 U/L (ref 14–54)
AST: 26 U/L (ref 15–41)
Albumin: 4.1 g/dL (ref 3.5–5.0)
Alkaline Phosphatase: 69 U/L (ref 38–126)
Anion gap: 7 (ref 5–15)
BILIRUBIN TOTAL: 0.6 mg/dL (ref 0.3–1.2)
BUN: 12 mg/dL (ref 6–20)
CO2: 25 mmol/L (ref 22–32)
CREATININE: 0.85 mg/dL (ref 0.44–1.00)
Calcium: 9.3 mg/dL (ref 8.9–10.3)
Chloride: 103 mmol/L (ref 101–111)
Glucose, Bld: 93 mg/dL (ref 65–99)
Potassium: 3.9 mmol/L (ref 3.5–5.1)
Sodium: 135 mmol/L (ref 135–145)
TOTAL PROTEIN: 7.5 g/dL (ref 6.5–8.1)

## 2015-10-26 LAB — CBC WITH DIFFERENTIAL/PLATELET
BASOS ABS: 0 10*3/uL (ref 0.0–0.1)
Basophils Relative: 0 %
Eosinophils Absolute: 0.7 10*3/uL (ref 0.0–0.7)
Eosinophils Relative: 11 %
HEMATOCRIT: 42.2 % (ref 36.0–46.0)
Hemoglobin: 15.1 g/dL — ABNORMAL HIGH (ref 12.0–15.0)
LYMPHS ABS: 2 10*3/uL (ref 0.7–4.0)
Lymphocytes Relative: 29 %
MCH: 30.2 pg (ref 26.0–34.0)
MCHC: 35.8 g/dL (ref 30.0–36.0)
MCV: 84.4 fL (ref 78.0–100.0)
MONO ABS: 0.4 10*3/uL (ref 0.1–1.0)
Monocytes Relative: 5 %
Neutro Abs: 3.7 10*3/uL (ref 1.7–7.7)
Neutrophils Relative %: 55 %
Platelets: 295 10*3/uL (ref 150–400)
RBC: 5 MIL/uL (ref 3.87–5.11)
RDW: 13.8 % (ref 11.5–15.5)
WBC: 6.8 10*3/uL (ref 4.0–10.5)

## 2015-10-26 LAB — LIPASE, BLOOD: LIPASE: 20 U/L (ref 11–51)

## 2015-10-26 LAB — URINALYSIS, ROUTINE W REFLEX MICROSCOPIC
BILIRUBIN URINE: NEGATIVE
Glucose, UA: NEGATIVE mg/dL
HGB URINE DIPSTICK: NEGATIVE
KETONES UR: NEGATIVE mg/dL
Leukocytes, UA: NEGATIVE
NITRITE: NEGATIVE
PROTEIN: NEGATIVE mg/dL
Specific Gravity, Urine: 1.021 (ref 1.005–1.030)
pH: 6.5 (ref 5.0–8.0)

## 2015-10-26 LAB — PREGNANCY, URINE: PREG TEST UR: NEGATIVE

## 2015-10-26 MED ORDER — ONDANSETRON HCL 4 MG PO TABS: 4.0000 mg | ORAL_TABLET | Freq: Three times a day (TID) | ORAL | 0 refills | Status: DC | PRN

## 2015-10-26 MED ORDER — ONDANSETRON HCL 4 MG/2ML IJ SOLN
4.0000 mg | Freq: Once | INTRAMUSCULAR | Status: AC
  Administered 2015-10-26: 4 mg via INTRAVENOUS
  Filled 2015-10-26: qty 2

## 2015-10-26 MED ORDER — SODIUM CHLORIDE 0.9 % IV BOLUS (SEPSIS)
500.0000 mL | Freq: Once | INTRAVENOUS | Status: AC
  Administered 2015-10-26: 500 mL via INTRAVENOUS

## 2015-10-26 MED FILL — ONDANSETRON HCL 4 MG TABLET: 4 | 6 days supply | Qty: 20 | Fill #0

## 2015-10-26 NOTE — ED Provider Notes (Signed)
[facility] Provider Note   CSN: [id number] Arrival date & time: 10/26/15  8828  First Provider Contact:  First MD Initiated Contact with Patient 10/26/15 1040        History   Chief Complaint Chief Complaint  Patient presents with  . Abdominal Pain    HPI Tanya Herrera is a 30 y.o. female.  Tanya Herrera is a 30 yo F with a pmhx of hereditary hemorrhagic telangiectasias (HHT), daily nose bleeds, heavy menses requiring transfusions in the past, now s/p vaginal ablation who presents with complaints of nausea and vomiting. She says she was in her usual state of health until yesterday when she started to experience some abdominal discomfort and nausea. Today, nausea acutely worsened while she was trying to eat at work. She experienced 20 minutes of non bloody non bilious emesis. She is currently on her menstrual cycle which started Sunday. She also endorsees fatigue. She denies fevers, recent sick contacts, and any changes in her diet. Last BM this morning was normal. Of note, patient was started on doxycycline for a sinus infection on Sunday. She denies any other recent medication changes. Patient is sexually active with 2 partners and does not use protection. However denies dysuria and vaginal discharge. Partners have been asymptomatic. ROS otherwise negative.       Past Medical History:  Diagnosis Date  . Asthma   . H/O transfusion of packed red blood cells   . Hemophilia (HCC)   . HHT (hereditary hemorrhagic telangiectasia) (HCC)   . Hypertension     There are no active problems to display for this patient.   Past Surgical History:  Procedure Laterality Date  . CESAREAN SECTION     x 3  . DILATION AND CURETTAGE OF UTERUS    . ENDOMETRIAL ABLATION    . NOSE SURGERY    . TUBAL LIGATION      OB History    No data available       Home Medications    Prior to Admission medications   Medication Sig Start Date End Date Taking? Authorizing Provider    acetaminophen (TYLENOL) 500 MG tablet Take 500 mg by mouth every 6 (six) hours as needed.   Yes Historical Provider, MD  albuterol (PROVENTIL HFA;VENTOLIN HFA) 108 (90 BASE) MCG/ACT inhaler Inhale 2 puffs into the lungs every 6 (six) hours as needed for wheezing or shortness of breath.   Yes Historical Provider, MD  ALPRAZolam (XANAX PO) Take by mouth.   Yes Historical Provider, MD  Citalopram Hydrobromide (CELEXA PO) Take by mouth.   Yes Historical Provider, MD  doxycycline (VIBRAMYCIN) 100 MG capsule Take 100 mg by mouth 2 (two) times daily.   Yes Historical Provider, MD  Montelukast Sodium (SINGULAIR PO) Take by mouth.   Yes Historical Provider, MD  phentermine 37.5 MG capsule Take 37.5 mg by mouth every morning.   Yes Historical Provider, MD  clindamycin (CLEOCIN) 150 MG capsule Take 3 capsules (450 mg total) by mouth 3 (three) times daily. 09/10/15   Hannah Muthersbaugh, PA-C  HYDROcodone-acetaminophen (NORCO/VICODIN) 5-325 MG tablet Take 1-2 tablets by mouth every 6 (six) hours as needed for moderate pain or severe pain. 09/10/15   Hannah Muthersbaugh, PA-C  LOSARTAN POTASSIUM PO Take by mouth.    Historical Provider, MD  meclizine (ANTIVERT) 25 MG tablet Take 1 tablet (25 mg total) by mouth 3 (three) times daily as needed. 04/11/15   Rolland Porter, MD  OVER THE COUNTER MEDICATION OTC Migraine medications  Historical Provider, MD  predniSONE (DELTASONE) 20 MG tablet Take 2 tablets (40 mg total) by mouth daily. 12/06/14   Renne Crigler, PA-C    Family History History reviewed. No pertinent family history.  Social History Social History  Substance Use Topics  . Smoking status: Never Smoker  . Smokeless tobacco: Never Used  . Alcohol use Yes     Comment: occassionally     Allergies   Iron; Ibuprofen; and Penicillins   Review of Systems Review of Systems  Constitutional: Positive for fatigue. Negative for fever.  HENT: Negative.   Eyes: Negative.   Respiratory: Negative.    Cardiovascular: Negative.   Gastrointestinal: Positive for abdominal pain, nausea and vomiting. Negative for blood in stool, constipation and diarrhea.  Endocrine: Negative.   Genitourinary: Positive for menstrual problem.  Musculoskeletal: Negative.   Skin: Negative.   Allergic/Immunologic: Negative.   Neurological: Negative.   Hematological: Negative.   Psychiatric/Behavioral: Negative.      Physical Exam Updated Vital Signs BP 130/95 (BP Location: Right Arm)   Pulse 95   Temp 98.4 F (36.9 C) (Oral)   Resp 20   Ht  (1.651 m)   Wt 81.6 kg   LMP 10/19/2015   SpO2 100%   BMI 29.95 kg/m   Physical Exam  Constitutional: She appears well-developed and well-nourished. No distress.  HENT:  Head: Normocephalic and atraumatic.  Eyes: Conjunctivae are normal.  Neck: Neck supple.  Cardiovascular: Normal rate and regular rhythm.   No murmur heard. Pulmonary/Chest: Effort normal and breath sounds normal. No respiratory distress.  Abdominal: Soft. She exhibits no distension. There is tenderness. There is no rebound and no guarding.  Diffusely tender to palpation, mild   Musculoskeletal: She exhibits no edema.  Neurological: She is alert.  Skin: Skin is warm and dry.  Psychiatric: She has a normal mood and affect.  Nursing note and vitals reviewed.    ED Treatments / Results  Labs (all labs ordered are listed, but only abnormal results are displayed) Labs Reviewed  URINALYSIS, ROUTINE W REFLEX MICROSCOPIC (NOT AT [facility])  PREGNANCY, URINE  CBC WITH DIFFERENTIAL/PLATELET  COMPREHENSIVE METABOLIC PANEL  LIPASE, BLOOD    EKG  EKG Interpretation None       Radiology No results found.  Procedures Procedures (including critical care time)  Medications Ordered in ED Medications - No data to display   Initial Impression / Assessment and Plan / ED Course  I have reviewed the triage vital signs and the nursing notes.  Pertinent labs & imaging results that  were available during my care of the patient were reviewed by me and considered in my medical decision making (see chart for details).  Clinical Course    Nausea/Vomitting: Likely a side effect from her new doxycyline prescription for acute sinusitis. Patient instructed to stop taking (2 days left). Received 1/2 L NS bolus and 4 mg IV Zofran for nausea with improvement in symptoms. Given po zofran prn for home. Instructed to f/u with PCP to ensure resolution of symptoms.   Final Clinical Impressions(s) / ED Diagnoses   Final diagnoses:  None    New Prescriptions New Prescriptions   No medications on file     Reymundo Poll, MD 10/26/15 1452    Gwyneth Sprout, MD 11/04/15 2131

## 2015-10-26 NOTE — ED Triage Notes (Signed)
Patient states she woke up with a upset stomach this morning.  Took her meds on an empty stomach, and ate about 15 minutes later.  States she developed vomiting which lasted approximately 20 minutes.  Hx of HHT and a history of heavy menstrual bleeding s/p uterine ablation and frequent nose bleeds.

## 2015-10-26 NOTE — Discharge Instructions (Signed)
Please stop taking taking your doxycycline. Take Zofran every 8 hours as needed for nausea. If symptoms worsen or persist, please return for further work up.

## 2015-10-26 NOTE — ED Notes (Signed)
Pt made aware to return if symptoms worsen or if any life threatening symptoms occur.   

## 2016-07-15 ENCOUNTER — Encounter (HOSPITAL_BASED_OUTPATIENT_CLINIC_OR_DEPARTMENT_OTHER): Payer: Self-pay

## 2016-07-15 ENCOUNTER — Emergency Department (HOSPITAL_BASED_OUTPATIENT_CLINIC_OR_DEPARTMENT_OTHER)
Admission: EM | Admit: 2016-07-15 | Discharge: 2016-07-16 | Disposition: A | Discharge status: Home / Self Care | Payer: 59 | Attending: Emergency Medicine | Admitting: Emergency Medicine

## 2016-07-15 ENCOUNTER — Emergency Department (HOSPITAL_BASED_OUTPATIENT_CLINIC_OR_DEPARTMENT_OTHER): Payer: 59

## 2016-07-15 DIAGNOSIS — R0602 Shortness of breath: Secondary | ICD-10-CM | POA: Diagnosis present

## 2016-07-15 DIAGNOSIS — J4521 Mild intermittent asthma with (acute) exacerbation: Secondary | ICD-10-CM | POA: Diagnosis not present

## 2016-07-15 DIAGNOSIS — R062 Wheezing: Secondary | ICD-10-CM

## 2016-07-15 DIAGNOSIS — I10 Essential (primary) hypertension: Secondary | ICD-10-CM | POA: Diagnosis not present

## 2016-07-15 MED ORDER — PREDNISONE 10 MG PO TABS
ORAL_TABLET | ORAL | Status: AC
  Filled 2016-07-15: qty 1

## 2016-07-15 MED ORDER — IPRATROPIUM-ALBUTEROL 0.5-2.5 (3) MG/3ML IN SOLN
3.0000 mL | Freq: Once | RESPIRATORY_TRACT | Status: AC
  Administered 2016-07-15: 3 mL via RESPIRATORY_TRACT
  Filled 2016-07-15: qty 3

## 2016-07-15 MED ORDER — ALBUTEROL SULFATE (2.5 MG/3ML) 0.083% IN NEBU
2.5000 mg | INHALATION_SOLUTION | Freq: Once | RESPIRATORY_TRACT | Status: AC
  Administered 2016-07-15: 2.5 mg via RESPIRATORY_TRACT
  Filled 2016-07-15: qty 3

## 2016-07-15 MED ORDER — PREDNISONE 50 MG PO TABS
60.0000 mg | ORAL_TABLET | Freq: Once | ORAL | Status: AC
  Administered 2016-07-15: 23:00:00 60 mg via ORAL
  Filled 2016-07-15: qty 1

## 2016-07-15 MED ORDER — ALBUTEROL SULFATE HFA 108 (90 BASE) MCG/ACT IN AERS
2.0000 | INHALATION_SPRAY | RESPIRATORY_TRACT | Status: DC | PRN
  Administered 2016-07-15: 2 via RESPIRATORY_TRACT
  Filled 2016-07-15: qty 6.7

## 2016-07-15 NOTE — ED Triage Notes (Signed)
Pt started wheezing and becoming SOB tonight after boyfriend started burning incense.  Pt has a hx of asthma, usually has an attack during allergy season, tried nebulizer at home without relief.  Pt c/o chest tightness, has diminished breath sounds with inspiratory wheeze.

## 2016-07-15 NOTE — ED Provider Notes (Signed)
[facility] Provider Note   CSN: [id number] Arrival date & time: 07/15/16  2202   By signing my name below, I, Clarisse Gouge, attest that this documentation has been prepared under the direction and in the presence of [facility], PA-C. Electronically Signed: Clarisse Gouge, Scribe. 07/15/16. 11:18 PM.   History   Chief Complaint Chief Complaint  Patient presents with  . Shortness of Breath   The history is provided by the patient and medical records. No language interpreter was used.    Tanya Herrera is a 30 y.o. female with asthma, environmental allergies, HTN who presents to the Emergency Department with concern for gradual onset SOB x 2-3 days. Patient states the pollen has made her allergies act up for the last 2-3 days. Coughing a bit more than usual (productive, clear sputum) and intermittently wheezing. This evening, significant other started burning incense and this acutely worsened her wheezing / shortness of breath. Her chest also started getting tight.  Pt states she attempted treatment with her albuterol inhaler and her son's nebulizer without relief. No fever or any other complaints noted at this time.  Past Medical History:  Diagnosis Date  . Asthma   . H/O transfusion of packed red blood cells   . Hemophilia (HCC)   . HHT (hereditary hemorrhagic telangiectasia) (HCC)   . Hypertension     There are no active problems to display for this patient.   Past Surgical History:  Procedure Laterality Date  . ABDOMINAL HYSTERECTOMY    . CESAREAN SECTION     x 3  . DILATION AND CURETTAGE OF UTERUS    . ENDOMETRIAL ABLATION    . NOSE SURGERY    . TUBAL LIGATION      OB History    No data available       Home Medications    Prior to Admission medications   Medication Sig Start Date End Date Taking? Authorizing Provider  acetaminophen (TYLENOL) 500 MG tablet Take 500 mg by mouth every 6 (six) hours as needed.    Historical Provider, MD  albuterol  (PROVENTIL HFA;VENTOLIN HFA) 108 (90 BASE) MCG/ACT inhaler Inhale 2 puffs into the lungs every 6 (six) hours as needed for wheezing or shortness of breath.    Historical Provider, MD  ALPRAZolam (XANAX PO) Take by mouth.    Historical Provider, MD  Citalopram Hydrobromide (CELEXA PO) Take by mouth.    Historical Provider, MD  clindamycin (CLEOCIN) 150 MG capsule Take 3 capsules (450 mg total) by mouth 3 (three) times daily. 09/10/15   Hannah Muthersbaugh, PA-C  HYDROcodone-acetaminophen (NORCO/VICODIN) 5-325 MG tablet Take 1-2 tablets by mouth every 6 (six) hours as needed for moderate pain or severe pain. 09/10/15   Hannah Muthersbaugh, PA-C  LOSARTAN POTASSIUM PO Take by mouth.    Historical Provider, MD  meclizine (ANTIVERT) 25 MG tablet Take 1 tablet (25 mg total) by mouth 3 (three) times daily as needed. 04/11/15   Rolland Porter, MD  Montelukast Sodium (SINGULAIR PO) Take by mouth.    Historical Provider, MD  ondansetron (ZOFRAN) 4 MG tablet Take 1 tablet (4 mg total) by mouth every 8 (eight) hours as needed for nausea or vomiting. 10/26/15   Reymundo Poll, MD  OVER THE COUNTER MEDICATION OTC Migraine medications    Historical Provider, MD  phentermine 37.5 MG capsule Take 37.5 mg by mouth every morning.    Historical Provider, MD  predniSONE (DELTASONE) 20 MG tablet Take 2 tablets (40 mg total) by mouth  daily. 12/06/14   Renne Crigler, PA-C    Family History No family history on file.  Social History Social History  Substance Use Topics  . Smoking status: Never Smoker  . Smokeless tobacco: Never Used  . Alcohol use Yes     Comment: occassionally     Allergies   Iron; Ibuprofen; and Penicillins   Review of Systems Review of Systems  Constitutional: Negative for fever.  HENT: Positive for congestion.   Respiratory: Positive for cough, shortness of breath and wheezing.   All other systems reviewed and are negative.    Physical Exam Updated Vital Signs BP 118/75 (BP Location:  Right Arm)   Pulse (!) 108   Temp 98 F (36.7 C) (Oral)   Resp (!) 28   Ht  (1.651 m)   Wt 200 lb (90.7 kg)   LMP 10/19/2015   SpO2 100%   BMI 33.28 kg/m   Physical Exam  Constitutional: She is oriented to person, place, and time. She appears well-developed and well-nourished. No distress.  HENT:  Head: Normocephalic and atraumatic.  Cardiovascular: Normal rate, regular rhythm and normal heart sounds.   No murmur heard. Pulmonary/Chest: Effort normal. No respiratory distress. She has wheezes.  Inspiratory and expiratory wheezing, able to speak in full sentences without difficulty  Abdominal: Soft. She exhibits no distension. There is no tenderness.  Musculoskeletal: She exhibits no edema.  Neurological: She is alert and oriented to person, place, and time.  Skin: Skin is warm and dry.  Nursing note and vitals reviewed.    ED Treatments / Results  DIAGNOSTIC STUDIES: Oxygen Saturation is 100% on RA, NL by my interpretation.    COORDINATION OF CARE: 11:17 PM-Discussed next steps with pt. Pt verbalized understanding and is agreeable with the plan. Will order imaging, medications and reassess.   Labs (all labs ordered are listed, but only abnormal results are displayed) Labs Reviewed - No data to display  EKG  EKG Interpretation None       Radiology No results found.  Procedures Procedures (including critical care time)  Medications Ordered in ED Medications  predniSONE (DELTASONE) tablet 60 mg (not administered)  albuterol (PROVENTIL) (2.5 MG/3ML) 0.083% nebulizer solution 2.5 mg (2.5 mg Nebulization Given 07/15/16 2221)  ipratropium-albuterol (DUONEB) 0.5-2.5 (3) MG/3ML nebulizer solution 3 mL (3 mLs Nebulization Given 07/15/16 2221)  albuterol (PROVENTIL) (2.5 MG/3ML) 0.083% nebulizer solution 2.5 mg (2.5 mg Nebulization Given 07/15/16 2245)  albuterol (PROVENTIL) (2.5 MG/3ML) 0.083% nebulizer solution 2.5 mg (2.5 mg Nebulization Given 07/15/16 2305)      Initial Impression / Assessment and Plan / ED Course  I have reviewed the triage vital signs and the nursing notes.  Pertinent labs & imaging results that were available during my care of the patient were reviewed by me and considered in my medical decision making (see chart for details).      Final Clinical Impressions(s) / ED Diagnoses   Final diagnoses:  Wheezing  Tanya Herrera is a 31 y.o. female who presents to ED for wheezing, shortness of breath. Duoneb x 2 given by respiratory prior to my evaluation. Still wheezing on exam but patient states she feels much better. Another neb and prednisone ordered.   CXR negative. Patient re-evaluated after 3rd neb and wheezing nearly resolved. Patient feels comfortable with discharge home. Inhaler for home given in ER. Rx for steroid burst given. Patient understands to return if symptoms worsen. All questions answered.    New Prescriptions New Prescriptions   No  medications on file   I personally performed the services described in this documentation, which was scribed in my presence. The recorded information has been reviewed and is accurate.    [facility] Ciro Tashiro, PA-C 07/16/16 1610    Tilden Fossa, MD 07/16/16 2026

## 2016-07-16 MED ORDER — PREDNISONE 20 MG PO TABS: 40.0000 mg | ORAL_TABLET | Freq: Every day | ORAL | 0 refills | Status: DC

## 2016-07-16 NOTE — Discharge Instructions (Signed)
Albuterol inhaler as needed. Take prednisone daily starting tomorrow.  Follow-up with your primary care provider for discussion of today's visit and recheck of symptoms.  Return to ER for difficulty breathing, new or worsening symptoms, any additional concerns.

## 2016-07-25 ENCOUNTER — Emergency Department (HOSPITAL_BASED_OUTPATIENT_CLINIC_OR_DEPARTMENT_OTHER)
Admission: EM | Admit: 2016-07-25 | Discharge: 2016-07-25 | Disposition: A | Discharge status: Home / Self Care | Payer: 59 | Attending: Emergency Medicine | Admitting: Emergency Medicine

## 2016-07-25 ENCOUNTER — Encounter (HOSPITAL_BASED_OUTPATIENT_CLINIC_OR_DEPARTMENT_OTHER): Payer: Self-pay | Admitting: Emergency Medicine

## 2016-07-25 DIAGNOSIS — I10 Essential (primary) hypertension: Secondary | ICD-10-CM | POA: Diagnosis not present

## 2016-07-25 DIAGNOSIS — J45909 Unspecified asthma, uncomplicated: Secondary | ICD-10-CM | POA: Diagnosis not present

## 2016-07-25 DIAGNOSIS — M7989 Other specified soft tissue disorders: Secondary | ICD-10-CM | POA: Insufficient documentation

## 2016-07-25 DIAGNOSIS — M79631 Pain in right forearm: Secondary | ICD-10-CM

## 2016-07-25 DIAGNOSIS — Z79899 Other long term (current) drug therapy: Secondary | ICD-10-CM | POA: Insufficient documentation

## 2016-07-25 MED ORDER — FAMOTIDINE 20 MG PO TABS
20.0000 mg | ORAL_TABLET | Freq: Once | ORAL | Status: AC
  Administered 2016-07-25: 20 mg via ORAL
  Filled 2016-07-25: qty 1

## 2016-07-25 MED ORDER — IBUPROFEN 600 MG PO TABS: 600.0000 mg | ORAL_TABLET | Freq: Four times a day (QID) | ORAL | 0 refills | Status: DC | PRN

## 2016-07-25 MED ORDER — CLINDAMYCIN HCL 300 MG PO CAPS: 300.0000 mg | ORAL_CAPSULE | Freq: Four times a day (QID) | ORAL | 0 refills | Status: DC

## 2016-07-25 MED ORDER — DIPHENHYDRAMINE HCL 25 MG PO CAPS
25.0000 mg | ORAL_CAPSULE | Freq: Once | ORAL | Status: AC
  Administered 2016-07-25: 25 mg via ORAL
  Filled 2016-07-25: qty 1

## 2016-07-25 MED ORDER — DIPHENHYDRAMINE HCL 25 MG PO TABS: 25.0000 mg | ORAL_TABLET | Freq: Four times a day (QID) | ORAL | 0 refills | Status: DC | PRN

## 2016-07-25 MED ORDER — METHYLPREDNISOLONE SODIUM SUCC 125 MG IJ SOLR
125.0000 mg | Freq: Once | INTRAMUSCULAR | Status: AC
  Administered 2016-07-25: 125 mg via INTRAMUSCULAR
  Filled 2016-07-25: qty 2

## 2016-07-25 MED ORDER — IBUPROFEN 400 MG PO TABS
600.0000 mg | ORAL_TABLET | Freq: Once | ORAL | Status: AC
  Administered 2016-07-25: 11:00:00 600 mg via ORAL
  Filled 2016-07-25: qty 1

## 2016-07-25 MED FILL — BANOPHEN 25 MG CAPSULE: 25 | 25 days supply | Qty: 100 | Fill #0

## 2016-07-25 MED FILL — IBUPROFEN 600 MG TABLET: 600 | 8 days supply | Qty: 30 | Fill #0

## 2016-07-25 MED FILL — CLINDAMYCIN HCL 300 MG CAPS: 300 | 7 days supply | Qty: 28 | Fill #0

## 2016-07-25 NOTE — ED Triage Notes (Signed)
Pt has large, reddened, area that is hot to touch to L forearm. Pt states it hurts and itches. Pt is unsure what bit her.

## 2016-07-25 NOTE — ED Provider Notes (Signed)
[facility]-EMER[facility]ENCY DEPT Provider Note   CSN: [id number] Arrival date & time: 07/25/16  4098     History   Chief Complaint Chief Complaint  Patient presents with  . Insect Bite    HPI Tanya Herrera is a 31 y.o. female.  HPI Patient presents with swelling and redness to her left forearm. States she was bitten by an insect 2 days ago. Had a small papule at the site. This morning she woke with surrounding redness and itching to the area. This has since spread. She's having some tingling to her fingers. Denies any fever or chills. Past Medical History:  Diagnosis Date  . Asthma   . H/O transfusion of packed red blood cells   . Hemophilia (HCC)   . HHT (hereditary hemorrhagic telangiectasia) (HCC)   . Hypertension     There are no active problems to display for this patient.   Past Surgical History:  Procedure Laterality Date  . ABDOMINAL HYSTERECTOMY    . CESAREAN SECTION     x 3  . DILATION AND CURETTAGE OF UTERUS    . ENDOMETRIAL ABLATION    . NOSE SURGERY    . TUBAL LIGATION      OB History    No data available       Home Medications    Prior to Admission medications   Medication Sig Start Date End Date Taking? Authorizing Provider  Citalopram Hydrobromide (CELEXA PO) Take by mouth.   Yes [provider]  Montelukast Sodium (SINGULAIR PO) Take by mouth.   Yes [provider]  acetaminophen (TYLENOL) 500 MG tablet Take 500 mg by mouth every 6 (six) hours as needed.    [provider]  albuterol (PROVENTIL HFA;VENTOLIN HFA) 108 (90 BASE) MCG/ACT inhaler Inhale 2 puffs into the lungs every 6 (six) hours as needed for wheezing or shortness of breath.    [provider]  ALPRAZolam (XANAX PO) Take by mouth.    [provider]  clindamycin (CLEOCIN) 300 MG capsule Take 1 capsule (300 mg total) by mouth 4 (four) times daily. 07/25/16   Loren Racer, MD  diphenhydrAMINE (BENADRYL) 25 MG tablet Take 1 tablet (25 mg total) by  mouth every 6 (six) hours as needed for itching. 07/25/16   Loren Racer, MD  HYDROcodone-acetaminophen (NORCO/VICODIN) 5-325 MG tablet Take 1-2 tablets by mouth every 6 (six) hours as needed for moderate pain or severe pain. 09/10/15   Muthersbaugh, Dahlia Client, PA-C  ibuprofen (ADVIL,MOTRIN) 600 MG tablet Take 1 tablet (600 mg total) by mouth every 6 (six) hours as needed. 07/25/16   Loren Racer, MD  LOSARTAN POTASSIUM PO Take by mouth.    [provider]  meclizine (ANTIVERT) 25 MG tablet Take 1 tablet (25 mg total) by mouth 3 (three) times daily as needed. 04/11/15   Rolland Porter, MD  ondansetron (ZOFRAN) 4 MG tablet Take 1 tablet (4 mg total) by mouth every 8 (eight) hours as needed for nausea or vomiting. 10/26/15   Reymundo Poll, MD  OVER THE COUNTER MEDICATION OTC Migraine medications    [provider]  phentermine 37.5 MG capsule Take 37.5 mg by mouth every morning.    [provider]  predniSONE (DELTASONE) 20 MG tablet Take 2 tablets (40 mg total) by mouth daily. 07/16/16   Ward, Chase Picket, PA-C    Family History No family history on file.  Social History Social History  Substance Use Topics  . Smoking status: Never Smoker  . Smokeless tobacco: Never  Used  . Alcohol use Yes     Comment: occassionally     Allergies   Iron and Penicillins   Review of Systems Review of Systems  Constitutional: Negative for chills and fever.  HENT: Negative for facial swelling.   Respiratory: Negative for shortness of breath.   Cardiovascular: Negative for leg swelling.  Gastrointestinal: Negative for nausea and vomiting.  Musculoskeletal: Negative for arthralgias and joint swelling.  Skin: Positive for color change and rash.  Neurological: Positive for numbness. Negative for weakness.  All other systems reviewed and are negative.    Physical Exam Updated Vital Signs BP 120/82 (BP Location: Left Arm)   Pulse 68   Temp 99 F (37.2 C) (Oral)   Resp 18    Ht 5\' 5"  (1.651 m)   Wt 203 lb (92.1 kg)   LMP 10/19/2015   SpO2 95%   BMI 33.78 kg/m   Physical Exam  Constitutional: She is oriented to person, place, and time. She appears well-developed and well-nourished. No distress.  HENT:  Head: Normocephalic and atraumatic.  Mouth/Throat: Oropharynx is clear and moist.  Eyes: EOM are normal. Pupils are equal, round, and reactive to light.  Neck: Normal range of motion. Neck supple.  Cardiovascular: Normal rate and regular rhythm.   Pulmonary/Chest: Effort normal and breath sounds normal.  Abdominal: Soft.  Musculoskeletal: Normal range of motion. She exhibits tenderness. She exhibits no edema.  Neurological: She is alert and oriented to person, place, and time.  Moving all extremities without deficit. Paresthesias in the ulnar distribution of the left hand. Good distal cap refill. Distal pulses intact.  Skin: Skin is warm and dry. No rash noted. No erythema.  Patient with raised, erythematous, well-demarcated plaque to the rightt forearm. Central papule with surrounding induration. Mild tenderness to palpation.  Psychiatric: She has a normal mood and affect. Her behavior is normal.  Nursing note and vitals reviewed.    ED Treatments / Results  Labs (all labs ordered are listed, but only abnormal results are displayed) Labs Reviewed - No data to display  EKG  EKG Interpretation None       Radiology No results found.  Procedures Procedures (including critical care time)  Medications Ordered in ED Medications  methylPREDNISolone sodium succinate (SOLU-MEDROL) 125 mg/2 mL injection 125 mg (125 mg Intramuscular Given 07/25/16 0916)  diphenhydrAMINE (BENADRYL) capsule 25 mg (25 mg Oral Given 07/25/16 0915)  famotidine (PEPCID) tablet 20 mg (20 mg Oral Given 07/25/16 0915)  ibuprofen (ADVIL,MOTRIN) tablet 600 mg (600 mg Oral Given 07/25/16 1030)     Initial Impression / Assessment and Plan / ED Course  I have reviewed the triage  vital signs and the nursing notes.  Pertinent labs & imaging results that were available during my care of the patient were reviewed by me and considered in my medical decision making (see chart for details).     Question cellulitis versus localized allergic reaction. Skin marked. Will treat for allergic reaction and reassess.  Redness and induration has significantly improved. No fluid collection demonstrated with bedside ultrasound.   Persistent redness though improved. She does have some tenderness over the site. Given recent soft tissue infection, will give round of antibiotics to cover for possible cellulitis. Instructed to return immediately for any worsening of her symptoms, fever, chills or for any concerns. Final Clinical Impressions(s) / ED Diagnoses   Final diagnoses:  Pain and swelling of forearm, right    New Prescriptions Discharge Medication List as of 07/25/2016 11:31  AM    START taking these medications   Details  diphenhydrAMINE (BENADRYL) 25 MG tablet Take 1 tablet (25 mg total) by mouth every 6 (six) hours as needed for itching., Starting Fri 07/25/2016, Print    ibuprofen (ADVIL,MOTRIN) 600 MG tablet Take 1 tablet (600 mg total) by mouth every 6 (six) hours as needed., Starting Fri 07/25/2016, Print         Loren RacerYelverton, Barbara Keng, MD 07/27/16 [phone]

## 2016-07-25 NOTE — Discharge Instructions (Signed)
Return immediately for worsening pain, increased redness, fever, chills or for any concerns. Follow-up with your primary physician.

## 2016-07-25 NOTE — ED Notes (Signed)
ED Provider at bedside. 

## 2016-09-18 ENCOUNTER — Emergency Department (HOSPITAL_COMMUNITY)
Admission: EM | Admit: 2016-09-18 | Discharge: 2016-09-18 | Disposition: A | Discharge status: Home / Self Care | Payer: 59 | Attending: Emergency Medicine | Admitting: Emergency Medicine

## 2016-09-18 DIAGNOSIS — R112 Nausea with vomiting, unspecified: Secondary | ICD-10-CM | POA: Diagnosis not present

## 2016-09-18 DIAGNOSIS — K0889 Other specified disorders of teeth and supporting structures: Secondary | ICD-10-CM | POA: Insufficient documentation

## 2016-09-18 DIAGNOSIS — R42 Dizziness and giddiness: Secondary | ICD-10-CM | POA: Diagnosis not present

## 2016-09-18 DIAGNOSIS — J45909 Unspecified asthma, uncomplicated: Secondary | ICD-10-CM | POA: Insufficient documentation

## 2016-09-18 DIAGNOSIS — Z79899 Other long term (current) drug therapy: Secondary | ICD-10-CM | POA: Diagnosis not present

## 2016-09-18 DIAGNOSIS — I10 Essential (primary) hypertension: Secondary | ICD-10-CM | POA: Insufficient documentation

## 2016-09-18 MED ORDER — MECLIZINE HCL 25 MG PO TABS: 25.0000 mg | ORAL_TABLET | Freq: Three times a day (TID) | ORAL | 0 refills | Status: DC | PRN

## 2016-09-18 MED ORDER — ACETAMINOPHEN 500 MG PO TABS
1000.0000 mg | ORAL_TABLET | Freq: Once | ORAL | Status: AC
  Administered 2016-09-18: 1000 mg via ORAL
  Filled 2016-09-18: qty 2

## 2016-09-18 MED ORDER — ONDANSETRON 4 MG PO TBDP: 4.0000 mg | ORAL_TABLET | Freq: Three times a day (TID) | ORAL | 0 refills | Status: DC | PRN

## 2016-09-18 MED ORDER — MECLIZINE HCL 25 MG PO TABS
25.0000 mg | ORAL_TABLET | Freq: Once | ORAL | Status: AC
  Administered 2016-09-18: 25 mg via ORAL
  Filled 2016-09-18: qty 1

## 2016-09-18 MED ORDER — ONDANSETRON 8 MG PO TBDP
8.0000 mg | ORAL_TABLET | Freq: Once | ORAL | Status: AC
  Administered 2016-09-18: 8 mg via ORAL
  Filled 2016-09-18: qty 1

## 2016-09-18 MED ORDER — CLINDAMYCIN HCL 150 MG PO CAPS: 300.0000 mg | ORAL_CAPSULE | Freq: Three times a day (TID) | ORAL | 0 refills | Status: AC

## 2016-09-18 NOTE — ED Provider Notes (Signed)
[facility]-EMERGENCY DEPT Provider Note   CSN: [id number] Arrival date & time: 09/18/16  1029     History   Chief Complaint Chief Complaint  Patient presents with  . Dizziness    HPI Tanya Herrera is a 31 y.o. female.  31yo F w/ PMH including hemophilia, HHT, HTN who p/w dizziness, nausea and vomiting, and tooth pain. She woke up feeling dizzy this morning but went to work anyway. She got to work and tried to eat breakfast but then began vomiting. She also got lightheaded at that time and felt like she was going to pass out. She describes dizziness as room-spinning sensation. She she turns her head to the right she feels a shooting pain in her left face. No focal weakness or balance problems.  She notes severe L lower tooth pain that radiates into her L ear. This has been going on for several days and keeping her awake. She feels like all of her symptoms have developed because of the tooth pain. She is placed a temporary fill her on the tooth that she bought over-the-counter until she can follow-up with a dentist. No facial swelling. No fevers or urinary symptoms. BG 96 by EMS.    The history is provided by the patient.  Dizziness    Past Medical History:  Diagnosis Date  . Asthma   . H/O transfusion of packed red blood cells   . Hemophilia (HCC)   . HHT (hereditary hemorrhagic telangiectasia) (HCC)   . Hypertension     There are no active problems to display for this patient.   Past Surgical History:  Procedure Laterality Date  . ABDOMINAL HYSTERECTOMY    . CESAREAN SECTION     x 3  . DILATION AND CURETTAGE OF UTERUS    . ENDOMETRIAL ABLATION    . NOSE SURGERY    . TUBAL LIGATION      OB History    No data available       Home Medications    Prior to Admission medications   Medication Sig Start Date End Date Taking? Authorizing Provider  acetaminophen (TYLENOL) 500 MG tablet Take 500 mg by mouth every 6 (six) hours as needed.    [provider]    albuterol (PROVENTIL HFA;VENTOLIN HFA) 108 (90 BASE) MCG/ACT inhaler Inhale 2 puffs into the lungs every 6 (six) hours as needed for wheezing or shortness of breath.    [provider]  ALPRAZolam (XANAX PO) Take by mouth.    [provider]  Citalopram Hydrobromide (CELEXA PO) Take by mouth.    [provider]  clindamycin (CLEOCIN) 150 MG capsule Take 2 capsules (300 mg total) by mouth 3 (three) times daily. 09/18/16 09/25/16  Little, Ambrose Finland, MD  diphenhydrAMINE (BENADRYL) 25 MG tablet Take 1 tablet (25 mg total) by mouth every 6 (six) hours as needed for itching. 07/25/16   Loren Racer, MD  HYDROcodone-acetaminophen (NORCO/VICODIN) 5-325 MG tablet Take 1-2 tablets by mouth every 6 (six) hours as needed for moderate pain or severe pain. 09/10/15   Muthersbaugh, Dahlia Client, PA-C  ibuprofen (ADVIL,MOTRIN) 600 MG tablet Take 1 tablet (600 mg total) by mouth every 6 (six) hours as needed. 07/25/16   Loren Racer, MD  LOSARTAN POTASSIUM PO Take by mouth.    [provider]  meclizine (ANTIVERT) 25 MG tablet Take 1 tablet (25 mg total) by mouth 3 (three) times daily as needed for dizziness. 09/18/16   Little, Ambrose Finland, MD  Montelukast Sodium (SINGULAIR PO)  Take by mouth.    [provider]  ondansetron (ZOFRAN ODT) 4 MG disintegrating tablet Take 1 tablet (4 mg total) by mouth every 8 (eight) hours as needed for nausea or vomiting. 09/18/16   Little, Ambrose Finland, MD  ondansetron (ZOFRAN) 4 MG tablet Take 1 tablet (4 mg total) by mouth every 8 (eight) hours as needed for nausea or vomiting. 10/26/15   Reymundo Poll, MD  OVER THE COUNTER MEDICATION OTC Migraine medications    [provider]  phentermine 37.5 MG capsule Take 37.5 mg by mouth every morning.    [provider]  predniSONE (DELTASONE) 20 MG tablet Take 2 tablets (40 mg total) by mouth daily. 07/16/16   Ward, Chase Picket, PA-C    Family History No family history on  file.  Social History Social History  Substance Use Topics  . Smoking status: Never Smoker  . Smokeless tobacco: Never Used  . Alcohol use Yes     Comment: occassionally     Allergies   Iron and Penicillins   Review of Systems Review of Systems  Neurological: Positive for dizziness.  All other systems reviewed and are negative except that which was mentioned in HPI    Physical Exam Updated Vital Signs BP (!) 120/96   Pulse 90   Temp 97.9 F (36.6 C) (Oral)   Resp 15   Ht 5\' 5"  (1.651 m)   Wt 95.3 kg (210 lb)   LMP 10/19/2015   SpO2 100%   BMI 34.95 kg/m   Physical Exam  Constitutional: She is oriented to person, place, and time. She appears well-developed and well-nourished. No distress.  Awake, alert  HENT:  Head: Normocephalic and atraumatic.  Mouth/Throat:    Eyes: Conjunctivae and EOM are normal. Pupils are equal, round, and reactive to light.  Neck: Neck supple.  Cardiovascular: Normal rate, regular rhythm and normal heart sounds.   No murmur heard. Pulmonary/Chest: Effort normal and breath sounds normal. No respiratory distress.  Abdominal: Soft. Bowel sounds are normal. She exhibits no distension. There is no tenderness.  Musculoskeletal: She exhibits no edema.  Neurological: She is alert and oriented to person, place, and time. She has normal reflexes. No cranial nerve deficit. She exhibits normal muscle tone.  Fluent speech, normal finger-to-nose testing, negative pronator drift, no clonus 5/5 strength and normal sensation x all 4 extremities  Skin: Skin is warm and dry.  Psychiatric: She has a normal mood and affect. Judgment and thought content normal.  Nursing note and vitals reviewed.    ED Treatments / Results  Labs (all labs ordered are listed, but only abnormal results are displayed) Labs Reviewed - No data to display  EKG  EKG Interpretation None       Radiology No results found.  Procedures Procedures (including critical  care time)  Medications Ordered in ED Medications  ondansetron (ZOFRAN-ODT) disintegrating tablet 8 mg (8 mg Oral Given 09/18/16 1204)  meclizine (ANTIVERT) tablet 25 mg (25 mg Oral Given 09/18/16 1203)  acetaminophen (TYLENOL) tablet 1,000 mg (1,000 mg Oral Given 09/18/16 1203)     Initial Impression / Assessment and Plan / ED Course  I have reviewed the triage vital signs and the nursing notes.     PT w/ Total days of left lower tooth pain and now dizziness and one episode of vomiting. She was neurologically intact on exam with normal vital signs. She did have a broken tooth as noted in above physical exam. I suspect this is the  source of her pain. She has no concerning neurologic symptoms to suggest intracranial cause of her dizziness. After receiving Zofran, meclizine, and Tylenol she was well-appearing, tolerating water, and stating that her symptoms had all resolved. Gave her clindamycin for tooth and emphasized the importance of close follow-up with dentist. Extensively reviewed return precautions. Patient voiced understanding was discharged in satisfactory condition.  Final Clinical Impressions(s) / ED Diagnoses   Final diagnoses:  Pain, dental  Dizziness  Non-intractable vomiting with nausea, unspecified vomiting type    New Prescriptions Discharge Medication List as of 09/18/2016  1:50 PM    START taking these medications   Details  ondansetron (ZOFRAN ODT) 4 MG disintegrating tablet Take 1 tablet (4 mg total) by mouth every 8 (eight) hours as needed for nausea or vomiting., Starting Thu 09/18/2016, Print         Little, Ambrose Finlandachel Morgan, MD 09/18/16 1616

## 2016-09-18 NOTE — ED Notes (Signed)
ED Provider at bedside. 

## 2016-09-18 NOTE — ED Notes (Signed)
Patient given water

## 2016-09-18 NOTE — ED Triage Notes (Signed)
Patient is alert and oriented x4.  She was picked up at work with complaints nausea and vomiting with dizziness.  She adds that she was also seeing spots.  Patient denies any pain.

## 2017-01-17 ENCOUNTER — Encounter (HOSPITAL_BASED_OUTPATIENT_CLINIC_OR_DEPARTMENT_OTHER): Payer: Self-pay | Admitting: Emergency Medicine

## 2017-01-17 ENCOUNTER — Emergency Department (HOSPITAL_BASED_OUTPATIENT_CLINIC_OR_DEPARTMENT_OTHER): Payer: 59

## 2017-01-17 ENCOUNTER — Emergency Department (HOSPITAL_BASED_OUTPATIENT_CLINIC_OR_DEPARTMENT_OTHER)
Admission: EM | Admit: 2017-01-17 | Discharge: 2017-01-17 | Disposition: A | Discharge status: Home / Self Care | Payer: 59 | Attending: Emergency Medicine | Admitting: Emergency Medicine

## 2017-01-17 DIAGNOSIS — I10 Essential (primary) hypertension: Secondary | ICD-10-CM | POA: Insufficient documentation

## 2017-01-17 DIAGNOSIS — R0602 Shortness of breath: Secondary | ICD-10-CM | POA: Diagnosis present

## 2017-01-17 DIAGNOSIS — Z79899 Other long term (current) drug therapy: Secondary | ICD-10-CM | POA: Insufficient documentation

## 2017-01-17 DIAGNOSIS — R062 Wheezing: Secondary | ICD-10-CM

## 2017-01-17 DIAGNOSIS — J45901 Unspecified asthma with (acute) exacerbation: Secondary | ICD-10-CM | POA: Insufficient documentation

## 2017-01-17 LAB — CBC WITH DIFFERENTIAL/PLATELET
Basophils Absolute: 0 10*3/uL (ref 0.0–0.1)
Basophils Relative: 0 %
Eosinophils Absolute: 0.8 10*3/uL — ABNORMAL HIGH (ref 0.0–0.7)
Eosinophils Relative: 10 %
HCT: 41.1 % (ref 36.0–46.0)
Hemoglobin: 14.6 g/dL (ref 12.0–15.0)
Lymphocytes Relative: 27 %
Lymphs Abs: 2 10*3/uL (ref 0.7–4.0)
MCH: 29.2 pg (ref 26.0–34.0)
MCHC: 35.5 g/dL (ref 30.0–36.0)
MCV: 82.2 fL (ref 78.0–100.0)
Monocytes Absolute: 0.4 10*3/uL (ref 0.1–1.0)
Monocytes Relative: 5 %
Neutro Abs: 4.1 10*3/uL (ref 1.7–7.7)
Neutrophils Relative %: 58 %
Platelets: 182 10*3/uL (ref 150–400)
RBC: 5 MIL/uL (ref 3.87–5.11)
RDW: 13.6 % (ref 11.5–15.5)
WBC: 7.3 10*3/uL (ref 4.0–10.5)

## 2017-01-17 LAB — BASIC METABOLIC PANEL
Anion gap: 8 (ref 5–15)
BUN: 7 mg/dL (ref 6–20)
CO2: 24 mmol/L (ref 22–32)
Calcium: 9.7 mg/dL (ref 8.9–10.3)
Chloride: 102 mmol/L (ref 101–111)
Creatinine, Ser: 0.77 mg/dL (ref 0.44–1.00)
GFR calc Af Amer: >60 mL/min (ref >60)
GFR calc non Af Amer: >60 mL/min (ref >60)
Glucose, Bld: 107 mg/dL — ABNORMAL HIGH (ref 65–99)
Potassium: 3.4 mmol/L — ABNORMAL LOW (ref 3.5–5.1)
Sodium: 134 mmol/L — ABNORMAL LOW (ref 135–145)

## 2017-01-17 MED ORDER — ALBUTEROL SULFATE (2.5 MG/3ML) 0.083% IN NEBU
INHALATION_SOLUTION | RESPIRATORY_TRACT | Status: AC
  Administered 2017-01-17: 2.5 mg via RESPIRATORY_TRACT
  Filled 2017-01-17: qty 3

## 2017-01-17 MED ORDER — IPRATROPIUM-ALBUTEROL 0.5-2.5 (3) MG/3ML IN SOLN
RESPIRATORY_TRACT | Status: AC
  Administered 2017-01-17: 3 mL via RESPIRATORY_TRACT
  Filled 2017-01-17: qty 3

## 2017-01-17 MED ORDER — PREDNISONE 20 MG PO TABS: 40.0000 mg | ORAL_TABLET | Freq: Every day | ORAL | 0 refills | Status: DC

## 2017-01-17 MED ORDER — ALBUTEROL (5 MG/ML) CONTINUOUS INHALATION SOLN
10.0000 mg/h | INHALATION_SOLUTION | RESPIRATORY_TRACT | Status: AC
  Administered 2017-01-17: 10 mg/h via RESPIRATORY_TRACT
  Filled 2017-01-17: qty 20

## 2017-01-17 MED ORDER — ALBUTEROL SULFATE (2.5 MG/3ML) 0.083% IN NEBU
2.5000 mg | INHALATION_SOLUTION | Freq: Once | RESPIRATORY_TRACT | Status: AC
  Administered 2017-01-17: 2.5 mg via RESPIRATORY_TRACT

## 2017-01-17 MED ORDER — KETOROLAC TROMETHAMINE 15 MG/ML IJ SOLN
15.0000 mg | Freq: Once | INTRAMUSCULAR | Status: AC
  Administered 2017-01-17: 15 mg via INTRAVENOUS
  Filled 2017-01-17: qty 1

## 2017-01-17 MED ORDER — METHYLPREDNISOLONE SODIUM SUCC 125 MG IJ SOLR
125.0000 mg | Freq: Once | INTRAMUSCULAR | Status: AC
  Administered 2017-01-17: 125 mg via INTRAVENOUS
  Filled 2017-01-17: qty 2

## 2017-01-17 MED ORDER — ALBUTEROL SULFATE (5 MG/ML) 0.5% IN NEBU: 2.5000 mg | INHALATION_SOLUTION | Freq: Four times a day (QID) | RESPIRATORY_TRACT | 12 refills | Status: DC | PRN

## 2017-01-17 MED ORDER — ALBUTEROL SULFATE HFA 108 (90 BASE) MCG/ACT IN AERS
2.0000 | INHALATION_SPRAY | Freq: Once | RESPIRATORY_TRACT | Status: AC
  Administered 2017-01-17: 2 via RESPIRATORY_TRACT
  Filled 2017-01-17: qty 6.7

## 2017-01-17 MED ORDER — IPRATROPIUM-ALBUTEROL 0.5-2.5 (3) MG/3ML IN SOLN
3.0000 mL | Freq: Once | RESPIRATORY_TRACT | Status: AC
  Administered 2017-01-17: 3 mL via RESPIRATORY_TRACT

## 2017-01-17 MED ORDER — GUAIFENESIN-CODEINE 100-10 MG/5ML PO SOLN
10.0000 mL | Freq: Once | ORAL | Status: AC
  Administered 2017-01-17: 10 mL via ORAL
  Filled 2017-01-17: qty 10

## 2017-01-17 NOTE — ED Triage Notes (Addendum)
Noticeably SOB, unable to speak in complete sentences. Pt [facility] and given abx. Used inhaler at home without relief.

## 2017-01-17 NOTE — ED Notes (Signed)
Alert, NAD, calm, interactive, resps e/u, speaking in clear complete sentences, no dyspnea noted, skin W&D, VSS, shaky after CAT neb, "feel the same, a little better", (denies: pain, nausea, dizziness or visual changes). [facility] and extra pillow. LS improved, expiratory wheezing remains.

## 2017-01-17 NOTE — ED Notes (Signed)
Pt ambulated around dept. SATS constant 93-98% HR 108-118 . No distress noted

## 2017-01-17 NOTE — ED Notes (Signed)
Resting comfortably, states, "feel better", breathing easier, calmer, less shaky, talking on phone.

## 2017-01-17 NOTE — ED Notes (Signed)
Pt given d/c instructions as per chart. Rx x 2. Verbalizes understanding. No questions. 

## 2017-02-02 NOTE — ED Provider Notes (Signed)
[facility] EMERGENCY DEPARTMENT Provider Note   CSN: [id number] Arrival date & time: 01/17/17  1832     History   Chief Complaint Chief Complaint  Patient presents with  . Shortness of Breath    HPI Tanya Espyrielle Herrera is a 31 y.o. female.  HPI   31 year old female with shortness of breath.  Worsening over the past 2-3 days.  She reports she was seen in urgent care yesterday, diagnosed with a URI and given antibiotics.  She is not sure what specifically.  She is presenting today because of continued shortness of breath/wheezing.  She has used her inhaler at home without much relief.  She denies any fevers or chills.  Chest tightness, but not pain per se.  No unusual leg pain or swelling.  Past Medical History:  Diagnosis Date  . Asthma   . H/O transfusion of packed red blood cells   . Hemophilia (HCC)   . HHT (hereditary hemorrhagic telangiectasia) (HCC)   . Hypertension     There are no active problems to display for this patient.   Past Surgical History:  Procedure Laterality Date  . ABDOMINAL HYSTERECTOMY    . CESAREAN SECTION     x 3  . DILATION AND CURETTAGE OF UTERUS    . ENDOMETRIAL ABLATION    . NOSE SURGERY    . TUBAL LIGATION      OB History    No data available       Home Medications    Prior to Admission medications   Medication Sig Start Date End Date Taking? Authorizing Provider  acetaminophen (TYLENOL) 500 MG tablet Take 500 mg by mouth every 6 (six) hours as needed.    [provider]  albuterol (PROVENTIL HFA;VENTOLIN HFA) 108 (90 BASE) MCG/ACT inhaler Inhale 2 puffs into the lungs every 6 (six) hours as needed for wheezing or shortness of breath.    [provider]  albuterol (PROVENTIL) (5 MG/ML) 0.5% nebulizer solution Take 0.5 mLs (2.5 mg total) by nebulization every 6 (six) hours as needed for wheezing or shortness of breath. 01/17/17   Raeford RazorKohut, Maliha Outten, MD  ALPRAZolam (XANAX PO) Take by mouth.    [provider]  Citalopram Hydrobromide (CELEXA PO) Take by mouth.    [provider]  diphenhydrAMINE (BENADRYL) 25 MG tablet Take 1 tablet (25 mg total) by mouth every 6 (six) hours as needed for itching. 07/25/16   Loren RacerYelverton, David, MD  HYDROcodone-acetaminophen (NORCO/VICODIN) 5-325 MG tablet Take 1-2 tablets by mouth every 6 (six) hours as needed for moderate pain or severe pain. 09/10/15   Muthersbaugh, Dahlia ClientHannah, PA-C  ibuprofen (ADVIL,MOTRIN) 600 MG tablet Take 1 tablet (600 mg total) by mouth every 6 (six) hours as needed. 07/25/16   Loren RacerYelverton, David, MD  LOSARTAN POTASSIUM PO Take by mouth.    [provider]  meclizine (ANTIVERT) 25 MG tablet Take 1 tablet (25 mg total) by mouth 3 (three) times daily as needed for dizziness. 09/18/16   Little, Ambrose Finlandachel Morgan, MD  Montelukast Sodium (SINGULAIR PO) Take by mouth.    [provider]  ondansetron (ZOFRAN ODT) 4 MG disintegrating tablet Take 1 tablet (4 mg total) by mouth every 8 (eight) hours as needed for nausea or vomiting. 09/18/16   Little, Ambrose Finlandachel Morgan, MD  ondansetron (ZOFRAN) 4 MG tablet Take 1 tablet (4 mg total) by mouth every 8 (eight) hours as needed for nausea or vomiting. 10/26/15   Reymundo PollGuilloud, Carolyn, MD  OVER THE COUNTER MEDICATION  OTC Migraine medications    [provider]  phentermine 37.5 MG capsule Take 37.5 mg by mouth every morning.    [provider]  predniSONE (DELTASONE) 20 MG tablet Take 2 tablets (40 mg total) by mouth daily. 01/17/17   Raeford RazorKohut, Jennefer Kopp, MD    Family History No family history on file.  Social History Social History   Tobacco Use  . Smoking status: Never Smoker  . Smokeless tobacco: Never Used  Substance Use Topics  . Alcohol use: Yes    Comment: occassionally  . Drug use: No     Allergies   Iron and Penicillins   Review of Systems Review of Systems  All systems reviewed and negative, other than as noted in HPI.   Physical Exam Updated Vital  Signs BP (!) 142/93   Pulse (!) 106   Temp 99 F (37.2 C) (Oral)   Resp 20   LMP 10/19/2015   SpO2 95%   Physical Exam  Constitutional: She appears well-developed and well-nourished. No distress.  HENT:  Head: Normocephalic and atraumatic.  Eyes: Conjunctivae are normal. Right eye exhibits no discharge. Left eye exhibits no discharge.  Neck: Neck supple.  Cardiovascular: Regular rhythm and normal heart sounds. Exam reveals no gallop and no friction rub.  No murmur heard. Tachycardic  Pulmonary/Chest:  Tachypnea.  Mild accessory muscle usage.  Diffuse wheezing bilaterally.  Prolonged expiratory phase.  Abdominal: Soft. She exhibits no distension. There is no tenderness.  Musculoskeletal: She exhibits no edema or tenderness.  Lower extremities symmetric as compared to each other. No calf tenderness. Negative Homan's. No palpable cords.   Neurological: She is alert.  Skin: Skin is warm and dry.  Psychiatric: She has a normal mood and affect. Her behavior is normal. Thought content normal.  Nursing note and vitals reviewed.    ED Treatments / Results  Labs (all labs ordered are listed, but only abnormal results are displayed) Labs Reviewed  CBC WITH DIFFERENTIAL/PLATELET - Abnormal; Notable for the following components:      Result Value   Eosinophils Absolute 0.8 (*)    All other components within normal limits  BASIC METABOLIC PANEL - Abnormal; Notable for the following components:   Sodium 134 (*)    Potassium 3.4 (*)    Glucose, Bld 107 (*)    All other components within normal limits    EKG  EKG Interpretation None       Radiology No results found.  Procedures Procedures (including critical care time)  Medications Ordered in ED Medications  albuterol (PROVENTIL,VENTOLIN) solution continuous neb (0 mg/hr Nebulization Stopped 01/17/17 2024)  ipratropium-albuterol (DUONEB) 0.5-2.5 (3) MG/3ML nebulizer solution 3 mL (3 mLs Nebulization Given 01/17/17 1845)    albuterol (PROVENTIL) (2.5 MG/3ML) 0.083% nebulizer solution 2.5 mg (2.5 mg Nebulization Given 01/17/17 1845)  ketorolac (TORADOL) 15 MG/ML injection 15 mg (15 mg Intravenous Given 01/17/17 2112)  guaiFENesin-codeine 100-10 MG/5ML solution 10 mL (10 mLs Oral Given 01/17/17 2112)  methylPREDNISolone sodium succinate (SOLU-MEDROL) 125 mg/2 mL injection 125 mg (125 mg Intravenous Given 01/17/17 2117)  albuterol (PROVENTIL HFA;VENTOLIN HFA) 108 (90 Base) MCG/ACT inhaler 2 puff (2 puffs Inhalation Given 01/17/17 2116)     Initial Impression / Assessment and Plan / ED Course  I have reviewed the triage vital signs and the nursing notes.  Pertinent labs & imaging results that were available during my care of the patient were reviewed by me and considered in my medical decision making (see chart for details).  31 year old female with asthma exacerbation.  She was treated with nebs and steroids.  She now feels significantly better.  Work of breathing has been much improved.  At this point, I feel she is appropriate for outpatient treatment.  She was given a prescription for refills for her nebulizer as well as good rates.  Return precautions discussed.  Outpatient follow-up otherwise.  Final Clinical Impressions(s) / ED Diagnoses   Final diagnoses:  Exacerbation of asthma, unspecified asthma severity, unspecified whether persistent    ED Discharge Orders        Ordered    albuterol (PROVENTIL) (5 MG/ML) 0.5% nebulizer solution  Every 6 hours PRN     01/17/17 2250    predniSONE (DELTASONE) 20 MG tablet  Daily     01/17/17 2250       Raeford Razor, MD 02/02/17 [phone]

## 2017-04-18 ENCOUNTER — Encounter (HOSPITAL_BASED_OUTPATIENT_CLINIC_OR_DEPARTMENT_OTHER): Payer: Self-pay | Admitting: Adult Health

## 2017-04-18 ENCOUNTER — Other Ambulatory Visit: Payer: Self-pay

## 2017-04-18 ENCOUNTER — Emergency Department (HOSPITAL_BASED_OUTPATIENT_CLINIC_OR_DEPARTMENT_OTHER)
Admission: EM | Admit: 2017-04-18 | Discharge: 2017-04-19 | Disposition: A | Discharge status: Home / Self Care | Payer: 59 | Attending: Emergency Medicine | Admitting: Emergency Medicine

## 2017-04-18 DIAGNOSIS — J45909 Unspecified asthma, uncomplicated: Secondary | ICD-10-CM | POA: Diagnosis present

## 2017-04-18 DIAGNOSIS — I10 Essential (primary) hypertension: Secondary | ICD-10-CM | POA: Diagnosis not present

## 2017-04-18 DIAGNOSIS — J45901 Unspecified asthma with (acute) exacerbation: Secondary | ICD-10-CM | POA: Diagnosis not present

## 2017-04-18 DIAGNOSIS — Z79899 Other long term (current) drug therapy: Secondary | ICD-10-CM | POA: Diagnosis not present

## 2017-04-18 MED ORDER — ALBUTEROL (5 MG/ML) CONTINUOUS INHALATION SOLN
15.0000 mg/h | INHALATION_SOLUTION | RESPIRATORY_TRACT | Status: AC
  Administered 2017-04-18: 15 mg/h via RESPIRATORY_TRACT
  Filled 2017-04-18: qty 20

## 2017-04-18 MED ORDER — ALBUTEROL SULFATE (2.5 MG/3ML) 0.083% IN NEBU
2.5000 mg | INHALATION_SOLUTION | Freq: Once | RESPIRATORY_TRACT | Status: AC
  Administered 2017-04-18: 2.5 mg via RESPIRATORY_TRACT
  Filled 2017-04-18: qty 3

## 2017-04-18 MED ORDER — ALBUTEROL SULFATE (2.5 MG/3ML) 0.083% IN NEBU: 2.5000 mg | INHALATION_SOLUTION | Freq: Once | RESPIRATORY_TRACT | Status: DC

## 2017-04-18 MED ORDER — PREDNISONE 50 MG PO TABS
50.0000 mg | ORAL_TABLET | Freq: Once | ORAL | Status: AC
  Administered 2017-04-18: 50 mg via ORAL
  Filled 2017-04-18: qty 1

## 2017-04-18 MED ORDER — IPRATROPIUM-ALBUTEROL 0.5-2.5 (3) MG/3ML IN SOLN
3.0000 mL | Freq: Once | RESPIRATORY_TRACT | Status: AC
  Administered 2017-04-18: 3 mL via RESPIRATORY_TRACT
  Filled 2017-04-18: qty 3

## 2017-04-18 NOTE — ED Provider Notes (Signed)
[facility] EMERGENCY DEPARTMENT Provider Note   CSN: [id number] Arrival date & time: 04/18/17  2159     History   Chief Complaint Chief Complaint  Patient presents with  . Asthma    HPI Tanya Herrera is a 32 y.o. female.  Complains of shortness of breath and wheezing typical of asthma onset 30 minutes ago after being exposed to insense.  She treated herself with prednisone 10 mg, without relief.  No other associated symptoms.  She felt well prior to asthma attack.  Nothing makes symptoms better or worse.  No other associated symptoms  HPI  Past Medical History:  Diagnosis Date  . Asthma   . H/O transfusion of packed red blood cells   . Hemophilia (HCC)   . HHT (hereditary hemorrhagic telangiectasia) (HCC)   . Hypertension     There are no active problems to display for this patient.   Past Surgical History:  Procedure Laterality Date  . ABDOMINAL HYSTERECTOMY    . CESAREAN SECTION     x 3  . DILATION AND CURETTAGE OF UTERUS    . ENDOMETRIAL ABLATION    . NOSE SURGERY    . TUBAL LIGATION      OB History    No data available       Home Medications    Prior to Admission medications   Medication Sig Start Date End Date Taking? Authorizing Provider  acetaminophen (TYLENOL) 500 MG tablet Take 500 mg by mouth every 6 (six) hours as needed.    [provider]  albuterol (PROVENTIL HFA;VENTOLIN HFA) 108 (90 BASE) MCG/ACT inhaler Inhale 2 puffs into the lungs every 6 (six) hours as needed for wheezing or shortness of breath.    [provider]  albuterol (PROVENTIL) (5 MG/ML) 0.5% nebulizer solution Take 0.5 mLs (2.5 mg total) by nebulization every 6 (six) hours as needed for wheezing or shortness of breath. 01/17/17   Raeford RazorKohut, Stephen, MD  ALPRAZolam (XANAX PO) Take by mouth.    [provider]  Citalopram Hydrobromide (CELEXA PO) Take by mouth.    [provider]  diphenhydrAMINE (BENADRYL) 25 MG tablet Take 1 tablet (25  mg total) by mouth every 6 (six) hours as needed for itching. 07/25/16   Loren RacerYelverton, David, MD  HYDROcodone-acetaminophen (NORCO/VICODIN) 5-325 MG tablet Take 1-2 tablets by mouth every 6 (six) hours as needed for moderate pain or severe pain. 09/10/15   Muthersbaugh, Dahlia ClientHannah, PA-C  ibuprofen (ADVIL,MOTRIN) 600 MG tablet Take 1 tablet (600 mg total) by mouth every 6 (six) hours as needed. 07/25/16   Loren RacerYelverton, David, MD  LOSARTAN POTASSIUM PO Take by mouth.    [provider]  meclizine (ANTIVERT) 25 MG tablet Take 1 tablet (25 mg total) by mouth 3 (three) times daily as needed for dizziness. 09/18/16   Little, Ambrose Finlandachel Morgan, MD  Montelukast Sodium (SINGULAIR PO) Take by mouth.    [provider]  ondansetron (ZOFRAN ODT) 4 MG disintegrating tablet Take 1 tablet (4 mg total) by mouth every 8 (eight) hours as needed for nausea or vomiting. 09/18/16   Little, Ambrose Finlandachel Morgan, MD  ondansetron (ZOFRAN) 4 MG tablet Take 1 tablet (4 mg total) by mouth every 8 (eight) hours as needed for nausea or vomiting. 10/26/15   Reymundo PollGuilloud, Carolyn, MD  OVER THE COUNTER MEDICATION OTC Migraine medications    [provider]  phentermine 37.5 MG capsule Take 37.5 mg by mouth every morning.    [provider]  predniSONE (DELTASONE)  20 MG tablet Take 2 tablets (40 mg total) by mouth daily. 01/17/17   Raeford Razor, MD    Family History History reviewed. No pertinent family history.  Social History Social History   Tobacco Use  . Smoking status: Never Smoker  . Smokeless tobacco: Never Used  Substance Use Topics  . Alcohol use: Yes    Comment: occassionally  . Drug use: No     Allergies   Iron and Penicillins   Review of Systems Review of Systems  Respiratory: Positive for shortness of breath and wheezing.   All other systems reviewed and are negative.    Physical Exam Updated Vital Signs BP (!) 135/92   Pulse (!) 107   Temp 97.9 F (36.6 C) (Oral)   Resp (!) 38   LMP  10/19/2015   SpO2 92%   Physical Exam  Constitutional: She appears well-developed and well-nourished.  Mild respiratory distress.  Speaks in sentences.  HENT:  Head: Normocephalic and atraumatic.  Eyes: Conjunctivae are normal. Pupils are equal, round, and reactive to light.  Neck: Neck supple. No tracheal deviation present. No thyromegaly present.  Cardiovascular: Regular rhythm.  No murmur heard. Mildly tachycardic  Pulmonary/Chest: She has wheezes.  Expiratory wheezes with prolonged expiratory phase and mild respiratory distress.  Abdominal: Soft. Bowel sounds are normal. She exhibits no distension. There is no tenderness.  Musculoskeletal: Normal range of motion. She exhibits no edema or tenderness.  Neurological: She is alert. Coordination normal.  Skin: Skin is warm and dry. No rash noted.  Psychiatric: She has a normal mood and affect.  Nursing note and vitals reviewed.    ED Treatments / Results  Labs (all labs ordered are listed, but only abnormal results are displayed) Labs Reviewed - No data to display  EKG  EKG Interpretation None       Radiology No results found.  Procedures Procedures (including critical care time)  Medications Ordered in ED Medications  predniSONE (DELTASONE) tablet 50 mg (not administered)  ipratropium-albuterol (DUONEB) 0.5-2.5 (3) MG/3ML nebulizer solution 3 mL (3 mLs Nebulization Given 04/18/17 2217)  albuterol (PROVENTIL) (2.5 MG/3ML) 0.083% nebulizer solution 2.5 mg (2.5 mg Nebulization Given 04/18/17 2217)     Initial Impression / Assessment and Plan / ED Course  I have reviewed the triage vital signs and the nursing notes.  Pertinent labs & imaging results that were available during my care of the patient were reviewed by me and considered in my medical decision making (see chart for details).     12:25 AM after treatment with prednisone, and albuterol nebulized treatment 2.5 mg and 1 hour of continuous nebulization, 15 mg  patient states her breathing is at baseline she is feels ready to go home she speaks in paragraphs lungs are clear to auscultation she was able to ambulate in the emergency department without dyspnea She has albuterol nebulizer and inhaler with spacer at home is requesting refills.  Also write prescription for prednisone.  Instructed to use albuterol every 4 hours as needed.  Return if needed more than every 4 hours or see her PMD Final Clinical Impressions(s) / ED Diagnoses  Diagnosis asthma exacerbation Final diagnoses:  None    ED Discharge Orders    None       Doug Sou, MD 04/19/17 0040

## 2017-04-18 NOTE — ED Notes (Addendum)
Pt assessed at registration window. Speaking phrases. Labored breathing. Hx of asthma with no relief after using inhaler. HR 115, O2 sats 97% on RA by portable pulse ox. Taken to room 6 for triage

## 2017-04-18 NOTE — ED Triage Notes (Addendum)
PResents asthma attack that began 2 hours ago, bilateral diminished breath sounds, speaking in short phrases, O2 sats 92% RA, tachycardia at 112. Denies recent illness. Non smoker, exposed to smoke labored respirations. Tachypneic at 38

## 2017-04-19 MED ORDER — ALBUTEROL SULFATE (2.5 MG/3ML) 0.083% IN NEBU: 2.5000 mg | INHALATION_SOLUTION | RESPIRATORY_TRACT | 12 refills | Status: DC | PRN

## 2017-04-19 MED ORDER — ALBUTEROL SULFATE HFA 108 (90 BASE) MCG/ACT IN AERS: 2.0000 | INHALATION_SPRAY | RESPIRATORY_TRACT | 0 refills | Status: DC | PRN

## 2017-04-19 MED ORDER — PREDNISONE 20 MG PO TABS: ORAL_TABLET | ORAL | 0 refills | Status: DC

## 2017-04-19 NOTE — ED Notes (Addendum)
EMT ambulated patient with pulse ox. O2 sat between 92-94 and pulse rate between 147-150.

## 2017-04-19 NOTE — Discharge Instructions (Addendum)
Use your albuterol inhaler with spacer or albuterol nebulizer every 4 hours as needed for shortness of breath.  If needed more than every 4 hours return or see your doctor.  Start taking the prednisone prescribed tomorrow at dinnertime

## 2017-07-30 ENCOUNTER — Emergency Department (HOSPITAL_BASED_OUTPATIENT_CLINIC_OR_DEPARTMENT_OTHER): Payer: 59

## 2017-07-30 ENCOUNTER — Encounter (HOSPITAL_BASED_OUTPATIENT_CLINIC_OR_DEPARTMENT_OTHER): Payer: Self-pay | Admitting: Adult Health

## 2017-07-30 ENCOUNTER — Other Ambulatory Visit: Payer: Self-pay

## 2017-07-30 ENCOUNTER — Emergency Department (HOSPITAL_BASED_OUTPATIENT_CLINIC_OR_DEPARTMENT_OTHER)
Admission: EM | Admit: 2017-07-30 | Discharge: 2017-07-30 | Disposition: A | Discharge status: Home / Self Care | Payer: 59 | Attending: Emergency Medicine | Admitting: Emergency Medicine

## 2017-07-30 DIAGNOSIS — I10 Essential (primary) hypertension: Secondary | ICD-10-CM | POA: Diagnosis not present

## 2017-07-30 DIAGNOSIS — Z79899 Other long term (current) drug therapy: Secondary | ICD-10-CM | POA: Insufficient documentation

## 2017-07-30 DIAGNOSIS — R1011 Right upper quadrant pain: Secondary | ICD-10-CM | POA: Insufficient documentation

## 2017-07-30 DIAGNOSIS — J45909 Unspecified asthma, uncomplicated: Secondary | ICD-10-CM | POA: Insufficient documentation

## 2017-07-30 LAB — COMPREHENSIVE METABOLIC PANEL
ALT: 22 U/L (ref 14–54)
ANION GAP: 9 (ref 5–15)
AST: 27 U/L (ref 15–41)
Albumin: 4.2 g/dL (ref 3.5–5.0)
Alkaline Phosphatase: 59 U/L (ref 38–126)
BUN: 9 mg/dL (ref 6–20)
CHLORIDE: 105 mmol/L (ref 101–111)
CO2: 22 mmol/L (ref 22–32)
CREATININE: 0.64 mg/dL (ref 0.44–1.00)
Calcium: 9.4 mg/dL (ref 8.9–10.3)
Glucose, Bld: 95 mg/dL (ref 65–99)
Potassium: 3.7 mmol/L (ref 3.5–5.1)
SODIUM: 136 mmol/L (ref 135–145)
Total Bilirubin: 0.4 mg/dL (ref 0.3–1.2)
Total Protein: 7.7 g/dL (ref 6.5–8.1)

## 2017-07-30 LAB — URINALYSIS, ROUTINE W REFLEX MICROSCOPIC
Bilirubin Urine: NEGATIVE
GLUCOSE, UA: NEGATIVE mg/dL
Hgb urine dipstick: NEGATIVE
Ketones, ur: NEGATIVE mg/dL
LEUKOCYTES UA: NEGATIVE
NITRITE: NEGATIVE
Protein, ur: NEGATIVE mg/dL
pH: 6 (ref 5.0–8.0)

## 2017-07-30 LAB — CBC
HCT: 42.9 % (ref 36.0–46.0)
HEMOGLOBIN: 15.7 g/dL — AB (ref 12.0–15.0)
MCH: 30.9 pg (ref 26.0–34.0)
MCHC: 36.6 g/dL — ABNORMAL HIGH (ref 30.0–36.0)
MCV: 84.4 fL (ref 78.0–100.0)
Platelets: 229 10*3/uL (ref 150–400)
RBC: 5.08 MIL/uL (ref 3.87–5.11)
RDW: 12.8 % (ref 11.5–15.5)
WBC: 8.6 10*3/uL (ref 4.0–10.5)

## 2017-07-30 LAB — LIPASE, BLOOD: LIPASE: 27 U/L (ref 11–51)

## 2017-07-30 LAB — PREGNANCY, URINE: Preg Test, Ur: NEGATIVE

## 2017-07-30 MED ORDER — ONDANSETRON 4 MG PO TBDP: 4.0000 mg | ORAL_TABLET | Freq: Three times a day (TID) | ORAL | 0 refills | Status: DC | PRN

## 2017-07-30 MED ORDER — MORPHINE SULFATE (PF) 4 MG/ML IV SOLN
4.0000 mg | Freq: Once | INTRAVENOUS | Status: AC
  Administered 2017-07-30: 4 mg via INTRAVENOUS
  Filled 2017-07-30: qty 1

## 2017-07-30 MED ORDER — KETOROLAC TROMETHAMINE 30 MG/ML IJ SOLN
30.0000 mg | Freq: Once | INTRAMUSCULAR | Status: AC
  Administered 2017-07-30: 30 mg via INTRAVENOUS
  Filled 2017-07-30: qty 1

## 2017-07-30 MED ORDER — HYDROCODONE-ACETAMINOPHEN 5-325 MG PO TABS: 1.0000 | ORAL_TABLET | Freq: Four times a day (QID) | ORAL | 0 refills | Status: DC | PRN

## 2017-07-30 MED ORDER — IOPAMIDOL (ISOVUE-300) INJECTION 61%
100.0000 mL | Freq: Once | INTRAVENOUS | Status: AC | PRN
  Administered 2017-07-30: 100 mL via INTRAVENOUS

## 2017-07-30 MED ORDER — ONDANSETRON HCL 4 MG/2ML IJ SOLN
4.0000 mg | Freq: Once | INTRAMUSCULAR | Status: AC
  Administered 2017-07-30: 4 mg via INTRAVENOUS
  Filled 2017-07-30: qty 2

## 2017-07-30 NOTE — ED Notes (Signed)
Patient transported to CT 

## 2017-07-30 NOTE — ED Triage Notes (Signed)
PResents with right lower abdominal pain that radiates into the back, the pain is constant and nothing makes the pain better. Movement makes the pain worse. IT has been ongoing for the past few months but became severe in the past 24 hours and is associated with anorexia. Denies vaginal discharge and bleeding, deneis hematuria, dysuria and frequency. Endorses intermittent nausea.

## 2017-07-30 NOTE — Discharge Instructions (Signed)
Please read and follow all provided instructions.  Your diagnoses today include:  1. Right upper quadrant abdominal pain    Tests performed today include:  Blood counts and electrolytes  Blood tests to check liver and kidney function  Blood tests to check pancreas function  Urine test to look for infection  Ultrasound of your abdomen  Vital signs. See below for your results today.   Medications prescribed:   Zofran (ondansetron) - for nausea and vomiting   Vicodin (hydrocodone/acetaminophen) - narcotic pain medication  DO NOT drive or perform any activities that require you to be awake and alert because this medicine can make you drowsy. BE VERY CAREFUL not to take multiple medicines containing Tylenol (also called acetaminophen). Doing so can lead to an overdose which can damage your liver and cause liver failure and possibly death.  Take any prescribed medications only as directed.  Home care instructions:   Follow any educational materials contained in this packet.  Follow-up instructions: Please follow-up with your primary care provider in the next 2 days for further evaluation of your symptoms.    Return instructions:  SEEK IMMEDIATE MEDICAL ATTENTION IF:  The pain does not go away or becomes severe   A temperature above 101F develops   Repeated vomiting occurs (multiple episodes)   The pain becomes localized to portions of the abdomen. The right side could possibly be appendicitis. In an adult, the left lower portion of the abdomen could be colitis or diverticulitis.   Blood is being passed in stools or vomit (bright red or black tarry stools)   You develop chest pain, difficulty breathing, dizziness or fainting, or become confused, poorly responsive, or inconsolable (young children)  If you have any other emergent concerns regarding your health  Additional Information: Abdominal (belly) pain can be caused by many things. Your caregiver performed an  examination and possibly ordered blood/urine tests and imaging (CT scan, x-rays, ultrasound). Many cases can be observed and treated at home after initial evaluation in the emergency department. Even though you are being discharged home, abdominal pain can be unpredictable. Therefore, you need a repeated exam if your pain does not resolve, returns, or worsens. Most patients with abdominal pain don't have to be admitted to the hospital or have surgery, but serious problems like appendicitis and gallbladder attacks can start out as nonspecific pain. Many abdominal conditions cannot be diagnosed in one visit, so follow-up evaluations are very important.  Your vital signs today were: BP (!) 130/99    Pulse 68    Temp 98.7 F (37.1 C) (Oral)    Resp 18    Ht  (1.651 m)    Wt 95.3 kg (210 lb)    LMP 10/19/2015    SpO2 100%    BMI 34.95 kg/m  If your blood pressure (bp) was elevated above 135/85 this visit, please have this repeated by your doctor within one month. --------------

## 2017-07-30 NOTE — ED Provider Notes (Signed)
[facility] EMERGENCY DEPARTMENT Provider Note   CSN: [id number] Arrival date & time: 07/30/17  1710     History   Chief Complaint Chief Complaint  Patient presents with  . Abdominal Pain    HPI Tanya Herrera is a 32 y.o. female.  Patient with history of hysterectomy, hemophilia --presents with intermittent right sided abdominal pain ongoing over the past month, more persistent over the past several days.  Patient has had nausea and decreased appetite but no vomiting.  Pain does not radiate.  She does not have any fevers, chest pain, shortness of breath.  No urinary symptoms, vaginal bleeding or discharge.  She has not had pain like this in the past, although she has had some ovarian cysts before.  Pain is made better with certain positions.  No treatments prior to arrival. The onset of this condition was acute. The course is constant. Aggravating factors: none. Alleviating factors: none.       Past Medical History:  Diagnosis Date  . Asthma   . H/O transfusion of packed red blood cells   . Hemophilia (HCC)   . HHT (hereditary hemorrhagic telangiectasia) (HCC)   . Hypertension     There are no active problems to display for this patient.   Past Surgical History:  Procedure Laterality Date  . ABDOMINAL HYSTERECTOMY    . CESAREAN SECTION     x 3  . DILATION AND CURETTAGE OF UTERUS    . ENDOMETRIAL ABLATION    . NOSE SURGERY    . TUBAL LIGATION       OB History   None      Home Medications    Prior to Admission medications   Medication Sig Start Date End Date Taking? Authorizing Provider  acetaminophen (TYLENOL) 500 MG tablet Take 500 mg by mouth every 6 (six) hours as needed.    [provider]  albuterol (PROVENTIL HFA;VENTOLIN HFA) 108 (90 Base) MCG/ACT inhaler Inhale 2 puffs into the lungs every 4 (four) hours as needed for wheezing or shortness of breath. 04/19/17   Doug Sou, MD  albuterol (PROVENTIL) (2.5 MG/3ML) 0.083% nebulizer  solution Take 3 mLs (2.5 mg total) by nebulization every 4 (four) hours as needed for wheezing or shortness of breath. 04/19/17   Doug Sou, MD  ALPRAZolam (XANAX PO) Take by mouth.    [provider]  Citalopram Hydrobromide (CELEXA PO) Take by mouth.    [provider]  diphenhydrAMINE (BENADRYL) 25 MG tablet Take 1 tablet (25 mg total) by mouth every 6 (six) hours as needed for itching. 07/25/16   Loren Racer, MD  HYDROcodone-acetaminophen (NORCO/VICODIN) 5-325 MG tablet Take 1-2 tablets by mouth every 6 (six) hours as needed for moderate pain or severe pain. 09/10/15   Muthersbaugh, Dahlia Client, PA-C  ibuprofen (ADVIL,MOTRIN) 600 MG tablet Take 1 tablet (600 mg total) by mouth every 6 (six) hours as needed. 07/25/16   Loren Racer, MD  LOSARTAN POTASSIUM PO Take by mouth.    [provider]  meclizine (ANTIVERT) 25 MG tablet Take 1 tablet (25 mg total) by mouth 3 (three) times daily as needed for dizziness. 09/18/16   Little, Ambrose Finland, MD  Montelukast Sodium (SINGULAIR PO) Take by mouth.    [provider]  ondansetron (ZOFRAN ODT) 4 MG disintegrating tablet Take 1 tablet (4 mg total) by mouth every 8 (eight) hours as needed for nausea or vomiting. 09/18/16   Little, Ambrose Finland, MD  ondansetron (ZOFRAN) 4 MG tablet Take  1 tablet (4 mg total) by mouth every 8 (eight) hours as needed for nausea or vomiting. 10/26/15   Reymundo Poll, MD  OVER THE COUNTER MEDICATION OTC Migraine medications    [provider]  phentermine 37.5 MG capsule Take 37.5 mg by mouth every morning.    [provider]  predniSONE (DELTASONE) 20 MG tablet 2 tabs po daily x 4 days 04/19/17   Doug Sou, MD    Family History History reviewed. No pertinent family history.  Social History Social History   Tobacco Use  . Smoking status: Never Smoker  . Smokeless tobacco: Never Used  Substance Use Topics  . Alcohol use: Yes    Comment: occassionally  .  Drug use: No     Allergies   Iron and Penicillins   Review of Systems Review of Systems  Constitutional: Positive for appetite change. Negative for fever.  HENT: Negative for rhinorrhea and sore throat.   Eyes: Negative for redness.  Respiratory: Negative for cough.   Cardiovascular: Negative for chest pain.  Gastrointestinal: Positive for abdominal pain and nausea. Negative for diarrhea and vomiting.  Genitourinary: Negative for dysuria.  Musculoskeletal: Negative for myalgias.  Skin: Negative for rash.  Neurological: Negative for headaches.     Physical Exam Updated Vital Signs BP (!) 130/99   Pulse 68   Temp 98.7 F (37.1 C) (Oral)   Resp 18   Ht  (1.651 m)   Wt 95.3 kg (210 lb)   LMP 10/19/2015   SpO2 100%   BMI 34.95 kg/m   Physical Exam  Constitutional: She appears well-developed and well-nourished.  HENT:  Head: Normocephalic and atraumatic.  Eyes: Conjunctivae are normal. Right eye exhibits no discharge. Left eye exhibits no discharge.  Neck: Normal range of motion. Neck supple.  Cardiovascular: Normal rate, regular rhythm and normal heart sounds.  Pulmonary/Chest: Effort normal and breath sounds normal. No respiratory distress. She has no wheezes. She has no rales.  Abdominal: Soft. There is tenderness in the right upper quadrant and right lower quadrant. There is no rebound, no guarding and negative Murphy's sign.  Patient with right-sided abdominal pain seems to be more intense in the right upper quadrant and right lateral abdomen.  Pain is mild to moderate.  Neurological: She is alert.  Skin: Skin is warm and dry.  Psychiatric: She has a normal mood and affect.  Nursing note and vitals reviewed.    ED Treatments / Results  Labs (all labs ordered are listed, but only abnormal results are displayed) Labs Reviewed  CBC - Abnormal; Notable for the following components:      Result Value   Hemoglobin 15.7 (*)    MCHC 36.6 (*)    All other  components within normal limits  URINALYSIS, ROUTINE W REFLEX MICROSCOPIC - Abnormal; Notable for the following components:   Specific Gravity, Urine >1.030 (*)    All other components within normal limits  LIPASE, BLOOD  COMPREHENSIVE METABOLIC PANEL  PREGNANCY, URINE    EKG None  Radiology No results found.  Procedures Procedures (including critical care time)  Medications Ordered in ED Medications - No data to display   Initial Impression / Assessment and Plan / ED Course  I have reviewed the triage vital signs and the nursing notes.  Pertinent labs & imaging results that were available during my care of the patient were reviewed by me and considered in my medical decision making (see chart for details).     Patient seen  and examined. Work-up initiated. Medications ordered.   Vital signs reviewed and are as follows: BP (!) 130/99   Pulse 68   Temp 98.7 F (37.1 C) (Oral)   Resp 18   Ht  (1.651 m)   Wt 95.3 kg (210 lb)   LMP 10/19/2015   SpO2 100%   BMI 34.95 kg/m   Lab work-up is reassuring.  No transaminitis, elevated lipase, elevated white cell count.  Will eval symptoms with ultrasound.  8:05 PM Pending results of [place].  Patient reevaluated.  Abdominal exam remained stable.  Discussed case with Lawyer PA-C who will follow up on results.  Anticipate discharge to home with pain control and nausea medicine with close PCP follow-up.  Final Clinical Impressions(s) / ED Diagnoses   Final diagnoses:  Right upper quadrant abdominal pain   Patient with right-sided abdominal pain.  Course has been waxing and waning for the past month with recent worsening.  Patient has had nausea but no vomiting.  No fevers.  Vitals are stable, no fever. Labs show some dehydration with elevated hgb and elevated spec grav. Otherwise normal. Imaging pending. No signs of dehydration, patient is tolerating PO's. Lungs are clear and no signs suggestive of PNA. Low concern for  appendicitis, cholecystitis, pancreatitis, ruptured viscus, UTI, kidney stone, aortic dissection, aortic aneurysm or other emergent abdominal etiology. Pt has no urinary or vaginal complaints. Close PCP f/u recommended.   ED Discharge Orders        Ordered    HYDROcodone-acetaminophen (NORCO/VICODIN) 5-325 MG tablet  Every 6 hours PRN     07/30/17 1959    ondansetron (ZOFRAN ODT) 4 MG disintegrating tablet  Every 8 hours PRN     07/30/17 1959       Renne Crigler, PA-C 07/30/17 2007    Loren Racer, MD 07/31/17 [phone]

## 2017-10-07 ENCOUNTER — Other Ambulatory Visit: Payer: Self-pay

## 2017-10-07 ENCOUNTER — Encounter (HOSPITAL_BASED_OUTPATIENT_CLINIC_OR_DEPARTMENT_OTHER): Payer: Self-pay

## 2017-10-07 ENCOUNTER — Emergency Department (HOSPITAL_BASED_OUTPATIENT_CLINIC_OR_DEPARTMENT_OTHER)
Admission: EM | Admit: 2017-10-07 | Discharge: 2017-10-07 | Disposition: A | Discharge status: Home / Self Care | Payer: 59 | Attending: Emergency Medicine | Admitting: Emergency Medicine

## 2017-10-07 ENCOUNTER — Emergency Department (HOSPITAL_BASED_OUTPATIENT_CLINIC_OR_DEPARTMENT_OTHER): Payer: 59

## 2017-10-07 DIAGNOSIS — I10 Essential (primary) hypertension: Secondary | ICD-10-CM | POA: Diagnosis not present

## 2017-10-07 DIAGNOSIS — R519 Headache, unspecified: Secondary | ICD-10-CM

## 2017-10-07 DIAGNOSIS — Z79899 Other long term (current) drug therapy: Secondary | ICD-10-CM | POA: Diagnosis not present

## 2017-10-07 DIAGNOSIS — J45909 Unspecified asthma, uncomplicated: Secondary | ICD-10-CM | POA: Diagnosis not present

## 2017-10-07 DIAGNOSIS — M546 Pain in thoracic spine: Secondary | ICD-10-CM | POA: Diagnosis not present

## 2017-10-07 DIAGNOSIS — R51 Headache: Secondary | ICD-10-CM | POA: Diagnosis not present

## 2017-10-07 DIAGNOSIS — M62830 Muscle spasm of back: Secondary | ICD-10-CM

## 2017-10-07 HISTORY — DX: Major depressive disorder, single episode, unspecified: F32.9

## 2017-10-07 HISTORY — DX: Panic disorder (episodic paroxysmal anxiety): F41.0

## 2017-10-07 HISTORY — DX: Post-traumatic stress disorder, unspecified: F43.10

## 2017-10-07 HISTORY — DX: Insomnia, unspecified: G47.00

## 2017-10-07 MED ORDER — SODIUM CHLORIDE 0.9 % IV BOLUS
500.0000 mL | Freq: Once | INTRAVENOUS | Status: AC
  Administered 2017-10-07: 500 mL via INTRAVENOUS

## 2017-10-07 MED ORDER — DEXAMETHASONE SODIUM PHOSPHATE 10 MG/ML IJ SOLN
10.0000 mg | Freq: Once | INTRAMUSCULAR | Status: AC
  Administered 2017-10-07: 10 mg via INTRAVENOUS

## 2017-10-07 MED ORDER — LEVOCETIRIZINE DIHYDROCHLORIDE 5 MG PO TABS: 5.0000 mg | ORAL_TABLET | Freq: Every evening | ORAL | 0 refills | Status: DC

## 2017-10-07 MED ORDER — DEXAMETHASONE SODIUM PHOSPHATE 10 MG/ML IJ SOLN
10.0000 mg | Freq: Once | INTRAMUSCULAR | Status: DC
  Filled 2017-10-07: qty 1

## 2017-10-07 MED ORDER — CYCLOBENZAPRINE HCL 5 MG PO TABS: 5.0000 mg | ORAL_TABLET | Freq: Two times a day (BID) | ORAL | 0 refills | Status: DC | PRN

## 2017-10-07 MED ORDER — KETOROLAC TROMETHAMINE 30 MG/ML IJ SOLN
15.0000 mg | Freq: Once | INTRAMUSCULAR | Status: AC
  Administered 2017-10-07: 15 mg via INTRAVENOUS
  Filled 2017-10-07: qty 1

## 2017-10-07 MED ORDER — DIPHENHYDRAMINE HCL 50 MG/ML IJ SOLN
12.5000 mg | Freq: Once | INTRAMUSCULAR | Status: AC
  Administered 2017-10-07: 12.5 mg via INTRAVENOUS
  Filled 2017-10-07: qty 1

## 2017-10-07 NOTE — Discharge Instructions (Signed)
Your xray was negative for any acute abnormality. You have an upper back muscle spasm. Your klonopin is an effective muscle relaxer, but you can use the flexeril. Do not combine these medications.  You are having a headache. No specific cause was found today for your headache. It may have been a migraine or other cause of headache. Stress, anxiety, fatigue, and depression are common triggers for headaches. Your headache today does not appear to be life-threatening or require hospitalization, but often the exact cause of headaches is not determined in the emergency department. Therefore, follow-up with your doctor is very important to find out what may have caused your headache, and whether or not you need any further diagnostic testing or treatment. Sometimes headaches can appear benign (not harmful), but then more serious symptoms can develop which should prompt an immediate re-evaluation by your doctor or the emergency department. SEEK MEDICAL ATTENTION IF: You develop possible problems with medications prescribed.  The medications don't resolve your headache, if it recurs , or if you have multiple episodes of vomiting or can't take fluids. You have a change from the usual headache. RETURN IMMEDIATELY IF you develop a sudden, severe headache or confusion, become poorly responsive or faint, develop a fever above 100.13F or problem breathing, have a change in speech, vision, swallowing, or understanding, or develop new weakness, numbness, tingling, incoordination, or have a seizure.

## 2017-10-07 NOTE — ED Notes (Signed)
Pt teaching provided on medications that may cause drowsiness. Pt instructed not to drive or operate heavy machinery while taking the prescribed medication. Pt verbalized understanding.   

## 2017-10-07 NOTE — ED Notes (Signed)
ED Provider at bedside. 

## 2017-10-07 NOTE — ED Triage Notes (Addendum)
Pt c/o "sinus HA" with congestion x 3 days-also c/o pain to mid back-pain worse with breathing-NAD-steady gait

## 2017-10-07 NOTE — ED Provider Notes (Signed)
[facility] EMERGENCY DEPARTMENT Provider Note   CSN: [id number] Arrival date & time: 10/07/17  1159     History   Chief Complaint Chief Complaint  Patient presents with  . Headache    HPI Tanya Herrera is a 32 y.o. female.  Who presents the emergency department with chief complaint of headache and back pain.  Patient states that she has had 2 days of cough, nasal congestion and sinus pressure headache behind her eyes.  She has not taken anything for the headache.  She normally takes Benadryl for allergies but has not taken that either because it makes her sleepy and she has had to work.  Yesterday while she was at work she noticed a little bit of a nagging pain behind her right shoulder blade.  She states that it became worse.  Hurts worse when she sits upright or moves her shoulder blade also when she takes deep breaths.  She denies hemoptysis.  She has had a hysterectomy is not on oral contraceptives.  She does not take any exogenous estrogens.  She is a non-smoker.  She denies unilateral leg swelling, shortness of breath.  HPI  Past Medical History:  Diagnosis Date  . Asthma   . H/O transfusion of packed red blood cells   . Hemophilia (HCC)   . HHT (hereditary hemorrhagic telangiectasia) (HCC)   . Hypertension   . Insomnia   . Major depressive disorder   . Panic disorder   . PTSD (post-traumatic stress disorder)     There are no active problems to display for this patient.   Past Surgical History:  Procedure Laterality Date  . ABDOMINAL HYSTERECTOMY    . CESAREAN SECTION     x 3  . DILATION AND CURETTAGE OF UTERUS    . ENDOMETRIAL ABLATION    . NOSE SURGERY    . TUBAL LIGATION       OB History   None      Home Medications    Prior to Admission medications   Medication Sig Start Date End Date Taking? Authorizing Provider  acetaminophen (TYLENOL) 500 MG tablet Take 500 mg by mouth every 6 (six) hours as needed.    [provider]    albuterol (PROVENTIL HFA;VENTOLIN HFA) 108 (90 Base) MCG/ACT inhaler Inhale 2 puffs into the lungs every 4 (four) hours as needed for wheezing or shortness of breath. 04/19/17   Doug SouJacubowitz, Sam, MD  albuterol (PROVENTIL) (2.5 MG/3ML) 0.083% nebulizer solution Take 3 mLs (2.5 mg total) by nebulization every 4 (four) hours as needed for wheezing or shortness of breath. 04/19/17   Doug SouJacubowitz, Sam, MD  ALPRAZolam (XANAX PO) Take by mouth.    [provider]  Citalopram Hydrobromide (CELEXA PO) Take by mouth.    [provider]  diphenhydrAMINE (BENADRYL) 25 MG tablet Take 1 tablet (25 mg total) by mouth every 6 (six) hours as needed for itching. 07/25/16   Loren RacerYelverton, David, MD  HYDROcodone-acetaminophen (NORCO/VICODIN) 5-325 MG tablet Take 1-2 tablets by mouth every 6 (six) hours as needed. 07/30/17   Renne CriglerGeiple, Joshua, PA-C  ibuprofen (ADVIL,MOTRIN) 600 MG tablet Take 1 tablet (600 mg total) by mouth every 6 (six) hours as needed. 07/25/16   Loren RacerYelverton, David, MD  LOSARTAN POTASSIUM PO Take by mouth.    [provider]  meclizine (ANTIVERT) 25 MG tablet Take 1 tablet (25 mg total) by mouth 3 (three) times daily as needed for dizziness. 09/18/16   Little, Ambrose Finlandachel Morgan, MD  Montelukast Sodium (  SINGULAIR PO) Take by mouth.    [provider]  ondansetron (ZOFRAN ODT) 4 MG disintegrating tablet Take 1 tablet (4 mg total) by mouth every 8 (eight) hours as needed for nausea or vomiting. 07/30/17   Renne Crigler, PA-C  ondansetron (ZOFRAN) 4 MG tablet Take 1 tablet (4 mg total) by mouth every 8 (eight) hours as needed for nausea or vomiting. 10/26/15   Reymundo Poll, MD  OVER THE COUNTER MEDICATION OTC Migraine medications    [provider]  phentermine 37.5 MG capsule Take 37.5 mg by mouth every morning.    [provider]  predniSONE (DELTASONE) 20 MG tablet 2 tabs po daily x 4 days 04/19/17   Doug Sou, MD    Family History No family history on  file.  Social History Social History   Tobacco Use  . Smoking status: Never Smoker  . Smokeless tobacco: Never Used  Substance Use Topics  . Alcohol use: Yes    Comment: occassionally  . Drug use: No     Allergies   Iron and Penicillins   Review of Systems Review of Systems  Ten systems reviewed and are negative for acute change, except as noted in the HPI.   Physical Exam Updated Vital Signs BP 104/79 (BP Location: Left Arm)   Pulse 65   Temp 98.3 F (36.8 C) (Oral)   Resp 18   Ht 5\' 5"  (1.651 m)   Wt 96.6 kg (213 lb)   LMP 10/19/2015   SpO2 99%   BMI 35.45 kg/m   Physical Exam  Constitutional: She is oriented to person, place, and time. She appears well-developed and well-nourished. No distress.  HENT:  Head: Normocephalic and atraumatic.  Mouth/Throat: Oropharynx is clear and moist.  Eyes: Pupils are equal, round, and reactive to light. Conjunctivae and EOM are normal. No scleral icterus.  No horizontal, vertical or rotational nystagmus  Neck: Normal range of motion. Neck supple.  Full active and passive ROM without pain No midline or paraspinal tenderness No nuchal rigidity or meningeal signs  Cardiovascular: Normal rate, regular rhythm, normal heart sounds and intact distal pulses. Exam reveals no gallop and no friction rub.  No murmur heard. Pulmonary/Chest: Effort normal and breath sounds normal. No respiratory distress. She has no wheezes. She has no rales.  Abdominal: Soft. Bowel sounds are normal. She exhibits no distension and no mass. There is no tenderness. There is no rebound and no guarding.  Musculoskeletal: Normal range of motion.       Thoracic back: She exhibits tenderness.       Back:  Lymphadenopathy:    She has no cervical adenopathy.  Neurological: She is alert and oriented to person, place, and time. She has normal reflexes. No cranial nerve deficit. She exhibits normal muscle tone. Coordination normal.  Mental Status:  Alert,  oriented, thought content appropriate. Speech fluent without evidence of aphasia. Able to follow 2 step commands without difficulty.  Cranial Nerves:  II:  Peripheral visual fields grossly normal, pupils equal, round, reactive to light III,IV, VI: ptosis not present, extra-ocular motions intact bilaterally  V,VII: smile symmetric, facial light touch sensation equal VIII: hearing grossly normal bilaterally  IX,X: midline uvula rise  XI: bilateral shoulder shrug equal and strong XII: midline tongue extension  Motor:  5/5 in upper and lower extremities bilaterally including strong and equal grip strength and dorsiflexion/plantar flexion Sensory: Pinprick and light touch normal in all extremities.  Deep Tendon Reflexes: 2+ and symmetric  Cerebellar: normal  finger-to-nose with bilateral upper extremities Gait: normal gait and balance CV: distal pulses palpable throughout   Skin: Skin is warm and dry. No rash noted. She is not diaphoretic.  Psychiatric: She has a normal mood and affect. Her behavior is normal. Judgment and thought content normal.  Nursing note and vitals reviewed.    ED Treatments / Results  Labs (all labs ordered are listed, but only abnormal results are displayed) Labs Reviewed - No data to display  EKG None  Radiology No results found.  Procedures Procedures (including critical care time)  Medications Ordered in ED Medications - No data to display   Initial Impression / Assessment and Plan / ED Course  I have reviewed the triage vital signs and the nursing notes.  Pertinent labs & imaging results that were available during my care of the patient were reviewed by me and considered in my medical decision making (see chart for details).     Patient with reproducible pain in the posterior thoracic region.  Appears to be spastic tissue.  Patient has large heavy breasts in the front and anterior rounding which would predispose her to spasm in the opposing muscle  group.  I have no concern for pulmonary embolus she meets PERC criteria and is not hypoxic, short of breath or tachycardic.  Patient's headache is resolved and she has some relief of her sinus congestion after Decadron Toradol.  Patient will be discharged to follow-up with her PCP.  I have no concern for subarachnoid hemorrhage, meningitis, or other emergent cause of her headache.  Final Clinical Impressions(s) / ED Diagnoses   Final diagnoses:  Sinus headache  Trigger point of thoracic region  Back muscle spasm    ED Discharge Orders    None       Arthor Captain, PA-C 10/07/17 Judithann Graves, MD 10/08/17 1025

## 2017-12-02 ENCOUNTER — Emergency Department (HOSPITAL_BASED_OUTPATIENT_CLINIC_OR_DEPARTMENT_OTHER)
Admission: EM | Admit: 2017-12-02 | Discharge: 2017-12-02 | Disposition: A | Discharge status: Home / Self Care | Payer: 59 | Attending: Emergency Medicine | Admitting: Emergency Medicine

## 2017-12-02 ENCOUNTER — Other Ambulatory Visit: Payer: Self-pay

## 2017-12-02 ENCOUNTER — Encounter (HOSPITAL_BASED_OUTPATIENT_CLINIC_OR_DEPARTMENT_OTHER): Payer: Self-pay

## 2017-12-02 DIAGNOSIS — I10 Essential (primary) hypertension: Secondary | ICD-10-CM | POA: Diagnosis not present

## 2017-12-02 DIAGNOSIS — M542 Cervicalgia: Secondary | ICD-10-CM | POA: Insufficient documentation

## 2017-12-02 DIAGNOSIS — M545 Low back pain, unspecified: Secondary | ICD-10-CM

## 2017-12-02 DIAGNOSIS — M549 Dorsalgia, unspecified: Secondary | ICD-10-CM | POA: Diagnosis present

## 2017-12-02 DIAGNOSIS — F329 Major depressive disorder, single episode, unspecified: Secondary | ICD-10-CM | POA: Diagnosis not present

## 2017-12-02 DIAGNOSIS — J45909 Unspecified asthma, uncomplicated: Secondary | ICD-10-CM | POA: Insufficient documentation

## 2017-12-02 DIAGNOSIS — Z79899 Other long term (current) drug therapy: Secondary | ICD-10-CM | POA: Insufficient documentation

## 2017-12-02 MED ORDER — CYCLOBENZAPRINE HCL 5 MG PO TABS
5.0000 mg | ORAL_TABLET | Freq: Once | ORAL | Status: AC
  Administered 2017-12-02: 5 mg via ORAL
  Filled 2017-12-02: qty 1

## 2017-12-02 MED ORDER — KETOROLAC TROMETHAMINE 60 MG/2ML IM SOLN
30.0000 mg | Freq: Once | INTRAMUSCULAR | Status: AC
  Administered 2017-12-02: 30 mg via INTRAMUSCULAR
  Filled 2017-12-02: qty 2

## 2017-12-02 MED ORDER — CYCLOBENZAPRINE HCL 5 MG PO TABS: 5.0000 mg | ORAL_TABLET | Freq: Two times a day (BID) | ORAL | 0 refills | Status: AC | PRN

## 2017-12-02 MED ORDER — IBUPROFEN 800 MG PO TABS: 800.0000 mg | ORAL_TABLET | Freq: Three times a day (TID) | ORAL | 0 refills | Status: AC | PRN

## 2017-12-02 NOTE — ED Provider Notes (Signed)
[facility] EMERGENCY DEPARTMENT Provider Note  CSN: [id number] Arrival date & time: 12/02/17  2218    History   Chief Complaint Chief Complaint  Patient presents with  . Back Pain    HPI Tanya Herrera is a 32 y.o. female with a medical history of hemophilia, asthma and HTN who presented to the ED for bilateral back pain x1 week. Patient describes dull, aching  in her mid and lower back. Pain does not radiate to lower extremities or abdomen. Patient reports pain began after a twin size box spring landed (flat side) on her back while she was rearranging her daughter's room. The box spring did not break and patient did not go to the ground because of the impact. Pain is worse with movement and moderately relieved with ibuprofen. Denies fever, other arthralgias, skin rashes/lesions, bowel or bladder incontinence, saddle anesthesia, paresthesias, weakness or gait difficulties.   Past Medical History:  Diagnosis Date  . Asthma   . H/O transfusion of packed red blood cells   . Hemophilia (HCC)   . HHT (hereditary hemorrhagic telangiectasia) (HCC)   . Hypertension   . Insomnia   . Major depressive disorder   . Panic disorder   . PTSD (post-traumatic stress disorder)     There are no active problems to display for this patient.   Past Surgical History:  Procedure Laterality Date  . ABDOMINAL HYSTERECTOMY    . ABDOMINAL SURGERY    . CESAREAN SECTION     x 3  . DILATION AND CURETTAGE OF UTERUS    . ENDOMETRIAL ABLATION    . NOSE SURGERY    . TUBAL LIGATION       OB History   None      Home Medications    Prior to Admission medications   Medication Sig Start Date End Date Taking? Authorizing Provider  acetaminophen (TYLENOL) 500 MG tablet Take 500 mg by mouth every 6 (six) hours as needed.    [provider]  albuterol (PROVENTIL HFA;VENTOLIN HFA) 108 (90 Base) MCG/ACT inhaler Inhale 2 puffs into the lungs every 4 (four) hours as needed for wheezing or  shortness of breath. 04/19/17   Doug Sou, MD  albuterol (PROVENTIL) (2.5 MG/3ML) 0.083% nebulizer solution Take 3 mLs (2.5 mg total) by nebulization every 4 (four) hours as needed for wheezing or shortness of breath. 04/19/17   Doug Sou, MD  ALPRAZolam (XANAX PO) Take by mouth.    [provider]  Citalopram Hydrobromide (CELEXA PO) Take by mouth.    [provider]  cyclobenzaprine (FLEXERIL) 5 MG tablet Take 1 tablet (5 mg total) by mouth 2 (two) times daily as needed for up to 10 days for muscle spasms. 12/02/17 12/12/17  Mortis, Jerrel Ivory I, PA-C  diphenhydrAMINE (BENADRYL) 25 MG tablet Take 1 tablet (25 mg total) by mouth every 6 (six) hours as needed for itching. 07/25/16   Loren Racer, MD  HYDROcodone-acetaminophen (NORCO/VICODIN) 5-325 MG tablet Take 1-2 tablets by mouth every 6 (six) hours as needed. 07/30/17   Renne Crigler, PA-C  ibuprofen (ADVIL,MOTRIN) 800 MG tablet Take 1 tablet (800 mg total) by mouth every 8 (eight) hours as needed for up to 10 days. 12/02/17 12/12/17  Mortis, Jerrel Ivory I, PA-C  levocetirizine (XYZAL) 5 MG tablet Take 1 tablet (5 mg total) by mouth every evening. 10/07/17   Harris, Cammy Copa, PA-C  LOSARTAN POTASSIUM PO Take by mouth.    [provider]  meclizine (ANTIVERT) 25 MG tablet Take 1  tablet (25 mg total) by mouth 3 (three) times daily as needed for dizziness. 09/18/16   Little, Ambrose Finland, MD  Montelukast Sodium (SINGULAIR PO) Take by mouth.    [provider]  ondansetron (ZOFRAN ODT) 4 MG disintegrating tablet Take 1 tablet (4 mg total) by mouth every 8 (eight) hours as needed for nausea or vomiting. 07/30/17   Renne Crigler, PA-C  ondansetron (ZOFRAN) 4 MG tablet Take 1 tablet (4 mg total) by mouth every 8 (eight) hours as needed for nausea or vomiting. 10/26/15   Reymundo Poll, MD  OVER THE COUNTER MEDICATION OTC Migraine medications    [provider]  phentermine 37.5 MG capsule Take 37.5 mg by  mouth every morning.    [provider]  predniSONE (DELTASONE) 20 MG tablet 2 tabs po daily x 4 days 04/19/17   Doug Sou, MD    Family History No family history on file.  Social History Social History   Tobacco Use  . Smoking status: Never Smoker  . Smokeless tobacco: Never Used  Substance Use Topics  . Alcohol use: Yes    Comment: occassionally  . Drug use: No     Allergies   Iron and Penicillins   Review of Systems Review of Systems  Constitutional: Negative.   Gastrointestinal: Negative.   Genitourinary: Negative.   Musculoskeletal: Positive for back pain and neck pain. Negative for arthralgias, gait problem, joint swelling and neck stiffness.  Skin: Negative for color change and wound.  Neurological: Negative for weakness and numbness.  Hematological: Bruises/bleeds easily.   Physical Exam Updated Vital Signs BP 119/88 (BP Location: Left Arm)   Pulse 75   Temp 98.3 F (36.8 C) (Oral)   Resp 20   Ht 5\' 5"  (1.651 m)   Wt 93.4 kg   LMP 10/19/2015   SpO2 99%   BMI 34.28 kg/m   Physical Exam  Constitutional: Vital signs are normal. She appears well-developed and well-nourished. She is cooperative.  Neck: Normal range of motion and full passive range of motion without pain. Neck supple. Muscular tenderness present. No spinous process tenderness present. Normal range of motion present.  Cardiovascular: Intact distal pulses and normal pulses.  Musculoskeletal:  Full ROM in upper and lower extremities bilaterally with 5/5 strength. Paraspinal muscular tenderness of thoracic and lumbar spine (right > left). No midline tenderness or deformities. Bilateral trapezius muscles tender to palpation.  Neurological: She is alert. She has normal strength. No sensory deficit. She exhibits normal muscle tone.  Reflex Scores:      Tricep reflexes are 2+ on the right side and 2+ on the left side.      Bicep reflexes are 2+ on the right side and 2+ on the left  side.      Brachioradialis reflexes are 2+ on the right side and 2+ on the left side.      Patellar reflexes are 2+ on the right side and 2+ on the left side.      Achilles reflexes are 2+ on the right side and 2+ on the left side. Skin: Skin is warm. Capillary refill takes less than 2 seconds. No abrasion and no bruising noted.  No signs of injury on the back from where impact occurred.  Nursing note and vitals reviewed.   ED Treatments / Results  Labs (all labs ordered are listed, but only abnormal results are displayed) Labs Reviewed - No data to display  EKG None  Radiology No results found.  Procedures Procedures (  including critical care time)  Medications Ordered in ED Medications  ketorolac (TORADOL) injection 30 mg (30 mg Intramuscular Given 12/02/17 2308)  cyclobenzaprine (FLEXERIL) tablet 5 mg (5 mg Oral Given 12/02/17 2307)     Initial Impression / Assessment and Plan / ED Course  Triage vital signs and the nursing notes have been reviewed.  Pertinent labs & imaging results that were available during care of the patient were reviewed and considered in medical decision making (see chart for details).   Patient presents well appearing and in no acute distress. She is able to ambulate on her own and does so without assistance or issue. Physical exam is reassuring. With the exception of muscular tenderness, her MSK and neuro exam is normal. There is no midline tenderness, deformities or abnormal neuro findings on exam or in history to suggest an acute spinal cord or osseus pathology that warrant imaging today. No systemic s/s to suggest underlying infectious or rheumatologic etiology.  Final Clinical Impressions(s) / ED Diagnoses  1. Bilateral Back Pain. Rx for Flexeril and ibuprofen given. Education provided on OTC and supportive treatment for relief. Advised to follow-up with PCP if there is no significant change in pain following 4-6 weeks of conservative  therapy.  Dispo: Home. After thorough clinical evaluation, this patient is determined to be medically stable and can be safely discharged with the previously mentioned treatment and/or outpatient follow-up/referral(s). At this time, there are no other apparent medical conditions that require further screening, evaluation or treatment.   Final diagnoses:  Acute bilateral low back pain without sciatica  Neck pain    ED Discharge Orders         Ordered    cyclobenzaprine (FLEXERIL) 5 MG tablet  2 times daily PRN     12/02/17 2301    ibuprofen (ADVIL,MOTRIN) 800 MG tablet  Every 8 hours PRN     12/02/17 2301            Windy Carina, PA-C 12/03/17 1046    Terrilee Files, MD 12/03/17 1451

## 2017-12-02 NOTE — ED Triage Notes (Signed)
Pt states her child's box sping fel on her back while moving it approx 1 week ago-NAD-slow steady gait

## 2017-12-02 NOTE — Discharge Instructions (Addendum)
I have written you a prescription for Flexeril which is a muscle relaxer. This should help if you have muscle spasms. You may use Tylenol and/or Ibuprofen for pain relief and swelling. You may also use warm or cold compresses for additional relief.   You may follow-up with your PCP if you continue to have issues for more than 4-6 weeks.

## 2017-12-02 NOTE — ED Notes (Signed)
Pt requested/received w/c to tx area

## 2018-01-18 ENCOUNTER — Emergency Department (HOSPITAL_BASED_OUTPATIENT_CLINIC_OR_DEPARTMENT_OTHER): Payer: 59

## 2018-01-18 ENCOUNTER — Encounter (HOSPITAL_BASED_OUTPATIENT_CLINIC_OR_DEPARTMENT_OTHER): Payer: Self-pay | Admitting: Emergency Medicine

## 2018-01-18 ENCOUNTER — Other Ambulatory Visit: Payer: Self-pay

## 2018-01-18 ENCOUNTER — Emergency Department (HOSPITAL_BASED_OUTPATIENT_CLINIC_OR_DEPARTMENT_OTHER)
Admission: EM | Admit: 2018-01-18 | Discharge: 2018-01-18 | Disposition: A | Discharge status: Home / Self Care | Payer: 59 | Attending: Emergency Medicine | Admitting: Emergency Medicine

## 2018-01-18 DIAGNOSIS — J45909 Unspecified asthma, uncomplicated: Secondary | ICD-10-CM | POA: Diagnosis not present

## 2018-01-18 DIAGNOSIS — M25562 Pain in left knee: Secondary | ICD-10-CM | POA: Insufficient documentation

## 2018-01-18 DIAGNOSIS — Y939 Activity, unspecified: Secondary | ICD-10-CM | POA: Insufficient documentation

## 2018-01-18 DIAGNOSIS — I10 Essential (primary) hypertension: Secondary | ICD-10-CM | POA: Diagnosis not present

## 2018-01-18 DIAGNOSIS — Y929 Unspecified place or not applicable: Secondary | ICD-10-CM | POA: Insufficient documentation

## 2018-01-18 DIAGNOSIS — Y998 Other external cause status: Secondary | ICD-10-CM | POA: Insufficient documentation

## 2018-01-18 DIAGNOSIS — W19XXXA Unspecified fall, initial encounter: Secondary | ICD-10-CM | POA: Diagnosis not present

## 2018-01-18 DIAGNOSIS — M25572 Pain in left ankle and joints of left foot: Secondary | ICD-10-CM | POA: Insufficient documentation

## 2018-01-18 DIAGNOSIS — Z79899 Other long term (current) drug therapy: Secondary | ICD-10-CM | POA: Insufficient documentation

## 2018-01-18 MED ORDER — OXYCODONE-ACETAMINOPHEN 5-325 MG PO TABS
1.0000 | ORAL_TABLET | Freq: Once | ORAL | Status: AC
  Administered 2018-01-18: 1 via ORAL
  Filled 2018-01-18: qty 1

## 2018-01-18 MED ORDER — OXYCODONE-ACETAMINOPHEN 5-325 MG PO TABS: 1.0000 | ORAL_TABLET | ORAL | 0 refills | Status: DC | PRN

## 2018-01-18 MED FILL — OXYCODONE-ACETAMINOPHEN 5-3: 5-325 | 2 days supply | Qty: 10 | Fill #0

## 2018-01-18 NOTE — ED Triage Notes (Signed)
Reports left knee pain after fall yesterday.  States she fell directly onto left knee.  Currently with left knee swelling which worsened overnight.

## 2018-01-18 NOTE — ED Provider Notes (Signed)
[facility] EMERGENCY DEPARTMENT Provider Note   CSN: [id number] Arrival date & time: 01/18/18  0847     History   Chief Complaint Chief Complaint  Patient presents with  . Knee Pain    HPI Loxley Cibrian is a 32 y.o. female.  The history is provided by the patient and medical records. No language interpreter was used.  Fall  This is a new problem. The current episode started 6 to 12 hours ago. The problem occurs rarely. The problem has been resolved. Pertinent negatives include no chest pain, no abdominal pain, no headaches and no shortness of breath. The symptoms are aggravated by walking. Nothing relieves the symptoms. She has tried nothing for the symptoms. The treatment provided no relief.    Past Medical History:  Diagnosis Date  . Asthma   . H/O transfusion of packed red blood cells   . Hemophilia (HCC)   . HHT (hereditary hemorrhagic telangiectasia) (HCC)   . Hypertension   . Insomnia   . Major depressive disorder   . Panic disorder   . PTSD (post-traumatic stress disorder)     There are no active problems to display for this patient.   Past Surgical History:  Procedure Laterality Date  . ABDOMINAL HYSTERECTOMY    . ABDOMINAL SURGERY    . CESAREAN SECTION     x 3  . DILATION AND CURETTAGE OF UTERUS    . ENDOMETRIAL ABLATION    . NOSE SURGERY    . TUBAL LIGATION       OB History   None      Home Medications    Prior to Admission medications   Medication Sig Start Date End Date Taking? Authorizing Provider  CLONAZEPAM PO Take by mouth.   Yes [provider]  FLUoxetine HCl (PROZAC PO) Take by mouth.   Yes [provider]  Mirtazapine (REMERON PO) Take by mouth.   Yes [provider]  TRAZODONE HCL PO Take by mouth.   Yes [provider]  acetaminophen (TYLENOL) 500 MG tablet Take 500 mg by mouth every 6 (six) hours as needed.    [provider]  albuterol (PROVENTIL HFA;VENTOLIN HFA) 108  (90 Base) MCG/ACT inhaler Inhale 2 puffs into the lungs every 4 (four) hours as needed for wheezing or shortness of breath. 04/19/17   Doug Sou, MD  albuterol (PROVENTIL) (2.5 MG/3ML) 0.083% nebulizer solution Take 3 mLs (2.5 mg total) by nebulization every 4 (four) hours as needed for wheezing or shortness of breath. 04/19/17   Doug Sou, MD  ALPRAZolam (XANAX PO) Take by mouth.    [provider]  Citalopram Hydrobromide (CELEXA PO) Take by mouth.    [provider]  diphenhydrAMINE (BENADRYL) 25 MG tablet Take 1 tablet (25 mg total) by mouth every 6 (six) hours as needed for itching. 07/25/16   Loren Racer, MD  HYDROcodone-acetaminophen (NORCO/VICODIN) 5-325 MG tablet Take 1-2 tablets by mouth every 6 (six) hours as needed. 07/30/17   Renne Crigler, PA-C  levocetirizine (XYZAL) 5 MG tablet Take 1 tablet (5 mg total) by mouth every evening. 10/07/17   Harris, Cammy Copa, PA-C  LOSARTAN POTASSIUM PO Take by mouth.    [provider]  meclizine (ANTIVERT) 25 MG tablet Take 1 tablet (25 mg total) by mouth 3 (three) times daily as needed for dizziness. 09/18/16   Little, Ambrose Finland, MD  Montelukast Sodium (SINGULAIR PO) Take by mouth.    [provider]  ondansetron (ZOFRAN ODT) 4  MG disintegrating tablet Take 1 tablet (4 mg total) by mouth every 8 (eight) hours as needed for nausea or vomiting. 07/30/17   Renne Crigler, PA-C  ondansetron (ZOFRAN) 4 MG tablet Take 1 tablet (4 mg total) by mouth every 8 (eight) hours as needed for nausea or vomiting. 10/26/15   Reymundo Poll, MD  OVER THE COUNTER MEDICATION OTC Migraine medications    [provider]  phentermine 37.5 MG capsule Take 37.5 mg by mouth every morning.    [provider]  predniSONE (DELTASONE) 20 MG tablet 2 tabs po daily x 4 days 04/19/17   Doug Sou, MD    Family History History reviewed. No pertinent family history.  Social History Social History   Tobacco Use   . Smoking status: Never Smoker  . Smokeless tobacco: Never Used  Substance Use Topics  . Alcohol use: Yes    Comment: occassionally  . Drug use: No     Allergies   Iron and Penicillins   Review of Systems Review of Systems  Constitutional: Negative for chills, diaphoresis and fever.  HENT: Negative for congestion, ear pain and sore throat.   Eyes: Negative for pain and visual disturbance.  Respiratory: Negative for cough, chest tightness and shortness of breath.   Cardiovascular: Negative for chest pain and palpitations.  Gastrointestinal: Negative for abdominal pain and vomiting.  Genitourinary: Negative for dysuria and hematuria.  Musculoskeletal: Negative for arthralgias, back pain, neck pain and neck stiffness.  Skin: Negative for color change and rash.  Neurological: Negative for seizures, syncope, light-headedness and headaches.  Psychiatric/Behavioral: Negative for agitation.  All other systems reviewed and are negative.    Physical Exam Updated Vital Signs BP 128/83 (BP Location: Left Arm)   Pulse 93   Temp 98.5 F (36.9 C) (Oral)   Resp 18   Ht 5\' 5"  (1.651 m)   Wt 97.5 kg   LMP 10/19/2015   SpO2 96%   BMI 35.78 kg/m   Physical Exam  Constitutional: She is oriented to person, place, and time. She appears well-developed and well-nourished. No distress.  HENT:  Head: Normocephalic.  Nose: Nose normal.  Mouth/Throat: Oropharynx is clear and moist. No oropharyngeal exudate.  Eyes: Pupils are equal, round, and reactive to light. Conjunctivae and EOM are normal.  Neck: Normal range of motion. Neck supple.  Cardiovascular: Normal rate and intact distal pulses.  No murmur heard. Pulmonary/Chest: Effort normal and breath sounds normal. No stridor. No respiratory distress. She has no wheezes. She has no rales. She exhibits no tenderness.  Abdominal: Soft. Bowel sounds are normal. She exhibits no distension.  Musculoskeletal: Normal range of motion. She  exhibits tenderness. She exhibits no edema or deformity.       Left knee: She exhibits normal range of motion, no swelling, no effusion, no laceration and no erythema. Tenderness found.       Left ankle: She exhibits normal range of motion, no swelling, no laceration and normal pulse. Tenderness.       Legs: Lymphadenopathy:    She has no cervical adenopathy.  Neurological: She is alert and oriented to person, place, and time. She displays normal reflexes. No cranial nerve deficit or sensory deficit. She exhibits normal muscle tone. Coordination normal.  Skin: Skin is warm. Capillary refill takes less than 2 seconds. No rash noted. She is not diaphoretic. No erythema.  Nursing note and vitals reviewed.    ED Treatments / Results  Labs (all labs ordered are listed, but only abnormal  results are displayed) Labs Reviewed - No data to display  EKG None  Radiology Dg Ankle Complete Left  Result Date: 01/18/2018 CLINICAL DATA:  Fall, left ankle pain EXAM: LEFT ANKLE COMPLETE - 3+ VIEW COMPARISON:  None FINDINGS: There is no evidence of fracture, dislocation, or joint effusion. There is no evidence of arthropathy or other focal bone abnormality. Soft tissues are unremarkable. IMPRESSION: Negative. Electronically Signed   By: Charlett Nose M.D.   On: 01/18/2018 10:04   Dg Knee Complete 4 Views Left  Result Date: 01/18/2018 CLINICAL DATA:  Left knee pain following fall, initial encounter EXAM: LEFT KNEE - COMPLETE 4+ VIEW COMPARISON:  None. FINDINGS: No evidence of fracture, dislocation, or joint effusion. No evidence of arthropathy or other focal bone abnormality. Soft tissues are unremarkable. IMPRESSION: No acute abnormality noted. Electronically Signed   By: Alcide Clever M.D.   On: 01/18/2018 09:21    Procedures Procedures (including critical care time)  Medications Ordered in ED Medications  oxyCODONE-acetaminophen (PERCOCET/ROXICET) 5-325 MG per tablet 1 tablet (1 tablet Oral Given  01/18/18 0957)     Initial Impression / Assessment and Plan / ED Course  I have reviewed the triage vital signs and the nursing notes.  Pertinent labs & imaging results that were available during my care of the patient were reviewed by me and considered in my medical decision making (see chart for details).     Zelma Snead is a 32 y.o. female with a past medical significant for asthma, PTSD, hypertension, and hemophilia who presents with a fall.  Patient reports that last night she actually rolled out of bed striking her left knee and leg on the ground and hitting her left temple on a dresser.  She reports her headache has resolved.  She denies neck pain, back pain, chest pain or abdominal pain.  Her primary concern is pain in her left knee and left ankle.  She reports the pain as severe, up to a 9 out of 10 severity.  She reports no urinary symptoms or other GI symptoms.  She reports pain in her left ankle.  She was having pain with ambulation.  She reports some tingling but denies weakness.  No lacerations reported.  Next  On exam, patient is tenderness in her left knee with movement and with palpation.  No crepitance.  Ankle is also tender to palpation diffusely.  Normal pulse and cap refill.  Tingling sensation appreciated however no true numbness seen.  Patient had no laceration observed.  No hip tenderness or pain with hip manipulation.  No back tenderness.  Head showed no tenderness or crepitance.  Neck nontender.  Lungs clear chest is nontender.  Based on the trauma, patient had x-ray of her left knee which showed no fracture dislocation.  No evidence of compartment syndrome on exam.  No effusion was seen in the knee.  No swelling appreciated.  Suspect the tingling due to irritation from the injury.  Without weakness, doubt nerve injury   The at this time.  Next  X-ray will be obtained of the ankle.  Knee immobilizer will be placed and patient be given crutches.  Patient will likely be  given prescription for pain medication and follow-up with an out patient orthopedist for further work-up of soft tissue or ligamentous injury of the knee.  Anticipate discharge if x-ray is reassuring.     10:27 AM Ankle x-ray showed no fracture.  Patient was able to ambulate with crutches and knee immobilizer.  Patient  will follow-up with orthopedics for further work-up of her soft tissue knee injury.  Patient will give prescription for pain medicine and work note.    Patient reports improvement in pain after medication and knee brace.  Calf is still soft with soft compartments.  No evidence of compartment syndrome.  Patient will follow-up with PCP and orthopedic patient discharged in good condition with improved symptoms.  Final Clinical Impressions(s) / ED Diagnoses   Final diagnoses:  Acute pain of left knee  Fall, initial encounter  Acute left ankle pain    ED Discharge Orders    None      Clinical Impression: 1. Acute pain of left knee   2. Fall, initial encounter   3. Acute left ankle pain     Disposition: Discharge  Condition: Good  I have discussed the results, Dx and Tx plan with the pt(& family if present). He/she/they expressed understanding and agree(s) with the plan. Discharge instructions discussed at great length. Strict return precautions discussed and pt &/or family have verbalized understanding of the instructions. No further questions at time of discharge.    New Prescriptions   OXYCODONE-ACETAMINOPHEN (PERCOCET/ROXICET) 5-325 MG TABLET    Take 1 tablet by mouth every 4 (four) hours as needed for severe pain.    Follow Up: Lenda Kelp, MD [address] [place] [address] [phone]   Orthopedics  [facility] [address] [id number] QI [id number] [place] [address] [phone]       Tegeler, Canary Brim, MD 01/18/18 1030

## 2018-01-18 NOTE — Discharge Instructions (Signed)
Your x-ray today showed no evidence of fracture or dislocation in the knee or the ankle.  I suspect given the tenderness after the fall you have soft tissue or ligamentous injury.  Please use the knee immobilizer and crutches.  Please do not bear significant weight on the leg for the next few days.  Please use the pain medicine to help with your symptoms.  Please follow-up with orthopedics for further work-up.  If any symptoms change or worsen, please return to the nearest emergency department.  Please try to keep it elevated as best she can.

## 2018-01-21 ENCOUNTER — Ambulatory Visit: Payer: 59 | Admitting: Family Medicine

## 2018-01-21 ENCOUNTER — Encounter: Payer: Self-pay | Admitting: Family Medicine

## 2018-01-21 VITALS — BP 157/123 | HR 80 | Ht 66.0 in | Wt 215.0 lb

## 2018-01-21 DIAGNOSIS — M25562 Pain in left knee: Secondary | ICD-10-CM | POA: Diagnosis not present

## 2018-01-21 MED ORDER — MELOXICAM 15 MG PO TABS: 15.0000 mg | ORAL_TABLET | Freq: Every day | ORAL | 2 refills | Status: DC

## 2018-01-21 MED ORDER — HYDROCODONE-ACETAMINOPHEN 5-325 MG PO TABS: 1.0000 | ORAL_TABLET | Freq: Four times a day (QID) | ORAL | 0 refills | Status: DC | PRN

## 2018-01-21 NOTE — Progress Notes (Signed)
PCP: Anne Shutter, PA  Subjective:   HPI: Patient is a 32 y.o. female here for left knee pain.  Patient reports 10/10 sharp knee pain following an injury on 01/17/2018.  Patient states she woke up at night and fell onto her left knee.  She is unsure if she twisted or directly fell onto the knee.  She went to stand up and noticed sharp knee pain.  She then went to the emergency department.  Since that time she has been in the knee immobilizer.  She has been using ibuprofen and Percocet as needed for pain.  Today, she continues to have significant pain over the anterior medial aspect of the knee.  She reports continued swelling.  She does not report any erythema or bruising.  No skin changes.  No numbness.  Past Medical History:  Diagnosis Date  . Asthma   . H/O transfusion of packed red blood cells   . Hemophilia (HCC)   . HHT (hereditary hemorrhagic telangiectasia) (HCC)   . Hypertension   . Insomnia   . Major depressive disorder   . Panic disorder   . PTSD (post-traumatic stress disorder)     Current Outpatient Medications on File Prior to Visit  Medication Sig Dispense Refill  . acetaminophen (TYLENOL) 500 MG tablet Take 500 mg by mouth every 6 (six) hours as needed.    Marland Kitchen albuterol (PROVENTIL HFA;VENTOLIN HFA) 108 (90 Base) MCG/ACT inhaler Inhale 2 puffs into the lungs every 4 (four) hours as needed for wheezing or shortness of breath. 1 Inhaler 0  . albuterol (PROVENTIL) (2.5 MG/3ML) 0.083% nebulizer solution Take 3 mLs (2.5 mg total) by nebulization every 4 (four) hours as needed for wheezing or shortness of breath. 75 mL 12  . ALPRAZolam (XANAX PO) Take by mouth.    . Citalopram Hydrobromide (CELEXA PO) Take by mouth.    . CLONAZEPAM PO Take by mouth.    . diphenhydrAMINE (BENADRYL) 25 MG tablet Take 1 tablet (25 mg total) by mouth every 6 (six) hours as needed for itching. 20 tablet 0  . FLUoxetine HCl (PROZAC PO) Take by mouth.    . levocetirizine (XYZAL) 5 MG tablet Take 1 tablet  (5 mg total) by mouth every evening. 30 tablet 0  . LOSARTAN POTASSIUM PO Take by mouth.    . meclizine (ANTIVERT) 25 MG tablet Take 1 tablet (25 mg total) by mouth 3 (three) times daily as needed for dizziness. 6 tablet 0  . Mirtazapine (REMERON PO) Take by mouth.    . Montelukast Sodium (SINGULAIR PO) Take by mouth.    . ondansetron (ZOFRAN ODT) 4 MG disintegrating tablet Take 1 tablet (4 mg total) by mouth every 8 (eight) hours as needed for nausea or vomiting. 10 tablet 0  . ondansetron (ZOFRAN) 4 MG tablet Take 1 tablet (4 mg total) by mouth every 8 (eight) hours as needed for nausea or vomiting. 20 tablet 0  . OVER THE COUNTER MEDICATION OTC Migraine medications    . oxyCODONE-acetaminophen (PERCOCET/ROXICET) 5-325 MG tablet Take 1 tablet by mouth every 4 (four) hours as needed for severe pain. 10 tablet 0  . phentermine 37.5 MG capsule Take 37.5 mg by mouth every morning.    . predniSONE (DELTASONE) 20 MG tablet 2 tabs po daily x 4 days 8 tablet 0  . TRAZODONE HCL PO Take by mouth.     No current facility-administered medications on file prior to visit.     Past Surgical History:  Procedure Laterality Date  .  ABDOMINAL HYSTERECTOMY    . ABDOMINAL SURGERY    . CESAREAN SECTION     x 3  . DILATION AND CURETTAGE OF UTERUS    . ENDOMETRIAL ABLATION    . NOSE SURGERY    . TUBAL LIGATION      Allergies  Allergen Reactions  . Iron Shortness Of Breath  . Penicillins Hives    Social History   Socioeconomic History  . Marital status: Married    Spouse name: Not on file  . Number of children: Not on file  . Years of education: Not on file  . Highest education level: Not on file  Occupational History  . Not on file  Social Needs  . Financial resource strain: Not on file  . Food insecurity:    Worry: Not on file    Inability: Not on file  . Transportation needs:    Medical: Not on file    Non-medical: Not on file  Tobacco Use  . Smoking status: Never Smoker  .  Smokeless tobacco: Never Used  Substance and Sexual Activity  . Alcohol use: Yes    Comment: occassionally  . Drug use: No  . Sexual activity: Yes    Birth control/protection: Surgical  Lifestyle  . Physical activity:    Days per week: Not on file    Minutes per session: Not on file  . Stress: Not on file  Relationships  . Social connections:    Talks on phone: Not on file    Gets together: Not on file    Attends religious service: Not on file    Active member of club or organization: Not on file    Attends meetings of clubs or organizations: Not on file    Relationship status: Not on file  . Intimate partner violence:    Fear of current or ex partner: Not on file    Emotionally abused: Not on file    Physically abused: Not on file    Forced sexual activity: Not on file  Other Topics Concern  . Not on file  Social History Narrative  . Not on file    No family history on file.  BP (!) 157/123   Pulse 80   Ht 5\' 6"  (1.676 m)   Wt 215 lb (97.5 kg)   LMP 10/19/2015   BMI 34.70 kg/m   Review of Systems: See HPI above.     Objective:  Physical Exam:  Gen: awake, alert, NAD, comfortable in exam room Pulm: breathing unlabored  MSK: Exam of the left knee is limited due to patient guarding No obvious deformity.  Effusion present. Tenderness over the patella and medial knee as well as the quadriceps tendon Patient is able to fully extend the knee.  Flexion not tested due to pain Strength testing deferred however patient is able to maintain extension of the knee with flexion of the hip. N/V intact No medial or lateral collateral laxity.  Unable to adequately evaluate ACL or PCL.  Unable to adequately evaluate the meniscus.  Right knee: No deformity or swelling No TTP Full ROM with 5/5 strength NV intact MCL/LCL intact   Assessment & Plan:  1.  Left knee pain: Secondary to fall.  Suspect bony contusion given mechanism, location of pain, normal radiographs.   X-rays from the emergency department reviewed today and showed no acute bony normality.  Cannot rule out ligamentous or meniscal pathology due to pain and guarding on exam. - Continue in knee immobilizer and  attempt to wean out of this. - Home range of motion exercises - Meloxicam 15 mg daily - Norco as needed for severe pain - Ice 15 minutes 3-4 times a day - We will have her follow-up in 2 weeks to repeat evaluation

## 2018-01-21 NOTE — Patient Instructions (Addendum)
Wear the immobilizer when up and walking around - transition to knee brace when tolerated. Do motion exercises at least a couple times a day (towel slides, table hangs like we discussed) to try to regain your motion. Meloxicam 15mg  daily with food for pain and inflammation. Norco as needed for severe pain (no driving on this medication). Icing 15 minutes at a time 3-4 times a day. Elevate above your heart as much as possible to help with swelling. Follow up with me in 2 weeks for reevaluation.

## 2018-01-22 ENCOUNTER — Encounter: Payer: Self-pay | Admitting: Family Medicine

## 2018-02-04 ENCOUNTER — Ambulatory Visit: Payer: 59 | Admitting: Family Medicine

## 2018-02-12 ENCOUNTER — Ambulatory Visit: Payer: 59 | Admitting: Family Medicine

## 2018-02-23 ENCOUNTER — Ambulatory Visit: Payer: 59 | Admitting: Family Medicine

## 2018-03-03 ENCOUNTER — Emergency Department (HOSPITAL_BASED_OUTPATIENT_CLINIC_OR_DEPARTMENT_OTHER)
Admission: EM | Admit: 2018-03-03 | Discharge: 2018-03-04 | Disposition: A | Discharge status: Home / Self Care | Payer: 59 | Attending: Emergency Medicine | Admitting: Emergency Medicine

## 2018-03-03 ENCOUNTER — Encounter (HOSPITAL_BASED_OUTPATIENT_CLINIC_OR_DEPARTMENT_OTHER): Payer: Self-pay | Admitting: Emergency Medicine

## 2018-03-03 ENCOUNTER — Other Ambulatory Visit: Payer: Self-pay

## 2018-03-03 DIAGNOSIS — R6889 Other general symptoms and signs: Secondary | ICD-10-CM

## 2018-03-03 DIAGNOSIS — I10 Essential (primary) hypertension: Secondary | ICD-10-CM | POA: Diagnosis not present

## 2018-03-03 DIAGNOSIS — Z79899 Other long term (current) drug therapy: Secondary | ICD-10-CM | POA: Insufficient documentation

## 2018-03-03 DIAGNOSIS — Z91040 Latex allergy status: Secondary | ICD-10-CM | POA: Diagnosis not present

## 2018-03-03 DIAGNOSIS — J111 Influenza due to unidentified influenza virus with other respiratory manifestations: Secondary | ICD-10-CM | POA: Diagnosis not present

## 2018-03-03 DIAGNOSIS — J029 Acute pharyngitis, unspecified: Secondary | ICD-10-CM | POA: Diagnosis present

## 2018-03-03 DIAGNOSIS — J45909 Unspecified asthma, uncomplicated: Secondary | ICD-10-CM | POA: Diagnosis not present

## 2018-03-03 NOTE — ED Triage Notes (Signed)
Pt reports sore throat, body aches, productive cough starting tonight. Reports son is sick as well.

## 2018-03-03 NOTE — ED Notes (Signed)
Sore  Throat, body aches,  X onset this am    Hot/ chills off and on

## 2018-03-04 LAB — GROUP A STREP BY PCR: Group A Strep by PCR: NOT DETECTED

## 2018-03-04 NOTE — ED Notes (Signed)
Pt verbalizes understanding of d/c instructions and denies any further needs at this time. 

## 2018-03-04 NOTE — ED Provider Notes (Signed)
[facility] EMERGENCY DEPARTMENT Provider Note   CSN: [id number] Arrival date & time: 03/03/18  2326     History   Chief Complaint Chief Complaint  Patient presents with  . Sore Throat    HPI Tanya Herrera is a 32 y.o. female.  The history is provided by the patient.  Sore Throat  This is a new problem. The current episode started 12 to 24 hours ago. The problem occurs constantly. The problem has been gradually worsening. Associated symptoms include headaches. Pertinent negatives include no chest pain. The symptoms are aggravated by swallowing. Nothing relieves the symptoms.   Patient reports cough, sore throat, body aches, diaphoresis, headache over the past 24 hours.  She reports her son was sick with similar symptoms.  No recorded fevers, but has hot flashes. She also reports some diarrhea Past Medical History:  Diagnosis Date  . Asthma   . H/O transfusion of packed red blood cells   . Hemophilia (HCC)   . HHT (hereditary hemorrhagic telangiectasia) (HCC)   . Hypertension   . Insomnia   . Major depressive disorder   . Panic disorder   . PTSD (post-traumatic stress disorder)     Patient Active Problem List   Diagnosis Date Noted  . Asthma 01/25/2015  . Gastro-esophageal reflux disease without esophagitis 01/25/2015  . Obesity with body mass index 30 or greater 08/04/2014  . Allergic rhinitis due to pollen 06/23/2014  . Anxiety 07/27/2013  . Essential (primary) hypertension 07/27/2013  . Anemia 06/28/2013  . Major depressive disorder, single episode, unspecified 11/26/2011  . Migraine without aura 07/16/2011    Past Surgical History:  Procedure Laterality Date  . ABDOMINAL HYSTERECTOMY    . ABDOMINAL SURGERY    . CESAREAN SECTION     x 3  . DILATION AND CURETTAGE OF UTERUS    . ENDOMETRIAL ABLATION    . NOSE SURGERY    . TUBAL LIGATION       OB History   None      Home Medications    Prior to Admission medications   Medication Sig  Start Date End Date Taking? Authorizing Provider  albuterol (PROVENTIL HFA;VENTOLIN HFA) 108 (90 Base) MCG/ACT inhaler Inhale 2 puffs into the lungs every 4 (four) hours as needed for wheezing or shortness of breath. 04/19/17   Doug SouJacubowitz, Sam, MD  albuterol (PROVENTIL) (2.5 MG/3ML) 0.083% nebulizer solution Take 3 mLs (2.5 mg total) by nebulization every 4 (four) hours as needed for wheezing or shortness of breath. 04/19/17   Doug SouJacubowitz, Sam, MD  ALPRAZolam (XANAX PO) Take by mouth.    [provider]  Citalopram Hydrobromide (CELEXA PO) Take by mouth.    [provider]  clonazePAM (KLONOPIN) 1 MG tablet Take 1 mg by mouth 2 (two) times daily as needed. 01/04/18   [provider]  FLUoxetine (PROZAC) 10 MG capsule TAKE ONE CAPSULE BY MOUTH DAILY AS DIRECTED 01/04/18   [provider]  HYDROcodone-acetaminophen (NORCO) 5-325 MG tablet Take 1 tablet by mouth every 6 (six) hours as needed for moderate pain. 01/21/18   Hudnall, Azucena FallenShane R, MD  levocetirizine (XYZAL) 5 MG tablet Take 1 tablet (5 mg total) by mouth every evening. 10/07/17   Harris, Cammy CopaAbigail, PA-C  LOSARTAN POTASSIUM PO Take by mouth.    [provider]  meloxicam (MOBIC) 15 MG tablet Take 1 tablet (15 mg total) by mouth daily. 01/21/18   Hudnall, Azucena FallenShane R, MD  mirtazapine (REMERON) 15 MG tablet TAKE ONE TABLET BY  MOUTH AT BEDTIME AS DIRECTED 01/04/18   [provider]  mometasone (NASONEX) 50 MCG/ACT nasal spray 2 sprays. 01/19/16   [provider]  Montelukast Sodium (SINGULAIR PO) Take by mouth.    [provider]  ondansetron (ZOFRAN ODT) 4 MG disintegrating tablet Take 1 tablet (4 mg total) by mouth every 8 (eight) hours as needed for nausea or vomiting. 07/30/17   Renne Crigler, PA-C  phentermine 37.5 MG capsule Take 37.5 mg by mouth every morning.    [provider]  predniSONE (DELTASONE) 20 MG tablet 2 tabs po daily x 4 days 04/19/17   Doug Sou, MD    TRAZODONE HCL PO Take by mouth.    [provider]  Vitamin D, Ergocalciferol, (DRISDOL) 50000 units CAPS capsule TAKE ONE CAPSULE BY MOUTH ONCE A WEEK 12/21/17   [provider]    Family History History reviewed. No pertinent family history.  Social History Social History   Tobacco Use  . Smoking status: Never Smoker  . Smokeless tobacco: Never Used  Substance Use Topics  . Alcohol use: Yes    Comment: occassionally  . Drug use: No     Allergies   Hydrocodone; Iron; Iron dextran; Latex; Banana; and Penicillins   Review of Systems Review of Systems  Constitutional: Positive for chills, diaphoresis and fatigue.  HENT: Positive for sore throat.   Respiratory: Positive for cough.   Cardiovascular: Negative for chest pain.  Gastrointestinal: Positive for diarrhea.  Musculoskeletal: Positive for myalgias. Negative for neck stiffness.  Neurological: Positive for headaches.  All other systems reviewed and are negative.    Physical Exam Updated Vital Signs BP (!) 139/92 (BP Location: Right Arm)   Pulse 83   Temp 98.3 F (36.8 C)   Resp 18   Ht 1.676 m (5\' 6" )   Wt 95.1 kg   LMP 10/19/2015   SpO2 98%   BMI 33.85 kg/m   Physical Exam CONSTITUTIONAL: Well developed/well nourished HEAD: Normocephalic/atraumatic EYES: EOMI/PERRL ENMT: Mucous membranes moist, uvula midline without erythema or exudate NECK: supple no meningeal signs SPINE/BACK:entire spine nontender CV: S1/S2 noted, no murmurs/rubs/gallops noted LUNGS: Lungs are clear to auscultation bilaterally, no apparent distress ABDOMEN: soft, nontender, no rebound or guarding, bowel sounds noted throughout abdomen GU:no cva tenderness NEURO: Pt is awake/alert/appropriate, moves all extremitiesx4.  No facial droop.   EXTREMITIES: pulses normal/equal, full ROM SKIN: warm, color normal PSYCH: no abnormalities of mood noted, alert and oriented to situation   ED Treatments / Results   Labs (all labs ordered are listed, but only abnormal results are displayed) Labs Reviewed  GROUP A STREP BY PCR    EKG None  Radiology No results found.  Procedures Procedures (including critical care time)  Medications Ordered in ED Medications - No data to display   Initial Impression / Assessment and Plan / ED Course  I have reviewed the triage vital signs and the nursing notes.     PT Nontoxic and well-appearing.  Vitals are appropriate, no hypoxia.  Strong suspicion for influenza given history and time of season.  Advised rest, fluids, we discussed strict return precautions.  Final Clinical Impressions(s) / ED Diagnoses   Final diagnoses:  Flu-like symptoms    ED Discharge Orders    None       Zadie Rhine, MD 03/04/18 [phone]

## 2018-04-07 ENCOUNTER — Encounter (HOSPITAL_BASED_OUTPATIENT_CLINIC_OR_DEPARTMENT_OTHER): Payer: Self-pay | Admitting: *Deleted

## 2018-04-07 ENCOUNTER — Other Ambulatory Visit: Payer: Self-pay

## 2018-04-07 ENCOUNTER — Emergency Department (HOSPITAL_BASED_OUTPATIENT_CLINIC_OR_DEPARTMENT_OTHER)
Admission: EM | Admit: 2018-04-07 | Discharge: 2018-04-07 | Disposition: A | Discharge status: Home / Self Care | Payer: 59 | Attending: Emergency Medicine | Admitting: Emergency Medicine

## 2018-04-07 DIAGNOSIS — Y929 Unspecified place or not applicable: Secondary | ICD-10-CM | POA: Diagnosis not present

## 2018-04-07 DIAGNOSIS — I10 Essential (primary) hypertension: Secondary | ICD-10-CM | POA: Diagnosis not present

## 2018-04-07 DIAGNOSIS — W57XXXA Bitten or stung by nonvenomous insect and other nonvenomous arthropods, initial encounter: Secondary | ICD-10-CM | POA: Insufficient documentation

## 2018-04-07 DIAGNOSIS — S40862A Insect bite (nonvenomous) of left upper arm, initial encounter: Secondary | ICD-10-CM | POA: Diagnosis present

## 2018-04-07 DIAGNOSIS — Y939 Activity, unspecified: Secondary | ICD-10-CM | POA: Diagnosis not present

## 2018-04-07 DIAGNOSIS — Z91040 Latex allergy status: Secondary | ICD-10-CM | POA: Insufficient documentation

## 2018-04-07 DIAGNOSIS — Z79899 Other long term (current) drug therapy: Secondary | ICD-10-CM | POA: Diagnosis not present

## 2018-04-07 DIAGNOSIS — J45909 Unspecified asthma, uncomplicated: Secondary | ICD-10-CM | POA: Diagnosis not present

## 2018-04-07 DIAGNOSIS — Y999 Unspecified external cause status: Secondary | ICD-10-CM | POA: Insufficient documentation

## 2018-04-07 DIAGNOSIS — L03114 Cellulitis of left upper limb: Secondary | ICD-10-CM | POA: Insufficient documentation

## 2018-04-07 MED ORDER — DIPHENHYDRAMINE HCL 25 MG PO CAPS
25.0000 mg | ORAL_CAPSULE | Freq: Once | ORAL | Status: AC
  Administered 2018-04-07: 25 mg via ORAL
  Filled 2018-04-07: qty 1

## 2018-04-07 MED ORDER — NAPROXEN 250 MG PO TABS
500.0000 mg | ORAL_TABLET | Freq: Once | ORAL | Status: AC
  Administered 2018-04-07: 500 mg via ORAL
  Filled 2018-04-07: qty 2

## 2018-04-07 MED ORDER — CLINDAMYCIN HCL 150 MG PO CAPS: 300.0000 mg | ORAL_CAPSULE | Freq: Four times a day (QID) | ORAL | 0 refills | Status: DC

## 2018-04-07 MED ORDER — NAPROXEN 500 MG PO TABS: 500.0000 mg | ORAL_TABLET | Freq: Two times a day (BID) | ORAL | 0 refills | Status: DC

## 2018-04-07 MED FILL — CLINDAMYCIN HCL 150 MG CAPS: 150 | 6 days supply | Qty: 42 | Fill #0

## 2018-04-07 MED FILL — NAPROXEN 500 MG TABLET: 500 | 5 days supply | Qty: 10 | Fill #0

## 2018-04-07 NOTE — ED Triage Notes (Signed)
Pt was bitten by "something yesterday on her left arm, it has been irritating her badly.

## 2018-04-07 NOTE — ED Provider Notes (Signed)
[facility] EMERGENCY DEPARTMENT Provider Note   CSN: [id number] Arrival date & time: 04/07/18  1119     History   Chief Complaint Chief Complaint  Patient presents with  . Insect Bite    HPI Tanya Herrera is a 33 y.o. female with a hx of hemophilia, HHT, HTN, insomnia, PTSD, and asthma who presents to the ED with concern for insect bite that occurred this AM.  Patient states she did not specifically feel anything bite her, but noted a small break in the skin of the L upper arm. She states the area was itchy/burning and then she developed spreading surrounding redness. She states her current discomfort is a 9/10 in severity, no alleviating/aggravating factors. No intervention PTA. Denies fever, chills, numbness, tingling, weakness, swelling, nausea, or vomiting. Denies chance of pregnancy.   HPI  Past Medical History:  Diagnosis Date  . Asthma   . H/O transfusion of packed red blood cells   . Hemophilia (HCC)   . HHT (hereditary hemorrhagic telangiectasia) (HCC)   . Hypertension   . Insomnia   . Major depressive disorder   . Panic disorder   . PTSD (post-traumatic stress disorder)     Patient Active Problem List   Diagnosis Date Noted  . Asthma 01/25/2015  . Gastro-esophageal reflux disease without esophagitis 01/25/2015  . Obesity with body mass index 30 or greater 08/04/2014  . Allergic rhinitis due to pollen 06/23/2014  . Anxiety 07/27/2013  . Essential (primary) hypertension 07/27/2013  . Anemia 06/28/2013  . Major depressive disorder, single episode, unspecified 11/26/2011  . Migraine without aura 07/16/2011    Past Surgical History:  Procedure Laterality Date  . ABDOMINAL HYSTERECTOMY    . ABDOMINAL SURGERY    . CESAREAN SECTION     x 3  . DILATION AND CURETTAGE OF UTERUS    . ENDOMETRIAL ABLATION    . NOSE SURGERY    . TUBAL LIGATION       OB History   No obstetric history on file.      Home Medications    Prior to Admission  medications   Medication Sig Start Date End Date Taking? Authorizing Provider  albuterol (PROVENTIL HFA;VENTOLIN HFA) 108 (90 Base) MCG/ACT inhaler Inhale 2 puffs into the lungs every 4 (four) hours as needed for wheezing or shortness of breath. 04/19/17   Doug SouJacubowitz, Sam, MD  albuterol (PROVENTIL) (2.5 MG/3ML) 0.083% nebulizer solution Take 3 mLs (2.5 mg total) by nebulization every 4 (four) hours as needed for wheezing or shortness of breath. 04/19/17   Doug SouJacubowitz, Sam, MD  ALPRAZolam (XANAX PO) Take by mouth.    [provider]  Citalopram Hydrobromide (CELEXA PO) Take by mouth.    [provider]  clonazePAM (KLONOPIN) 1 MG tablet Take 1 mg by mouth 2 (two) times daily as needed. 01/04/18   [provider]  FLUoxetine (PROZAC) 10 MG capsule TAKE ONE CAPSULE BY MOUTH DAILY AS DIRECTED 01/04/18   [provider]  HYDROcodone-acetaminophen (NORCO) 5-325 MG tablet Take 1 tablet by mouth every 6 (six) hours as needed for moderate pain. 01/21/18   Hudnall, Azucena FallenShane R, MD  levocetirizine (XYZAL) 5 MG tablet Take 1 tablet (5 mg total) by mouth every evening. 10/07/17   Harris, Cammy CopaAbigail, PA-C  LOSARTAN POTASSIUM PO Take by mouth.    [provider]  meloxicam (MOBIC) 15 MG tablet Take 1 tablet (15 mg total) by mouth daily. 01/21/18   Lenda KelpHudnall, Shane R, MD  mirtazapine (REMERON) 15 MG  tablet TAKE ONE TABLET BY MOUTH AT BEDTIME AS DIRECTED 01/04/18   [provider]  mometasone (NASONEX) 50 MCG/ACT nasal spray 2 sprays. 01/19/16   [provider]  Montelukast Sodium (SINGULAIR PO) Take by mouth.    [provider]  ondansetron (ZOFRAN ODT) 4 MG disintegrating tablet Take 1 tablet (4 mg total) by mouth every 8 (eight) hours as needed for nausea or vomiting. 07/30/17   Renne CriglerGeiple, Joshua, PA-C  phentermine 37.5 MG capsule Take 37.5 mg by mouth every morning.    [provider]  predniSONE (DELTASONE) 20 MG tablet 2 tabs po daily x 4 days  04/19/17   Doug SouJacubowitz, Sam, MD  TRAZODONE HCL PO Take by mouth.    [provider]  Vitamin D, Ergocalciferol, (DRISDOL) 50000 units CAPS capsule TAKE ONE CAPSULE BY MOUTH ONCE A WEEK 12/21/17   [provider]    Family History History reviewed. No pertinent family history.  Social History Social History   Tobacco Use  . Smoking status: Never Smoker  . Smokeless tobacco: Never Used  Substance Use Topics  . Alcohol use: Yes    Comment: occassionally  . Drug use: No     Allergies   Hydrocodone; Iron; Iron dextran; Latex; Banana; and Penicillins   Review of Systems Review of Systems  Constitutional: Negative for chills and fever.  Gastrointestinal: Negative for nausea and vomiting.  Skin: Positive for color change and wound.  Neurological: Negative for weakness and numbness.       Negative for paresthesias.      Physical Exam Updated Vital Signs BP 127/78 (BP Location: Right Arm)   Pulse 70   Temp 98.4 F (36.9 C) (Oral)   Resp 16   Ht 5\' 6"  (1.676 m)   Wt 93 kg   LMP 10/19/2015   SpO2 100%   BMI 33.09 kg/m   Physical Exam Vitals signs and nursing note reviewed.  Constitutional:      General: She is not in acute distress.    Appearance: She is well-developed.  HENT:     Head: Normocephalic and atraumatic.     Comments: No facial or oropharynx swelling. Posterior oropharynx is symmetric appearing. Patient tolerating own secretions without difficulty. Patent airway. No trismus. No drooling. No hot potato voice. No swelling beneath the tongue, submandibular compartment is soft.  Eyes:     General:        Right eye: No discharge.        Left eye: No discharge.     Conjunctiva/sclera: Conjunctivae normal.  Neck:     Musculoskeletal: Normal range of motion.  Cardiovascular:     Rate and Rhythm: Normal rate and regular rhythm.     Pulses: Normal pulses.     Comments: 2+ radial pulses, symmetric.  Pulmonary:     Effort: Pulmonary effort is  normal.     Breath sounds: Normal breath sounds.  Abdominal:     Palpations: Abdomen is soft.  Musculoskeletal:     Comments: Upper extremities: Small break in the skin to the mid left upper outer arm. There is an area of about 11 x 9 cm of erythema surrounding this that is warm to touch. No palpable fluctuance. Area of erythema is tender to palpation but no bony tenderness. Normal AROM to all joints of the upper extremities bilaterally.   Neurological:     Mental Status: She is alert.     Comments: Clear speech. Sensation grossly intact to bilateral upper extremities. 5/5  symmetric grip strength.   Psychiatric:        Behavior: Behavior normal.        Thought Content: Thought content normal.        ED Treatments / Results  Labs (all labs ordered are listed, but only abnormal results are displayed) Labs Reviewed - No data to display  EKG None  Radiology No results found.  Procedures Ultrasound ED Soft Tissue Date/Time: 04/07/2018 3:11 PM Performed by: Cherly Anderson, PA-C Authorized by: Cherly Anderson, PA-C   Procedure details:    Indications comment:  Evaluate for abscess vs. cellulitis   Images: not archived   Location:    Location: upper extremity     Side:  Left Findings:     no abscess present    no foreign body present   (including critical care time)    Medications Ordered in ED Medications - No data to display   Initial Impression / Assessment and Plan / ED Course  I have reviewed the triage vital signs and the nursing notes.  Pertinent labs & imaging results that were available during my care of the patient were reviewed by me and considered in my medical decision making (see chart for details).   Patient presents to the ED with area of erythema/warmth to the upper left outer arm with central area of break in the skin. Bedside US without abscess. Not urticarial appearing. Unclear initial etiology- possible insect bite with surrounding  reaction vs. Cellulitis. No evidence of anaphylaxis. Not urticarial.  Will cover with clindamycin per discussion with clinical pharmacist Sherilyn Cooter. Naproxen for pain. Benadryl for itching. I discussed treatment plan, need for follow-up, and return precautions with the patient. Provided opportunity for questions, patient confirmed understanding and is in agreement with plan.    Final Clinical Impressions(s) / ED Diagnoses   Final diagnoses:  Cellulitis of left upper extremity    ED Discharge Orders         Ordered    clindamycin (CLEOCIN) 150 MG capsule  Every 6 hours     04/07/18 1516    naproxen (NAPROSYN) 500 MG tablet  2 times daily     04/07/18 1516           Chigozie Basaldua, [place] R, PA-C 04/07/18 1519    Tegeler, Canary Brim, MD 04/07/18 1538

## 2018-04-07 NOTE — Discharge Instructions (Addendum)
You were seen in the ER for arm redness. We feel this may be cellulitis- an infection. We placed you on clindamycin for this.   Please take all of your antibiotics until finished. You may develop abdominal discomfort or diarrhea from the antibiotic.  You may help offset this with probiotics which you can buy at the store (ask your pharmacist if unable to find) or get probiotics in the form of eating yogurt. Do not eat or take the probiotics until 2 hours after your antibiotic. If you are unable to tolerate these side effects follow-up with your primary care provider or return to the emergency department.    We additionally are giving you a prescription for naproxen to help with pain.   - Naproxen is a nonsteroidal anti-inflammatory medication that will help with pain and swelling. Be sure to take this medication as prescribed with food, 1 pill every 12 hours,  It should be taken with food, as it can cause stomach upset, and more seriously, stomach bleeding. Do not take other nonsteroidal anti-inflammatory medications with this such as Advil, Motrin, Aleve, Mobic, Goodie Powder, or Motrin.    You make take Tylenol per over the counter dosing with these medications.   We have prescribed you new medication(s) today. Discuss the medications prescribed today with your pharmacist as they can have adverse effects and interactions with your other medicines including over the counter and prescribed medications. Seek medical evaluation if you start to experience new or abnormal symptoms after taking one of these medicines, seek care immediately if you start to experience difficulty breathing, feeling of your throat closing, facial swelling, or rash as these could be indications of a more serious allergic reaction  Please take benadryl per over the counter dosing for itching.   Follow up with primary care in 3-5 days.  Return to the ER for new or worsening symptoms including not limited to fever, spreading  redness, trouble breathing, swelling around your mouth, vomiting, or any other concerns.

## 2018-04-08 ENCOUNTER — Encounter (HOSPITAL_COMMUNITY): Payer: Self-pay | Admitting: Behavioral Health

## 2018-04-08 ENCOUNTER — Other Ambulatory Visit: Payer: Self-pay | Admitting: Registered Nurse

## 2018-04-08 ENCOUNTER — Other Ambulatory Visit: Payer: Self-pay

## 2018-04-08 ENCOUNTER — Emergency Department (HOSPITAL_COMMUNITY)
Admission: EM | Admit: 2018-04-08 | Discharge: 2018-04-09 | Disposition: A | Discharge status: Other Acute Inpatient Hospital | Payer: 59 | Attending: Emergency Medicine | Admitting: Emergency Medicine

## 2018-04-08 DIAGNOSIS — J45909 Unspecified asthma, uncomplicated: Secondary | ICD-10-CM | POA: Insufficient documentation

## 2018-04-08 DIAGNOSIS — I10 Essential (primary) hypertension: Secondary | ICD-10-CM | POA: Insufficient documentation

## 2018-04-08 DIAGNOSIS — F332 Major depressive disorder, recurrent severe without psychotic features: Secondary | ICD-10-CM | POA: Diagnosis not present

## 2018-04-08 DIAGNOSIS — Z79899 Other long term (current) drug therapy: Secondary | ICD-10-CM | POA: Insufficient documentation

## 2018-04-08 DIAGNOSIS — Z91040 Latex allergy status: Secondary | ICD-10-CM | POA: Diagnosis not present

## 2018-04-08 DIAGNOSIS — R45851 Suicidal ideations: Secondary | ICD-10-CM | POA: Insufficient documentation

## 2018-04-08 DIAGNOSIS — F419 Anxiety disorder, unspecified: Secondary | ICD-10-CM | POA: Insufficient documentation

## 2018-04-08 DIAGNOSIS — F329 Major depressive disorder, single episode, unspecified: Secondary | ICD-10-CM | POA: Diagnosis present

## 2018-04-08 HISTORY — DX: Family history of other specified conditions: Z84.89

## 2018-04-08 LAB — COMPREHENSIVE METABOLIC PANEL
ALT: 22 U/L (ref 0–44)
AST: 34 U/L (ref 15–41)
Albumin: 4 g/dL (ref 3.5–5.0)
Alkaline Phosphatase: 47 U/L (ref 38–126)
Anion gap: 8 (ref 5–15)
BUN: 10 mg/dL (ref 6–20)
CO2: 23 mmol/L (ref 22–32)
Calcium: 8.6 mg/dL — ABNORMAL LOW (ref 8.9–10.3)
Chloride: 106 mmol/L (ref 98–111)
Creatinine, Ser: 0.66 mg/dL (ref 0.44–1.00)
GFR calc Af Amer: >60 mL/min (ref >60)
GFR calc non Af Amer: >60 mL/min (ref >60)
Glucose, Bld: 127 mg/dL — ABNORMAL HIGH (ref 70–99)
Potassium: 3.7 mmol/L (ref 3.5–5.1)
Sodium: 137 mmol/L (ref 135–145)
Total Bilirubin: 0.5 mg/dL (ref 0.3–1.2)
Total Protein: 6.8 g/dL (ref 6.5–8.1)

## 2018-04-08 LAB — CBC WITH DIFFERENTIAL/PLATELET
Abs Immature Granulocytes: 0.01 10*3/uL (ref 0.00–0.07)
Basophils Absolute: 0 10*3/uL (ref 0.0–0.1)
Basophils Relative: 0 %
Eosinophils Absolute: 0.6 10*3/uL — ABNORMAL HIGH (ref 0.0–0.5)
Eosinophils Relative: 9 %
HCT: 39.9 % (ref 36.0–46.0)
Hemoglobin: 13.6 g/dL (ref 12.0–15.0)
Immature Granulocytes: 0 %
Lymphocytes Relative: 37 %
Lymphs Abs: 2.2 10*3/uL (ref 0.7–4.0)
MCH: 29.4 pg (ref 26.0–34.0)
MCHC: 34.1 g/dL (ref 30.0–36.0)
MCV: 86.2 fL (ref 80.0–100.0)
Monocytes Absolute: 0.3 10*3/uL (ref 0.1–1.0)
Monocytes Relative: 5 %
Neutro Abs: 2.8 10*3/uL (ref 1.7–7.7)
Neutrophils Relative %: 49 %
Platelets: 206 10*3/uL (ref 150–400)
RBC: 4.63 MIL/uL (ref 3.87–5.11)
RDW: 12.8 % (ref 11.5–15.5)
WBC: 5.9 10*3/uL (ref 4.0–10.5)
nRBC: 0 % (ref 0.0–0.2)

## 2018-04-08 LAB — RAPID URINE DRUG SCREEN, HOSP PERFORMED
Amphetamines: NOT DETECTED
Barbiturates: NOT DETECTED
Benzodiazepines: POSITIVE — AB
Cocaine: NOT DETECTED
Opiates: POSITIVE — AB
Tetrahydrocannabinol: POSITIVE — AB

## 2018-04-08 LAB — ACETAMINOPHEN LEVEL: Acetaminophen (Tylenol), Serum: <10 ug/mL — ABNORMAL LOW (ref 10–30)

## 2018-04-08 LAB — I-STAT BETA HCG BLOOD, ED (MC, WL, AP ONLY): I-stat hCG, quantitative: <5 m[IU]/mL (ref <5)

## 2018-04-08 LAB — ETHANOL: Alcohol, Ethyl (B): <10 mg/dL (ref <10)

## 2018-04-08 LAB — SALICYLATE LEVEL: Salicylate Lvl: <7 mg/dL (ref 2.8–30.0)

## 2018-04-08 MED ORDER — ALBUTEROL SULFATE HFA 108 (90 BASE) MCG/ACT IN AERS
2.0000 | INHALATION_SPRAY | RESPIRATORY_TRACT | Status: DC | PRN
  Administered 2018-04-08: 2 via RESPIRATORY_TRACT
  Filled 2018-04-08: qty 6.7

## 2018-04-08 MED ORDER — ALBUTEROL SULFATE HFA 108 (90 BASE) MCG/ACT IN AERS: 2.0000 | INHALATION_SPRAY | RESPIRATORY_TRACT | Status: DC | PRN

## 2018-04-08 MED ORDER — HYDROXYZINE HCL 25 MG PO TABS: 25.0000 mg | ORAL_TABLET | Freq: Four times a day (QID) | ORAL | Status: DC | PRN

## 2018-04-08 MED ORDER — ALBUTEROL SULFATE (2.5 MG/3ML) 0.083% IN NEBU: 2.5000 mg | INHALATION_SOLUTION | Freq: Once | RESPIRATORY_TRACT | Status: DC

## 2018-04-08 MED ORDER — TRAZODONE HCL 50 MG PO TABS
50.0000 mg | ORAL_TABLET | Freq: Every day | ORAL | Status: DC
  Administered 2018-04-08: 50 mg via ORAL
  Filled 2018-04-08: qty 1

## 2018-04-08 MED ORDER — FLUOXETINE HCL 20 MG PO CAPS
20.0000 mg | ORAL_CAPSULE | Freq: Every day | ORAL | Status: DC
  Administered 2018-04-08: 20 mg via ORAL
  Filled 2018-04-08: qty 1

## 2018-04-08 NOTE — ED Triage Notes (Signed)
Pt to room #40. Pt presents with sad affect. Endorsing SI with plan to OD. Pt reports increase in anger. Pt identifies current stressors as work and family. Pt reports "I'm in a bad place." Reports she feels overwhelmed. Encouragement and support provided.  Special checks q 15 mins in place for safety,  Video monitoring in place. Will continue to monitor.

## 2018-04-08 NOTE — ED Notes (Signed)
Bed: [facility]40 Expected date:  Expected time:  Means of arrival:  Comments: Hold for triage 3 

## 2018-04-08 NOTE — ED Notes (Signed)
TTS at bedside. 
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
## 2018-04-08 NOTE — ED Notes (Signed)
Bed: [facility] Expected date:  Expected time:  Means of arrival:  Comments: 

## 2018-04-08 NOTE — Progress Notes (Signed)
Pt accepted to [facility] 403-2. Attending provider will be Dr. Jola Babinski, MD. Call to report [phone]. Bed is available now. Joann, RN has been advised of the pt's acceptance.   Princess Bruins, MSW, LCSW Therapeutic Triage Specialist  [phone]

## 2018-04-08 NOTE — ED Notes (Signed)
Provider notified that pt is on unit.

## 2018-04-08 NOTE — ED Triage Notes (Signed)
Pt reports that she had a panic attack at work today. She states that that made her think more about hurting herself. No specific plan. She does not want her family to know anything about her visit.

## 2018-04-08 NOTE — ED Notes (Signed)
Provider at bedside

## 2018-04-08 NOTE — Progress Notes (Signed)
VOL consent for treatment signed and faxed to [facility] for review.   Princess Bruins, MSW, LCSW Therapeutic Triage Specialist  [phone]

## 2018-04-08 NOTE — Progress Notes (Signed)
Pt accepted to [facility]; room 403-2 Shuvon Rankin, NP is the accepting provider.   Dr. Jola Babinski is the attending provider.   Call report to [phone] [facility]  ED notified.    Pt is voluntary and can transported by [facility].    Pt is scheduled to arrive as soon as transportation can be arranged.   Wells Guiles, LCSW, LCAS Disposition CSW [facility]/TTS [phone]

## 2018-04-08 NOTE — BH Assessment (Signed)
Assessment Note  Tanya Herrera is an 33 y.o. female. Pt reports SI with a plan to overdose. Per Pt she's had SI for 6 months. Pt denies HI and AVH. Pt reports depressive symptoms. Per Pt she is depressed due to family and work issues. Pt denies current outpatient treatment and medication. Per Pt she was being seen by a psychiatrist "months ago" but stopped going. Pt denies previous hospitalizations. Pt denies SA.  Jacki Cones, NP recommends inpatient treatment.  Diagnosis:  F33.2 MDD  Past Medical History:  Past Medical History:  Diagnosis Date  . Asthma   . Family history of adverse reaction to anesthesia   . H/O transfusion of packed red blood cells   . Hemophilia (HCC)   . HHT (hereditary hemorrhagic telangiectasia) (HCC)   . Hypertension   . Insomnia   . Major depressive disorder   . Panic disorder   . PTSD (post-traumatic stress disorder)     Past Surgical History:  Procedure Laterality Date  . ABDOMINAL HYSTERECTOMY    . ABDOMINAL SURGERY    . CESAREAN SECTION     x 3  . DILATION AND CURETTAGE OF UTERUS    . ENDOMETRIAL ABLATION    . NOSE SURGERY    . TUBAL LIGATION      Family History: No family history on file.  Social History:  reports that she has never smoked. She has never used smokeless tobacco. She reports current alcohol use. She reports that she does not use drugs.  Additional Social History:  Alcohol / Drug Use Pain Medications: please see mar Prescriptions: please see mar Over the Counter: please see mar History of alcohol / drug use?: No history of alcohol / drug abuse Longest period of sobriety (when/how long): NA  CIWA: CIWA-Ar BP: 140/69 Pulse Rate: 72 COWS:    Allergies:  Allergies  Allergen Reactions  . Hydrocodone Hives  . Iron Shortness Of Breath  . Iron Dextran Cough, Other (See Comments) and Shortness Of Breath  . Latex Hives, Other (See Comments) and Rash  . Banana Other (See Comments)    MOUTH ITCH, IRRITATED FEELING  .  Penicillins Hives    Home Medications: (Not in a hospital admission)   OB/GYN Status:  Patient's last menstrual period was 10/19/2015.  General Assessment Data Location of Assessment: [facility] ED TTS Assessment: In system Is this a Tele or Face-to-Face Assessment?: Face-to-Face Is this an Initial Assessment or a Re-assessment for this encounter?: Initial Assessment Patient Accompanied by:: N/A Language Other than English: No Living Arrangements: Other (Comment) What gender do you identify as?: Female Marital status: Married Artist name: NA Pregnancy Status: No Living Arrangements: Parent, Children, Spouse/significant other Can pt return to current living arrangement?: Yes Admission Status: Voluntary Is patient capable of signing voluntary admission?: Yes Referral Source: Self/Family/Friend Insurance type: Community education officer     Crisis Care Plan Living Arrangements: Parent, Children, Spouse/significant other Legal Guardian: Other:(self) Name of Psychiatrist: NA Name of Therapist: NA     Risk to self with the past 6 months Suicidal Ideation: Yes-Currently Present Has patient been a risk to self within the past 6 months prior to admission? : Yes Suicidal Intent: Yes-Currently Present Has patient had any suicidal intent within the past 6 months prior to admission? : Yes Is patient at risk for suicide?: Yes Suicidal Plan?: Yes-Currently Present Has patient had any suicidal plan within the past 6 months prior to admission? : Yes Specify Current Suicidal Plan: to overdose Access to Means: Yes Specify Access to  Suicidal Means: access to pills What has been your use of drugs/alcohol within the last 12 months?: NA Previous Attempts/Gestures: No How many times?: 0 Other Self Harm Risks: NA Triggers for Past Attempts: None known Intentional Self Injurious Behavior: None Family Suicide History: No Recent stressful life event(s): Conflict (Comment) Persecutory voices/beliefs?: No Depression:  Yes Depression Symptoms: Tearfulness, Isolating, Fatigue, Insomnia, Loss of interest in usual pleasures, Feeling worthless/self pity, Feeling angry/irritable Substance abuse history and/or treatment for substance abuse?: No Suicide prevention information given to non-admitted patients: Not applicable  Risk to Others within the past 6 months Homicidal Ideation: No Does patient have any lifetime risk of violence toward others beyond the six months prior to admission? : No Thoughts of Harm to Others: No Current Homicidal Intent: No Current Homicidal Plan: No Access to Homicidal Means: No Identified Victim: NA History of harm to others?: No Assessment of Violence: None Noted Violent Behavior Description: NA Does patient have access to weapons?: No Criminal Charges Pending?: No Does patient have a court date: No Is patient on probation?: No  Psychosis Hallucinations: None noted Delusions: None noted  Mental Status Report Appearance/Hygiene: Unremarkable Eye Contact: Fair Motor Activity: Freedom of movement Speech: Logical/coherent Level of Consciousness: Alert Mood: Depressed Affect: Depressed Anxiety Level: Moderate Thought Processes: Coherent, Relevant Judgement: Impaired Orientation: Person, Time, Place, Situation, Appropriate for developmental age Obsessive Compulsive Thoughts/Behaviors: None  Cognitive Functioning Concentration: Normal Memory: Recent Intact, Remote Intact Is patient IDD: No Insight: Poor Impulse Control: Poor Appetite: Fair Have you had any weight changes? : No Change Sleep: Decreased Total Hours of Sleep: 7 Vegetative Symptoms: None  ADLScreening [facility] Assessment Services) Patient's cognitive ability adequate to safely complete daily activities?: Yes Patient able to express need for assistance with ADLs?: Yes Independently performs ADLs?: Yes (appropriate for developmental age)  Prior Inpatient Therapy Prior Inpatient Therapy: No  Prior  Outpatient Therapy Prior Outpatient Therapy: Yes Prior Therapy Dates: 2019 Prior Therapy Facilty/Provider(s): unknown Reason for Treatment: depression Does patient have an ACCT team?: No Does patient have Intensive In-House Services?  : No Does patient have [facility] services? : No Does patient have P4CC services?: No  ADL Screening (condition at time of admission) Patient's cognitive ability adequate to safely complete daily activities?: Yes Is the patient deaf or have difficulty hearing?: No Does the patient have difficulty seeing, even when wearing glasses/contacts?: No Does the patient have difficulty concentrating, remembering, or making decisions?: No Patient able to express need for assistance with ADLs?: Yes Does the patient have difficulty dressing or bathing?: No Independently performs ADLs?: Yes (appropriate for developmental age) Does the patient have difficulty walking or climbing stairs?: No       Abuse/Neglect Assessment (Assessment to be complete while patient is alone) Abuse/Neglect Assessment Can Be Completed: Yes Physical Abuse: Denies Verbal Abuse: Denies Sexual Abuse: Denies Exploitation of patient/patient's resources: Denies     Merchant navy officer (For Healthcare) Does Patient Have a Medical Advance Directive?: No Would patient like information on creating a medical advance directive?: No - Patient declined          Disposition:  Disposition Initial Assessment Completed for this Encounter: Yes  On Site Evaluation by:   Reviewed with Physician:    Emmit Pomfret 04/08/2018 5:42 PM

## 2018-04-08 NOTE — Progress Notes (Signed)
Received Tanya Herrera this PM in her room asleep. She woke up to use the bathroom, requested her inhaler, it was ordered and given ice water. She continued to endorsed feeling anxious and depressed with passive SI without a plan. She called her husband. Later she got a bed at [facility], report was called, transport and she signed the voluntary form. She was transported via [facility] without incident at 0015 hrs on 04/09/2018

## 2018-04-08 NOTE — ED Notes (Signed)
Phlebotomist called for lab draw.

## 2018-04-09 ENCOUNTER — Encounter (HOSPITAL_COMMUNITY): Payer: Self-pay | Admitting: *Deleted

## 2018-04-09 ENCOUNTER — Other Ambulatory Visit: Payer: Self-pay

## 2018-04-09 ENCOUNTER — Inpatient Hospital Stay (HOSPITAL_COMMUNITY)
Admission: AD | Admit: 2018-04-09 | Discharge: 2018-04-09 | DRG: 885 | Disposition: A | Discharge status: Home / Self Care | Payer: 59 | Source: Intra-hospital | Attending: Psychiatry | Admitting: Psychiatry

## 2018-04-09 DIAGNOSIS — F4310 Post-traumatic stress disorder, unspecified: Secondary | ICD-10-CM | POA: Diagnosis present

## 2018-04-09 DIAGNOSIS — Z91018 Allergy to other foods: Secondary | ICD-10-CM | POA: Diagnosis not present

## 2018-04-09 DIAGNOSIS — Z880 Allergy status to penicillin: Secondary | ICD-10-CM

## 2018-04-09 DIAGNOSIS — Z885 Allergy status to narcotic agent status: Secondary | ICD-10-CM

## 2018-04-09 DIAGNOSIS — D66 Hereditary factor VIII deficiency: Secondary | ICD-10-CM | POA: Diagnosis present

## 2018-04-09 DIAGNOSIS — Z79899 Other long term (current) drug therapy: Secondary | ICD-10-CM | POA: Diagnosis not present

## 2018-04-09 DIAGNOSIS — I780 Hereditary hemorrhagic telangiectasia: Secondary | ICD-10-CM | POA: Diagnosis present

## 2018-04-09 DIAGNOSIS — F332 Major depressive disorder, recurrent severe without psychotic features: Secondary | ICD-10-CM | POA: Diagnosis not present

## 2018-04-09 DIAGNOSIS — R45851 Suicidal ideations: Secondary | ICD-10-CM | POA: Diagnosis present

## 2018-04-09 DIAGNOSIS — Z90710 Acquired absence of both cervix and uterus: Secondary | ICD-10-CM | POA: Diagnosis not present

## 2018-04-09 DIAGNOSIS — Z888 Allergy status to other drugs, medicaments and biological substances status: Secondary | ICD-10-CM

## 2018-04-09 DIAGNOSIS — Z818 Family history of other mental and behavioral disorders: Secondary | ICD-10-CM

## 2018-04-09 DIAGNOSIS — F322 Major depressive disorder, single episode, severe without psychotic features: Principal | ICD-10-CM | POA: Diagnosis present

## 2018-04-09 DIAGNOSIS — I10 Essential (primary) hypertension: Secondary | ICD-10-CM | POA: Diagnosis present

## 2018-04-09 DIAGNOSIS — G4700 Insomnia, unspecified: Secondary | ICD-10-CM | POA: Diagnosis present

## 2018-04-09 DIAGNOSIS — J45909 Unspecified asthma, uncomplicated: Secondary | ICD-10-CM | POA: Diagnosis present

## 2018-04-09 DIAGNOSIS — F410 Panic disorder [episodic paroxysmal anxiety] without agoraphobia: Secondary | ICD-10-CM | POA: Diagnosis present

## 2018-04-09 DIAGNOSIS — Z91040 Latex allergy status: Secondary | ICD-10-CM

## 2018-04-09 MED ORDER — LOSARTAN POTASSIUM 25 MG PO TABS
25.0000 mg | ORAL_TABLET | Freq: Every day | ORAL | Status: DC
  Administered 2018-04-09: 25 mg via ORAL
  Filled 2018-04-09 (×3): qty 1

## 2018-04-09 MED ORDER — TRAZODONE HCL 50 MG PO TABS: 50.0000 mg | ORAL_TABLET | Freq: Every evening | ORAL | Status: DC | PRN

## 2018-04-09 MED ORDER — ALBUTEROL SULFATE HFA 108 (90 BASE) MCG/ACT IN AERS
1.0000 | INHALATION_SPRAY | RESPIRATORY_TRACT | Status: DC | PRN
  Administered 2018-04-09: 2 via RESPIRATORY_TRACT

## 2018-04-09 MED ORDER — FLUOXETINE HCL 20 MG PO CAPS: 40.0000 mg | ORAL_CAPSULE | Freq: Two times a day (BID) | ORAL | Status: DC

## 2018-04-09 MED ORDER — FLUOXETINE HCL 20 MG PO CAPS
40.0000 mg | ORAL_CAPSULE | Freq: Once | ORAL | Status: AC
  Administered 2018-04-09: 40 mg via ORAL
  Filled 2018-04-09: qty 2

## 2018-04-09 MED ORDER — HYDROXYZINE HCL 25 MG PO TABS: 25.0000 mg | ORAL_TABLET | Freq: Three times a day (TID) | ORAL | Status: DC | PRN

## 2018-04-09 MED ORDER — CLONAZEPAM 0.5 MG PO TABS: 0.5000 mg | ORAL_TABLET | Freq: Two times a day (BID) | ORAL | Status: DC | PRN

## 2018-04-09 MED ORDER — FLUOXETINE HCL 20 MG PO CAPS
40.0000 mg | ORAL_CAPSULE | Freq: Every day | ORAL | Status: DC
  Filled 2018-04-09 (×2): qty 2

## 2018-04-09 NOTE — Tx Team (Addendum)
Initial Treatment Plan 04/09/2018 1:21 AM Tanya Herrera ZGY:[id number]    PATIENT STRESSORS: Occupational concerns Other: "home life, work"    PATIENT STRENGTHS: Ability for insight Average or above average intelligence Capable of independent living Wellsite geologist fund of knowledge Work skills   PATIENT IDENTIFIED PROBLEMS: "focus on getting home to my kids"   "taking care of myself"     SI  depression             DISCHARGE CRITERIA:  Improved stabilization in mood, thinking, and/or behavior Reduction of life-threatening or endangering symptoms to within safe limits  PRELIMINARY DISCHARGE PLAN: Outpatient therapy  PATIENT/FAMILY INVOLVEMENT: This treatment plan has been presented to and reviewed with the patient, Tanya Herrera.  The patient and family have been given the opportunity to ask questions and make suggestions.  Arrie Aran, [place] 04/09/2018, 1:21 AM

## 2018-04-09 NOTE — [facility] Suicide Risk Assessment (Signed)
[facility] INPATIENT:  Family/Significant Other Suicide Prevention Education  Suicide Prevention Education:  Education Completed; husband, Tanya Herrera [phone]) has been identified by the patient as the family member/significant other with whom the patient will be residing, and identified as the person(s) who will aid the patient in the event of a mental health crisis (suicidal ideations/suicide attempt).  With written consent from the patient, the family member/significant other has been provided the following suicide prevention education, prior to the and/or following the discharge of the patient.  The suicide prevention education provided includes the following:  Suicide risk factors  Suicide prevention and interventions  National Suicide Hotline telephone number  [facility] assessment telephone number  [facility] Emergency Assistance 911  [facility] and/or Residential Mobile Crisis Unit telephone number  Request made of family/significant other to:  Remove weapons (e.g., guns, rifles, knives), all items previously/currently identified as safety concern.    Remove drugs/medications (over-the-counter, prescriptions, illicit drugs), all items previously/currently identified as a safety concern.  The family member/significant other verbalizes understanding of the suicide prevention education information provided.  The family member/significant other agrees to remove the items of safety concern listed above.  Husband, Tanya Herrera shares patient has experienced multiple stressors lately and explains that this time of year is particularly stressful for the patient at work ("tax time"). Tanya Herrera shares that is has been a while since the patient followed up with her psychiatrist and hopes she can meet with them again soon.  Tanya Herrera has no safety concerns, there are no guns in the home. Tanya Herrera says he has spoken with the patient earlier this morning, she sounded well, and he hopes she can  come home today.  Tanya Herrera 04/09/2018, 12:38 PM

## 2018-04-09 NOTE — Progress Notes (Signed)
Pt is a 33 year old female admitted to [facility] voluntarily for depression and SI.  She reports she is here because "I kind of had a breakdown moment.  I'm under a lot of stress lately."  Pt identifies stressors as "home life, work."  She reports she was feeling suicidal without a plan prior to admit.  She presents with depressed affect and mood.  Eye contact is fair and speech is logical.  Pt denies SI/HI, hallucinations, and pain during admission assessment.  She has medical history of depression, PTSD, anxiety, hemophilia, HTN, asthma.  Denies history of abuse.  Reports she drinks "one or two" drinks rarely and drinks in social setting.  Denies drug use, denies tobacco use.  Identifies husband and father as support system.    Introduced self to pt.  Actively listened to pt and provided support and encouragement.  Admission process and paperwork completed with pt.  Non-invasive body assessment completed by female Charity fundraiser.  Pt has tattoos; erythema to L upper arm.  Belongings searched for contraband and items not allowed on unit are in locker 13.  Fall prevention techniques reviewed with pt and she verbalized understanding.  Pt oriented to unit and routine.  Meal and PO fluids offered. EKG performed by another RN.  Q15 minute safety checks in place.    Pt is calm and cooperative with admission process.  She verbally contracts for safety and reports she will inform staff of needs and concerns.  Pt is safe on the unit.  Will continue to monitor and assess.
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
## 2018-04-09 NOTE — Plan of Care (Signed)
  Problem: Education: Goal: Knowledge of [facility] Education information/materials will improve Outcome: Progressing   

## 2018-04-09 NOTE — Tx Team (Signed)
Interdisciplinary Treatment and Diagnostic Plan Update  04/09/2018 Time of Session: 9:20am Tanya Herrera XXXGainey MRN: [id number]  Principal Diagnosis: <principal problem not specified>  Secondary Diagnoses: Active Problems:   MDD (major depressive disorder), severe (HCC)   Current Medications:  Current Facility-Administered Medications  Medication Dose Route Frequency Provider Last Rate Last Dose  . albuterol (PROVENTIL HFA;VENTOLIN HFA) 108 (90 Base) MCG/ACT inhaler 1-2 puff  1-2 puff Inhalation Q4H PRN Antonieta Pertlary, Greg Lawson, MD      . clonazePAM Scarlette Calico(KLONOPIN) tablet 0.5 mg  0.5 mg Oral BID PRN Antonieta Pertlary, Greg Lawson, MD      . FLUoxetine (PROZAC) capsule 40 mg  40 mg Oral BID Antonieta Pertlary, Greg Lawson, MD      . hydrOXYzine (ATARAX/VISTARIL) tablet 25 mg  25 mg Oral TID PRN Antonieta Pertlary, Greg Lawson, MD      . losartan (COZAAR) tablet 25 mg  25 mg Oral Daily Antonieta Pertlary, Greg Lawson, MD      . traZODone (DESYREL) tablet 50 mg  50 mg Oral QHS PRN Antonieta Pertlary, Greg Lawson, MD       PTA Medications: Medications Prior to Admission  Medication Sig Dispense Refill Last Dose  . albuterol (PROVENTIL HFA;VENTOLIN HFA) 108 (90 Base) MCG/ACT inhaler Inhale 2 puffs into the lungs every 4 (four) hours as needed for wheezing or shortness of breath. 1 Inhaler 0 04/08/2018 at Unknown time  . albuterol (PROVENTIL) (2.5 MG/3ML) 0.083% nebulizer solution Take 3 mLs (2.5 mg total) by nebulization every 4 (four) hours as needed for wheezing or shortness of breath. 75 mL 12 unknown  . clindamycin (CLEOCIN) 150 MG capsule Take 2 capsules (300 mg total) by mouth every 6 (six) hours. 42 capsule 0 04/08/2018 at Unknown time  . clonazePAM (KLONOPIN) 1 MG tablet Take 1 mg by mouth at bedtime as needed for anxiety.   0 04/07/2018 at Unknown time  . FLUoxetine (PROZAC) 40 MG capsule Take 80 mg by mouth daily.    04/08/2018 at Unknown time  . HYDROcodone-acetaminophen (NORCO) 5-325 MG tablet Take 1 tablet by mouth every 6 (six) hours as needed for moderate  pain. (Patient not taking: Reported on 04/08/2018) 10 tablet 0 Completed Course at Unknown time  . levocetirizine (XYZAL) 5 MG tablet Take 1 tablet (5 mg total) by mouth every evening. (Patient not taking: Reported on 04/08/2018) 30 tablet 0 Completed Course at Unknown time  . meloxicam (MOBIC) 15 MG tablet Take 1 tablet (15 mg total) by mouth daily. (Patient not taking: Reported on 04/08/2018) 30 tablet 2 Completed Course at Unknown time  . naproxen (NAPROSYN) 500 MG tablet Take 1 tablet (500 mg total) by mouth 2 (two) times daily. 10 tablet 0 04/07/2018 at Unknown time  . ondansetron (ZOFRAN ODT) 4 MG disintegrating tablet Take 1 tablet (4 mg total) by mouth every 8 (eight) hours as needed for nausea or vomiting. (Patient not taking: Reported on 04/08/2018) 10 tablet 0 Completed Course at Unknown time  . predniSONE (DELTASONE) 20 MG tablet 2 tabs po daily x 4 days (Patient not taking: Reported on 04/08/2018) 8 tablet 0 Not Taking at Unknown time  . traZODone (DESYREL) 50 MG tablet Take 100 mg by mouth at bedtime.    04/07/2018 at Unknown time    Patient Stressors: Occupational concerns Other: "home life, work"   Patient Strengths: Ability for insight Average or above average intelligence Capable of independent living Wellsite geologistCommunication skills General fund of knowledge Work skills  Treatment Modalities: Medication Management, Group therapy, Case management,  1 to 1  session with clinician, Psychoeducation, Recreational therapy.   Physician Treatment Plan for Primary Diagnosis: <principal problem not specified> Long Term Goal(s):     Short Term Goals:    Medication Management: Evaluate patient's response, side effects, and tolerance of medication regimen.  Therapeutic Interventions: 1 to 1 sessions, Unit Group sessions and Medication administration.  Evaluation of Outcomes: Progressing  Physician Treatment Plan for Secondary Diagnosis: Active Problems:   MDD (major depressive disorder), severe  (HCC)  Long Term Goal(s):     Short Term Goals:       Medication Management: Evaluate patient's response, side effects, and tolerance of medication regimen.  Therapeutic Interventions: 1 to 1 sessions, Unit Group sessions and Medication administration.  Evaluation of Outcomes: Progressing   RN Treatment Plan for Primary Diagnosis: <principal problem not specified> Long Term Goal(s): Knowledge of disease and therapeutic regimen to maintain health will improve  Short Term Goals: Ability to participate in decision making will improve, Ability to verbalize feelings will improve, Ability to disclose and discuss suicidal ideas and Ability to identify and develop effective coping behaviors will improve  Medication Management: RN will administer medications as ordered by provider, will assess and evaluate patient's response and provide education to patient for prescribed medication. RN will report any adverse and/or side effects to prescribing provider.  Therapeutic Interventions: 1 on 1 counseling sessions, Psychoeducation, Medication administration, Evaluate responses to treatment, Monitor vital signs and CBGs as ordered, Perform/monitor CIWA, COWS, AIMS and Fall Risk screenings as ordered, Perform wound care treatments as ordered.  Evaluation of Outcomes: Progressing   LCSW Treatment Plan for Primary Diagnosis: <principal problem not specified> Long Term Goal(s): Safe transition to appropriate next level of care at discharge, Engage patient in therapeutic group addressing interpersonal concerns.  Short Term Goals: Engage patient in aftercare planning with referrals and resources  Therapeutic Interventions: Assess for all discharge needs, 1 to 1 time with Social worker, Explore available resources and support systems, Assess for adequacy in community support network, Educate family and significant other(s) on suicide prevention, Complete Psychosocial Assessment, Interpersonal group  therapy.  Evaluation of Outcomes: Progressing   Progress in Treatment: Attending groups: No. New to unit  Participating in groups: No. Taking medication as prescribed: Yes. Toleration medication: Yes. Family/Significant other contact made: No, will contact:  the patient's husband Patient understands diagnosis: Yes. Discussing patient identified problems/goals with staff: Yes. Medical problems stabilized or resolved: Yes. Denies suicidal/homicidal ideation: Yes. Issues/concerns per patient self-inventory: No. Other:   New problem(s) identified: None   New Short Term/Long Term Goal(s): medication stabilization, elimination of SI thoughts, development of comprehensive mental wellness plan.    Patient Goals:  "To reduce stress and learn how to not let things get to me"   Discharge Plan or Barriers: Patient plans to discharge home with her spouse and children. She requested to be [place] [facility] [place]. CSW will continue to follow and assess for appropriate referrals and possible discharge planning.   Reason for Continuation of Hospitalization: Anxiety Depression Medication stabilization  Estimated Length of Stay: 04/10/2018  Attendees: Patient: Tanya Herrera  04/09/2018 11:33 AM  Physician: Dr/. Landry MellowGreg Clary, MD 04/09/2018 11:33 AM  Nursing: Alexia FreestonePatty. D, RN 04/09/2018 11:33 AM  RN Care Manager: 04/09/2018 11:33 AM  Social Worker: Baldo DaubJolan Ladasia Sircy, LCSWA 04/09/2018 11:33 AM  Recreational Therapist:  04/09/2018 11:33 AM  Other: Marciano SequinJanet Sykes, NP  04/09/2018 11:33 AM  Other:  04/09/2018 11:33 AM  Other: 04/09/2018 11:33 AM  Scribe for Treatment Team: Mariah Milling 04/09/2018 11:33 AM

## 2018-04-09 NOTE — Progress Notes (Signed)
Recreation Therapy Notes  Date: 1.17.20 Time: 0930 Location: [address] Dayroom  Group Topic: Stress Management  Goal Area(s) Addresses:  Patient will identify stress management techniques. Patient will identify benefit of using stress management post d/c.  Intervention:  Stress Management  Activity :  Progressive Muscle Relaxation.  LRT introduced the stress management technique of progressive muscle relaxation.  LRT lead the group in tensing and relaxing each muscle individually.  Patients were to follow along as scrip was read to engage in activity.  Education:  Stress Management, Discharge Planning.   Education Outcome: Acknowledges Education  Clinical Observations/Feedback: Pt did not attend group.     Caroll Rancher, LRT/CTRS         Caroll Rancher A 04/09/2018 11:39 AM
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
## 2018-04-09 NOTE — [facility] Suicide Risk Assessment (Signed)
[facility]   Principal Problem: <principal problem not specified> Discharge Diagnoses: Active Problems:   MDD (major depressive disorder), severe (HCC)   Total Time spent with patient: 45 minutes  Musculoskeletal: Strength & Muscle Tone: within normal limits Gait & Station: normal Patient leans: N/A  Psychiatric Specialty Exam: Review of Systems  All other systems reviewed and are negative.   Blood pressure (!) 142/102, pulse 92, temperature 97.9 F (36.6 C), temperature source Oral, resp. rate 16, height 5\' 6"  (1.676 m), weight 92.1 kg, last menstrual period 10/19/2015.Body mass index is 32.77 kg/m.  General Appearance: Casual  Eye Contact::  Good  Speech:  Normal Rate409  Volume:  Normal  Mood:  Anxious  Affect:  Congruent  Thought Process:  Coherent and Descriptions of Associations: Intact  Orientation:  Full (Time, Place, and Person)  Thought Content:  Logical  Suicidal Thoughts:  No  Homicidal Thoughts:  No  Memory:  Immediate;   Fair Recent;   Fair Remote;   Fair  Judgement:  Intact  Insight:  Fair  Psychomotor Activity:  Normal  Concentration:  Fair  Recall:  [employer]  Fund of Knowledge:Good  Language: Good  Akathisia:  Negative  Handed:  Right  AIMS (if indicated):     Assets:  Communication Skills Desire for Improvement Financial Resources/Insurance Housing Intimacy Physical Health Resilience Social Support  Sleep:  Number of Hours: 3.5  Cognition: WNL  ADL's:  Intact   Mental Status Per Nursing Assessment::   On Admission:  NA  Demographic Factors:  NA  Loss Factors: Financial problems/change in socioeconomic status  Historical Factors: Impulsivity  Risk Reduction Factors:   Responsible for children under 32 years of age, Sense of responsibility to family, Employed, Living with another person, especially a relative, Positive social support and Positive coping skills or problem solving skills  Continued Clinical  Symptoms:  Depression:   Impulsivity  Cognitive Features That Contribute To Risk:  None    Suicide Risk:  Minimal: No identifiable suicidal ideation.  Patients presenting with no risk factors but with morbid ruminations; may be classified as minimal risk based on the severity of the depressive symptoms  Follow-up Information    Barbette MerinoMcDonald, Carolyn D Follow up.   Why:  Medication management appointment  Contact information: [address] [place] [address] [phone]           Plan Of Care/Follow-up recommendations:  Activity:  ad lib  Antonieta PertGreg Lawson Katerra Ingman, MD 04/09/2018, 1:59 PM

## 2018-04-25 ENCOUNTER — Emergency Department (HOSPITAL_BASED_OUTPATIENT_CLINIC_OR_DEPARTMENT_OTHER): Payer: 59

## 2018-04-25 ENCOUNTER — Emergency Department (HOSPITAL_BASED_OUTPATIENT_CLINIC_OR_DEPARTMENT_OTHER)
Admission: EM | Admit: 2018-04-25 | Discharge: 2018-04-26 | Disposition: A | Discharge status: Home / Self Care | Payer: 59 | Attending: Emergency Medicine | Admitting: Emergency Medicine

## 2018-04-25 ENCOUNTER — Encounter (HOSPITAL_BASED_OUTPATIENT_CLINIC_OR_DEPARTMENT_OTHER): Payer: Self-pay | Admitting: Emergency Medicine

## 2018-04-25 ENCOUNTER — Other Ambulatory Visit: Payer: Self-pay

## 2018-04-25 DIAGNOSIS — Z91040 Latex allergy status: Secondary | ICD-10-CM | POA: Insufficient documentation

## 2018-04-25 DIAGNOSIS — F329 Major depressive disorder, single episode, unspecified: Secondary | ICD-10-CM | POA: Insufficient documentation

## 2018-04-25 DIAGNOSIS — Z79899 Other long term (current) drug therapy: Secondary | ICD-10-CM | POA: Insufficient documentation

## 2018-04-25 DIAGNOSIS — J45909 Unspecified asthma, uncomplicated: Secondary | ICD-10-CM | POA: Insufficient documentation

## 2018-04-25 DIAGNOSIS — J209 Acute bronchitis, unspecified: Secondary | ICD-10-CM | POA: Insufficient documentation

## 2018-04-25 DIAGNOSIS — I10 Essential (primary) hypertension: Secondary | ICD-10-CM | POA: Insufficient documentation

## 2018-04-25 MED ORDER — ALBUTEROL SULFATE (2.5 MG/3ML) 0.083% IN NEBU
INHALATION_SOLUTION | RESPIRATORY_TRACT | Status: AC
  Administered 2018-04-25: 2.5 mg
  Filled 2018-04-25: qty 3

## 2018-04-25 MED ORDER — IPRATROPIUM-ALBUTEROL 0.5-2.5 (3) MG/3ML IN SOLN
RESPIRATORY_TRACT | Status: AC
  Administered 2018-04-25: 3 mL
  Filled 2018-04-25: qty 3

## 2018-04-25 NOTE — ED Notes (Signed)
Patient is eating in the waiting room  - what appears to be eggs and bacon

## 2018-04-25 NOTE — ED Triage Notes (Signed)
Patient states that she has had a cough for the last week  - the patient reports that it is making her asthma flair  - patient has inhaler about an hour ago and nebulizer  - patient states that her chest is tight and back is hurting with the pain

## 2018-04-26 MED ORDER — HYDROCOD POLST-CPM POLST ER 10-8 MG/5ML PO SUER: 5.0000 mL | Freq: Two times a day (BID) | ORAL | 0 refills | Status: DC | PRN

## 2018-04-26 MED ORDER — AEROCHAMBER PLUS FLO-VU MISC
1.0000 | Freq: Once | Status: DC
  Filled 2018-04-26: qty 1

## 2018-04-26 MED ORDER — HYDROCOD POLST-CPM POLST ER 10-8 MG/5ML PO SUER
5.0000 mL | Freq: Once | ORAL | Status: AC
  Administered 2018-04-26: 5 mL via ORAL
  Filled 2018-04-26: qty 5

## 2018-04-26 MED FILL — HYDROCODONE-CHLORPHEN ER SU: 10-8 | 7 days supply | Qty: 70 | Fill #0

## 2018-04-26 NOTE — ED Provider Notes (Addendum)
[facility] Provider Note: Tanya DellJ. Lane Savan Ruta, MD, FACEP  CSN: [id number] MRN: [id number] ARRIVAL: 04/25/18 at 2230 ROOM: MH02/MH02   CHIEF COMPLAINT  Cough   HISTORY OF PRESENT ILLNESS  04/26/18 12:08 AM Tanya Herrera is a 33 y.o. female with a history of asthma.  She is here with a one-week history of cough productive of clear sputum, wheezing and chest soreness with cough or breathing.  She denies fever, nasal congestion, nausea or vomiting.  She has had diarrhea.  She has been using her home inhaler and neb machine without adequate relief.  She was given 2 neb treatments here with improvement.   Past Medical History:  Diagnosis Date  . Asthma   . Family history of adverse reaction to anesthesia   . H/O transfusion of packed red blood cells   . Hemophilia (HCC)   . HHT (hereditary hemorrhagic telangiectasia) (HCC)   . Hypertension   . Insomnia   . Major depressive disorder   . Panic disorder   . PTSD (post-traumatic stress disorder)     Past Surgical History:  Procedure Laterality Date  . ABDOMINAL HYSTERECTOMY    . ABDOMINAL SURGERY    . CESAREAN SECTION     x 3  . DILATION AND CURETTAGE OF UTERUS    . ENDOMETRIAL ABLATION    . NOSE SURGERY    . TUBAL LIGATION      History reviewed. No pertinent family history.  Social History   Tobacco Use  . Smoking status: Never Smoker  . Smokeless tobacco: Never Used  Substance Use Topics  . Alcohol use: Not Currently    Comment: occassionally  . Drug use: No    Prior to Admission medications   Medication Sig Start Date End Date Taking? Authorizing Provider  albuterol (PROVENTIL HFA;VENTOLIN HFA) 108 (90 Base) MCG/ACT inhaler Inhale 2 puffs into the lungs every 4 (four) hours as needed for wheezing or shortness of breath. 04/19/17   Doug SouJacubowitz, Sam, MD  albuterol (PROVENTIL) (2.5 MG/3ML) 0.083% nebulizer solution Take 3 mLs (2.5 mg total) by nebulization every 4 (four) hours as needed for wheezing or  shortness of breath. 04/19/17   Doug SouJacubowitz, Sam, MD  clonazePAM (KLONOPIN) 1 MG tablet Take 1 mg by mouth at bedtime as needed for anxiety.  01/04/18   [provider]  FLUoxetine (PROZAC) 40 MG capsule Take 80 mg by mouth daily.  03/23/18   [provider]  traZODone (DESYREL) 50 MG tablet Take 100 mg by mouth at bedtime.  03/25/18   [provider]    Allergies Iron; Iron dextran; Latex; Banana; and Penicillins   REVIEW OF SYSTEMS  Negative except as noted here or in the History of Present Illness.   PHYSICAL EXAMINATION  Initial Vital Signs Blood pressure (!) 122/57, pulse (!) 117, temperature (!) 97.4 F (36.3 C), temperature source Oral, resp. rate 20, height 5\' 6"  (1.676 m), weight 92.1 kg, last menstrual period 10/19/2015, SpO2 97 %.  Examination General: Well-developed, well-nourished female in no acute distress; appearance consistent with age of record HENT: normocephalic; atraumatic Eyes: pupils equal, round and reactive to light; extraocular muscles intact Neck: supple Heart: regular rate and rhythm Lungs: clear to auscultation bilaterally Abdomen: soft; nondistended; nontender; bowel sounds present Back: Right upper soft tissue tenderness Extremities: No deformity; full range of motion; pulses normal Neurologic: Awake, alert and oriented; motor function intact in all extremities and symmetric; no facial droop Skin: Warm and dry Psychiatric: Normal mood and affect  RESULTS  Summary of this visit's results, reviewed by myself:   EKG Interpretation  Date/Time:    Ventricular Rate:    PR Interval:    QRS Duration:   QT Interval:    QTC Calculation:   R Axis:     Text Interpretation:        Laboratory Studies: No results found for this or any previous visit (from the past 24 hour(s)). Imaging Studies: Dg Chest 2 View  Result Date: 04/26/2018 CLINICAL DATA:  Cough for 1 month. EXAM: CHEST - 2 VIEW COMPARISON:  01/27/2018 FINDINGS:  The heart size and mediastinal contours are within normal limits. Both lungs are clear. The visualized skeletal structures are unremarkable. IMPRESSION: No active cardiopulmonary disease. Electronically Signed   By: Elige Ko   On: 04/26/2018 00:07    ED COURSE and MDM  Nursing notes and initial vitals signs, including pulse oximetry, reviewed.  Vitals:   04/25/18 2237 04/25/18 2239 04/25/18 2308 04/25/18 2338  BP:    (!) 122/57  Pulse:    (!) 117  Resp:    20  Temp:    (!) 97.4 F (36.3 C)  TempSrc:    Oral  SpO2:   98% 97%  Weight: 92.1 kg     Height: 5\' 6"  (1.676 m) 5\' 6"  (1.676 m)     The patient has been afebrile and chest x-ray is negative for infiltrate.  I do not believe antibiotics are indicated at this time.  We will get her an AeroChamber for her inhaler and provide her with Tussionex.   PROCEDURES    ED DIAGNOSES     ICD-10-CM   1. Acute bronchitis with bronchospasm J20.9        Addelynn Batte, MD 04/26/18 0023    Paula Libra, MD 04/26/18 [facility] Manners

## 2018-04-26 NOTE — ED Notes (Signed)
ED Provider at bedside. 

## 2018-04-26 NOTE — ED Notes (Signed)
Pt directed to pick up Rx at pharmacy listed on d/c paperwork. Pt called her husband for ride

## 2018-05-29 ENCOUNTER — Emergency Department (HOSPITAL_BASED_OUTPATIENT_CLINIC_OR_DEPARTMENT_OTHER): Payer: 59

## 2018-05-29 ENCOUNTER — Encounter (HOSPITAL_BASED_OUTPATIENT_CLINIC_OR_DEPARTMENT_OTHER): Payer: Self-pay | Admitting: *Deleted

## 2018-05-29 ENCOUNTER — Other Ambulatory Visit: Payer: Self-pay

## 2018-05-29 ENCOUNTER — Emergency Department (HOSPITAL_BASED_OUTPATIENT_CLINIC_OR_DEPARTMENT_OTHER)
Admission: EM | Admit: 2018-05-29 | Discharge: 2018-05-29 | Disposition: A | Discharge status: Home / Self Care | Payer: 59 | Attending: Emergency Medicine | Admitting: Emergency Medicine

## 2018-05-29 DIAGNOSIS — Z91040 Latex allergy status: Secondary | ICD-10-CM | POA: Insufficient documentation

## 2018-05-29 DIAGNOSIS — Z79899 Other long term (current) drug therapy: Secondary | ICD-10-CM | POA: Insufficient documentation

## 2018-05-29 DIAGNOSIS — I10 Essential (primary) hypertension: Secondary | ICD-10-CM | POA: Insufficient documentation

## 2018-05-29 DIAGNOSIS — R05 Cough: Secondary | ICD-10-CM | POA: Insufficient documentation

## 2018-05-29 DIAGNOSIS — R509 Fever, unspecified: Secondary | ICD-10-CM | POA: Insufficient documentation

## 2018-05-29 DIAGNOSIS — M791 Myalgia, unspecified site: Secondary | ICD-10-CM | POA: Insufficient documentation

## 2018-05-29 DIAGNOSIS — R070 Pain in throat: Secondary | ICD-10-CM | POA: Insufficient documentation

## 2018-05-29 DIAGNOSIS — R6889 Other general symptoms and signs: Secondary | ICD-10-CM

## 2018-05-29 DIAGNOSIS — J45909 Unspecified asthma, uncomplicated: Secondary | ICD-10-CM | POA: Insufficient documentation

## 2018-05-29 DIAGNOSIS — R0602 Shortness of breath: Secondary | ICD-10-CM | POA: Insufficient documentation

## 2018-05-29 MED ORDER — OSELTAMIVIR PHOSPHATE 75 MG PO CAPS: 75.0000 mg | ORAL_CAPSULE | Freq: Two times a day (BID) | ORAL | 0 refills | Status: DC

## 2018-05-29 MED ORDER — OSELTAMIVIR PHOSPHATE 75 MG PO CAPS
75.0000 mg | ORAL_CAPSULE | Freq: Once | ORAL | Status: AC
  Administered 2018-05-29: 75 mg via ORAL
  Filled 2018-05-29: qty 1

## 2018-05-29 MED ORDER — ACETAMINOPHEN 325 MG PO TABS
650.0000 mg | ORAL_TABLET | Freq: Once | ORAL | Status: AC
  Administered 2018-05-29: 650 mg via ORAL
  Filled 2018-05-29: qty 2

## 2018-05-29 NOTE — Discharge Instructions (Addendum)
Symptoms consistent with a flulike illness no evidence of pneumonia.  Take the Tamiflu as directed.  Also take Motrin Advil Aleve or Naprosyn for the body aches.  That will also help with any fever.  Take Mucinex DM for the cough.  Return for any new or worse symptoms.

## 2018-05-29 NOTE — ED Triage Notes (Signed)
Pt has had a fever of 103 and coughing and SOB X 2 days

## 2018-05-29 NOTE — ED Notes (Signed)
ED Provider at bedside. 

## 2018-05-29 NOTE — ED Provider Notes (Signed)
[facility] EMERGENCY DEPARTMENT Provider Note   CSN: [id number] Arrival date & time: 05/29/18  1900    History   Chief Complaint Chief Complaint  Patient presents with  . Shortness of Breath    HPI Tanya Herrera is a 33 y.o. female.     Patient with 2-day history of fever body aches cough and shortness of breath.  Temp at home was 103 upon arrival here 101.2.  Patient was concerned that maybe she had pneumonia.  But family members were concerned that maybe she had flulike illness.  No nausea vomiting or diarrhea.  No unusual travel.  No history of any coronavirus exposure.     Past Medical History:  Diagnosis Date  . Asthma   . Family history of adverse reaction to anesthesia   . H/O transfusion of packed red blood cells   . Hemophilia (HCC)   . HHT (hereditary hemorrhagic telangiectasia) (HCC)   . Hypertension   . Insomnia   . Major depressive disorder   . Panic disorder   . PTSD (post-traumatic stress disorder)     Patient Active Problem List   Diagnosis Date Noted  . MDD (major depressive disorder), severe (HCC) 04/09/2018  . Asthma 01/25/2015  . Gastro-esophageal reflux disease without esophagitis 01/25/2015  . Obesity with body mass index 30 or greater 08/04/2014  . Allergic rhinitis due to pollen 06/23/2014  . Anxiety 07/27/2013  . Essential (primary) hypertension 07/27/2013  . Anemia 06/28/2013  . Major depressive disorder, single episode, unspecified 11/26/2011  . Migraine without aura 07/16/2011    Past Surgical History:  Procedure Laterality Date  . ABDOMINAL HYSTERECTOMY    . ABDOMINAL SURGERY    . CESAREAN SECTION     x 3  . DILATION AND CURETTAGE OF UTERUS    . ENDOMETRIAL ABLATION    . NOSE SURGERY    . TUBAL LIGATION       OB History   No obstetric history on file.      Home Medications    Prior to Admission medications   Medication Sig Start Date End Date Taking? Authorizing Provider  albuterol (PROVENTIL  HFA;VENTOLIN HFA) 108 (90 Base) MCG/ACT inhaler Inhale 2 puffs into the lungs every 4 (four) hours as needed for wheezing or shortness of breath. 04/19/17   Doug Sou, MD  albuterol (PROVENTIL) (2.5 MG/3ML) 0.083% nebulizer solution Take 3 mLs (2.5 mg total) by nebulization every 4 (four) hours as needed for wheezing or shortness of breath. 04/19/17   Doug Sou, MD  chlorpheniramine-HYDROcodone (TUSSIONEX PENNKINETIC ER) 10-8 MG/5ML SUER Take 5 mLs by mouth every 12 (twelve) hours as needed. 04/26/18   Molpus, John, MD  clonazePAM (KLONOPIN) 1 MG tablet Take 1 mg by mouth at bedtime as needed for anxiety.  01/04/18   [provider]  FLUoxetine (PROZAC) 40 MG capsule Take 80 mg by mouth daily.  03/23/18   [provider]  oseltamivir (TAMIFLU) 75 MG capsule Take 1 capsule (75 mg total) by mouth every 12 (twelve) hours. 05/29/18   Vanetta Mulders, MD  traZODone (DESYREL) 50 MG tablet Take 100 mg by mouth at bedtime.  03/25/18   [provider]    Family History History reviewed. No pertinent family history.  Social History Social History   Tobacco Use  . Smoking status: Never Smoker  . Smokeless tobacco: Never Used  Substance Use Topics  . Alcohol use: Not Currently    Comment: occassionally  . Drug use: No  Allergies   Iron; Iron dextran; Latex; Banana; and Penicillins   Review of Systems Review of Systems  Constitutional: Positive for fever. Negative for chills.  HENT: Positive for congestion and sore throat. Negative for rhinorrhea.   Eyes: Negative for visual disturbance.  Respiratory: Positive for cough and shortness of breath.   Cardiovascular: Negative for chest pain and leg swelling.  Gastrointestinal: Negative for abdominal pain, diarrhea, nausea and vomiting.  Genitourinary: Negative for dysuria.  Musculoskeletal: Positive for myalgias. Negative for back pain and neck pain.  Skin: Negative for rash.  Neurological: Negative for  dizziness, light-headedness and headaches.  Hematological: Does not bruise/bleed easily.  Psychiatric/Behavioral: Negative for confusion.     Physical Exam Updated Vital Signs BP 120/78   Pulse (!) 101   Temp 99.4 F (37.4 C) (Oral)   Resp (!) 22   Ht 1.676 m (5\' 6" )   Wt 90.7 kg   LMP 10/19/2015   SpO2 98%   BMI 32.28 kg/m   Physical Exam Vitals signs and nursing note reviewed.  Constitutional:      General: She is not in acute distress.    Appearance: Normal appearance. She is well-developed. She is not toxic-appearing.  HENT:     Head: Normocephalic and atraumatic.     Nose: No congestion.     Mouth/Throat:     Mouth: Mucous membranes are moist.     Pharynx: Posterior oropharyngeal erythema present. No oropharyngeal exudate.  Eyes:     Extraocular Movements: Extraocular movements intact.     Conjunctiva/sclera: Conjunctivae normal.     Pupils: Pupils are equal, round, and reactive to light.  Neck:     Musculoskeletal: Normal range of motion and neck supple.  Cardiovascular:     Rate and Rhythm: Regular rhythm. Tachycardia present.     Heart sounds: No murmur.  Pulmonary:     Effort: Pulmonary effort is normal. No respiratory distress.     Breath sounds: Normal breath sounds. No wheezing, rhonchi or rales.  Abdominal:     Palpations: Abdomen is soft.     Tenderness: There is no abdominal tenderness.  Musculoskeletal: Normal range of motion.  Skin:    General: Skin is warm and dry.     Capillary Refill: Capillary refill takes less than 2 seconds.  Neurological:     General: No focal deficit present.     Mental Status: She is alert and oriented to person, place, and time.      ED Treatments / Results  Labs (all labs ordered are listed, but only abnormal results are displayed) Labs Reviewed - No data to display  EKG None  Radiology Dg Chest 2 View  Result Date: 05/29/2018 CLINICAL DATA:  Cough and fever for 2 days. EXAM: CHEST - 2 VIEW COMPARISON:   05/13/2018 FINDINGS: The cardiomediastinal contours are normal. The lungs are clear. Pulmonary vasculature is normal. No consolidation, pleural effusion, or pneumothorax. No acute osseous abnormalities are seen. IMPRESSION: Unremarkable radiographs of the chest. Electronically Signed   By: Narda Rutherford M.D.   On: 05/29/2018 20:12    Procedures Procedures (including critical care time)  Medications Ordered in ED Medications  acetaminophen (TYLENOL) tablet 650 mg (650 mg Oral Given 05/29/18 2021)  oseltamivir (TAMIFLU) capsule 75 mg (75 mg Oral Given 05/29/18 2158)     Initial Impression / Assessment and Plan / ED Course  I have reviewed the triage vital signs and the nursing notes.  Pertinent labs & imaging results that were available during  my care of the patient were reviewed by me and considered in my medical decision making (see chart for details).        Patient presenting with flulike symptoms.  Febrile cough and shortness of breath for 2 days.  T-max at home was 103.  Upon presentation here temp was 101.2.  Chest x-ray negative for pneumonia.  Patient is in the window for Tamiflu.  First dose of Tamiflu Tamiflu provided here.  Patient not hypotensive did have some tachycardia nontoxic in appearance.  No wheezing no rales or rhonchi.  Clinically feel that the tachycardia is secondary to the fever.  Do not feel that patient is clinically septic at this time.  We will treat symptomatically for the flu along with Tamiflu.    Final Clinical Impressions(s) / ED Diagnoses   Final diagnoses:  Flu-like symptoms    ED Discharge Orders         Ordered    oseltamivir (TAMIFLU) 75 MG capsule  Every 12 hours     05/29/18 2306           Vanetta MuldersZackowski, Jawuan Robb, MD 05/30/18 1546

## 2018-09-02 ENCOUNTER — Emergency Department (HOSPITAL_BASED_OUTPATIENT_CLINIC_OR_DEPARTMENT_OTHER)
Admission: EM | Admit: 2018-09-02 | Discharge: 2018-09-02 | Disposition: A | Discharge status: Home / Self Care | Payer: Medicaid Other | Attending: Emergency Medicine | Admitting: Emergency Medicine

## 2018-09-02 ENCOUNTER — Other Ambulatory Visit: Payer: Self-pay

## 2018-09-02 ENCOUNTER — Encounter (HOSPITAL_BASED_OUTPATIENT_CLINIC_OR_DEPARTMENT_OTHER): Payer: Self-pay | Admitting: *Deleted

## 2018-09-02 DIAGNOSIS — L509 Urticaria, unspecified: Secondary | ICD-10-CM | POA: Diagnosis present

## 2018-09-02 DIAGNOSIS — Z91040 Latex allergy status: Secondary | ICD-10-CM | POA: Diagnosis not present

## 2018-09-02 DIAGNOSIS — J45909 Unspecified asthma, uncomplicated: Secondary | ICD-10-CM | POA: Diagnosis not present

## 2018-09-02 DIAGNOSIS — I10 Essential (primary) hypertension: Secondary | ICD-10-CM | POA: Diagnosis not present

## 2018-09-02 DIAGNOSIS — Z79899 Other long term (current) drug therapy: Secondary | ICD-10-CM | POA: Insufficient documentation

## 2018-09-02 DIAGNOSIS — T7840XA Allergy, unspecified, initial encounter: Secondary | ICD-10-CM | POA: Diagnosis not present

## 2018-09-02 MED ORDER — METHYLPREDNISOLONE SODIUM SUCC 125 MG IJ SOLR
125.0000 mg | Freq: Once | INTRAMUSCULAR | Status: AC
  Administered 2018-09-02: 18:00:00 125 mg via INTRAVENOUS
  Filled 2018-09-02: qty 2

## 2018-09-02 MED ORDER — FAMOTIDINE 20 MG PO TABS: 20.0000 mg | ORAL_TABLET | Freq: Two times a day (BID) | ORAL | 0 refills | Status: DC | PRN

## 2018-09-02 MED ORDER — FAMOTIDINE IN NACL 20-0.9 MG/50ML-% IV SOLN
20.0000 mg | Freq: Once | INTRAVENOUS | Status: AC
  Administered 2018-09-02: 18:00:00 20 mg via INTRAVENOUS
  Filled 2018-09-02: qty 50

## 2018-09-02 MED ORDER — DIPHENHYDRAMINE HCL 50 MG/ML IJ SOLN
25.0000 mg | Freq: Once | INTRAMUSCULAR | Status: AC
  Administered 2018-09-02: 25 mg via INTRAVENOUS
  Filled 2018-09-02: qty 1

## 2018-09-02 MED ORDER — PREDNISONE 20 MG PO TABS: 40.0000 mg | ORAL_TABLET | Freq: Every day | ORAL | 0 refills | Status: DC

## 2018-09-02 NOTE — ED Provider Notes (Signed)
[facility] EMERGENCY DEPARTMENT Provider Note   CSN: [id number] Arrival date & time: 09/02/18  1714     History   Chief Complaint Chief Complaint  Patient presents with  . Rash    HPI Tanya Herrera is a 33 y.o. female.     The history is provided by the patient and medical records. No language interpreter was used.  Rash  Tanya Herrera is a 33 y.o. female who presents the emergency department complaining of possible allergic reaction.  About 30 minutes prior to emergency department arrival, she suddenly began itching all over.  She reports hives to several areas of her body including face, trunk and arms.  Itching continue to get worse, prompting her to come to the emergency department.  No foods that she has not tried before.  She states that she had a facial at about 1230 this afternoon.  She is wondering if she may have been allergic to something used when she got her facial.  Other than this appointment, she has had no new lotions, detergents, soaps etc.  She denies any shortness of breath, cough, oral swelling or feeling as if her throat may close.  No abdominal pain, nausea, diarrhea.  No medications taken prior to arrival for symptoms.  Past Medical History:  Diagnosis Date  . Asthma   . Family history of adverse reaction to anesthesia   . H/O transfusion of packed red blood cells   . Hemophilia ([place])   . HHT (hereditary hemorrhagic telangiectasia) ([place])   . Hypertension   . Insomnia   . Major depressive disorder   . Panic disorder   . PTSD (post-traumatic stress disorder)     Patient Active Problem List   Diagnosis Date Noted  . MDD (major depressive disorder), severe ([place]) 04/09/2018  . Asthma 01/25/2015  . Gastro-esophageal reflux disease without esophagitis 01/25/2015  . Obesity with body mass index 30 or greater 08/04/2014  . Allergic rhinitis due to pollen 06/23/2014  . Anxiety 07/27/2013  . Essential (primary) hypertension 07/27/2013  .  Anemia 06/28/2013  . Major depressive disorder, single episode, unspecified 11/26/2011  . Migraine without aura 07/16/2011    Past Surgical History:  Procedure Laterality Date  . ABDOMINAL HYSTERECTOMY    . ABDOMINAL SURGERY    . CESAREAN SECTION     x 3  . DILATION AND CURETTAGE OF UTERUS    . ENDOMETRIAL ABLATION    . NOSE SURGERY    . TUBAL LIGATION       OB History   No obstetric history on file.      Home Medications    Prior to Admission medications   Medication Sig Start Date End Date Taking? Authorizing Provider  chlorpheniramine-HYDROcodone (TUSSIONEX PENNKINETIC ER) 10-8 MG/5ML SUER Take 5 mLs by mouth every 12 (twelve) hours as needed. 04/26/18  Yes Molpus, John, MD  clonazePAM (KLONOPIN) 1 MG tablet Take 1 mg by mouth at bedtime as needed for anxiety.  01/04/18  Yes [provider]  traZODone (DESYREL) 50 MG tablet Take 100 mg by mouth at bedtime.  03/25/18  Yes [provider]  albuterol (PROVENTIL HFA;VENTOLIN HFA) 108 (90 Base) MCG/ACT inhaler Inhale 2 puffs into the lungs every 4 (four) hours as needed for wheezing or shortness of breath. 04/19/17   Orlie Dakin, MD  albuterol (PROVENTIL) (2.5 MG/3ML) 0.083% nebulizer solution Take 3 mLs (2.5 mg total) by nebulization every 4 (four) hours as needed for wheezing or shortness of breath. 04/19/17  Jacubowitz, Sam, MD  famotidine (PEPCID) 20 MG tablet Take 1 tabDoug Soulet (20 mg total) by mouth 2 (two) times daily as needed (itching, rash). 09/02/18   Enas Winchel, Chase PicketJaime Pilcher, PA-C  FLUoxetine (PROZAC) 40 MG capsule Take 80 mg by mouth daily.  03/23/18   [provider]  oseltamivir (TAMIFLU) 75 MG capsule Take 1 capsule (75 mg total) by mouth every 12 (twelve) hours. 05/29/18   Vanetta MuldersZackowski, Scott, MD  predniSONE (DELTASONE) 20 MG tablet Take 2 tablets (40 mg total) by mouth daily. 09/02/18   Ridhaan Dreibelbis, Chase PicketJaime Pilcher, PA-C    Family History No family history on file.  Social History Social History   Tobacco  Use  . Smoking status: Never Smoker  . Smokeless tobacco: Never Used  Substance Use Topics  . Alcohol use: Not Currently    Comment: occassionally  . Drug use: No     Allergies   Iron, Iron dextran, Latex, Banana, and Penicillins   Review of Systems Review of Systems  Skin: Positive for rash.  All other systems reviewed and are negative.    Physical Exam Updated Vital Signs BP 122/80   Pulse 91   Temp 98.1 F (36.7 C) (Oral)   Resp 20   Ht 5\' 6"  (1.676 m)   Wt 101.7 kg   LMP 10/19/2015   SpO2 98%   BMI 36.17 kg/m   Physical Exam Vitals signs and nursing note reviewed.  Constitutional:      General: She is not in acute distress.    Appearance: She is well-developed.  HENT:     Head: Normocephalic and atraumatic.     Mouth/Throat:     Comments: Airway patent.  No oral swelling/angioedema. Neck:     Musculoskeletal: Neck supple.  Cardiovascular:     Rate and Rhythm: Normal rate and regular rhythm.     Heart sounds: Normal heart sounds. No murmur.  Pulmonary:     Effort: Pulmonary effort is normal. No respiratory distress.     Breath sounds: Normal breath sounds.     Comments: Lungs are clear to auscultation bilaterally. Abdominal:     General: There is no distension.     Palpations: Abdomen is soft.     Tenderness: There is no abdominal tenderness.  Skin:    General: Skin is warm and dry.     Comments: Scattered hives to trunk and upper extremities.  She has one area to the left upper forehead as well.  Neurological:     Mental Status: She is alert and oriented to person, place, and time.      ED Treatments / Results  Labs (all labs ordered are listed, but only abnormal results are displayed) Labs Reviewed - No data to display  EKG    Radiology No results found.  Procedures Procedures (including critical care time)  Medications Ordered in ED Medications  methylPREDNISolone sodium succinate (SOLU-MEDROL) 125 mg/2 mL injection 125 mg (125  mg Intravenous Given 09/02/18 1753)  famotidine (PEPCID) IVPB 20 mg premix ( Intravenous Stopped 09/02/18 1826)  diphenhydrAMINE (BENADRYL) injection 25 mg (25 mg Intravenous Given 09/02/18 1753)     Initial Impression / Assessment and Plan / ED Course  I have reviewed the triage vital signs and the nursing notes.  Pertinent labs & imaging results that were available during my care of the patient were reviewed by me and considered in my medical decision making (see chart for details).       Tanya Herrera is a 33 y.o.  female who presents to ED for generalized hives with pruritus which occurred acutely just prior to arrival.  She did get a facial earlier today, but other than that, no known triggers or exposures.  No evidence of oral swelling or airway compromise.  Lungs are clear to auscultation bilaterally and vitals are stable.  No respiratory, GI or neurologic symptoms to suggest anaphylaxis.  Given Benadryl, Pepcid and steroids in ED.  Monitored for over 2 hours and feels much improved.  On reevaluation, her hives are gone.  She feels much better and would like to go home. Evaluation does not show pathology that would require ongoing emergent intervention or inpatient treatment.  Home care instructions and return precautions discussed. PCP follow up encouraged. All questions answered.   Final Clinical Impressions(s) / ED Diagnoses   Final diagnoses:  Allergic reaction, initial encounter    ED Discharge Orders         Ordered    famotidine (PEPCID) 20 MG tablet  2 times daily PRN     09/02/18 1912    predniSONE (DELTASONE) 20 MG tablet  Daily     09/02/18 1912           Lydia Toren, Chase PicketJaime Pilcher, PA-C 09/02/18 1919    Blane OharaZavitz, Joshua, MD 09/02/18 2316

## 2018-09-02 NOTE — Discharge Instructions (Signed)
Benadryl and Pepcid as needed for itching.  Start taking prednisone daily in the morning.   You should return to the emergency department if you have difficulty breathing, feel as if you cannot swallow, new or worsening symptoms develop, any additional concerns.

## 2018-09-02 NOTE — ED Triage Notes (Signed)
Hives after getting a facial today. No respiratory distress.

## 2018-09-03 ENCOUNTER — Emergency Department (HOSPITAL_BASED_OUTPATIENT_CLINIC_OR_DEPARTMENT_OTHER): Payer: Medicaid Other

## 2018-09-03 ENCOUNTER — Other Ambulatory Visit: Payer: Self-pay

## 2018-09-03 ENCOUNTER — Observation Stay (HOSPITAL_BASED_OUTPATIENT_CLINIC_OR_DEPARTMENT_OTHER)
Admission: EM | Admit: 2018-09-03 | Discharge: 2018-09-04 | Disposition: A | Discharge status: Home / Self Care | Payer: Medicaid Other | Attending: Internal Medicine | Admitting: Internal Medicine

## 2018-09-03 DIAGNOSIS — F418 Other specified anxiety disorders: Secondary | ICD-10-CM | POA: Diagnosis not present

## 2018-09-03 DIAGNOSIS — K219 Gastro-esophageal reflux disease without esophagitis: Secondary | ICD-10-CM | POA: Diagnosis present

## 2018-09-03 DIAGNOSIS — I780 Hereditary hemorrhagic telangiectasia: Secondary | ICD-10-CM | POA: Diagnosis not present

## 2018-09-03 DIAGNOSIS — Z880 Allergy status to penicillin: Secondary | ICD-10-CM | POA: Diagnosis not present

## 2018-09-03 DIAGNOSIS — E669 Obesity, unspecified: Secondary | ICD-10-CM | POA: Diagnosis present

## 2018-09-03 DIAGNOSIS — Z713 Dietary counseling and surveillance: Secondary | ICD-10-CM | POA: Diagnosis not present

## 2018-09-03 DIAGNOSIS — Z7952 Long term (current) use of systemic steroids: Secondary | ICD-10-CM | POA: Diagnosis not present

## 2018-09-03 DIAGNOSIS — R197 Diarrhea, unspecified: Secondary | ICD-10-CM | POA: Diagnosis not present

## 2018-09-03 DIAGNOSIS — T7840XD Allergy, unspecified, subsequent encounter: Secondary | ICD-10-CM | POA: Diagnosis present

## 2018-09-03 DIAGNOSIS — Z91040 Latex allergy status: Secondary | ICD-10-CM | POA: Insufficient documentation

## 2018-09-03 DIAGNOSIS — E876 Hypokalemia: Secondary | ICD-10-CM | POA: Diagnosis not present

## 2018-09-03 DIAGNOSIS — D72829 Elevated white blood cell count, unspecified: Secondary | ICD-10-CM | POA: Insufficient documentation

## 2018-09-03 DIAGNOSIS — G4700 Insomnia, unspecified: Secondary | ICD-10-CM | POA: Diagnosis not present

## 2018-09-03 DIAGNOSIS — L500 Allergic urticaria: Principal | ICD-10-CM | POA: Insufficient documentation

## 2018-09-03 DIAGNOSIS — F4310 Post-traumatic stress disorder, unspecified: Secondary | ICD-10-CM | POA: Diagnosis not present

## 2018-09-03 DIAGNOSIS — Z1159 Encounter for screening for other viral diseases: Secondary | ICD-10-CM | POA: Insufficient documentation

## 2018-09-03 DIAGNOSIS — Z79899 Other long term (current) drug therapy: Secondary | ICD-10-CM | POA: Diagnosis not present

## 2018-09-03 DIAGNOSIS — Z888 Allergy status to other drugs, medicaments and biological substances status: Secondary | ICD-10-CM | POA: Insufficient documentation

## 2018-09-03 DIAGNOSIS — F419 Anxiety disorder, unspecified: Secondary | ICD-10-CM | POA: Diagnosis present

## 2018-09-03 DIAGNOSIS — I10 Essential (primary) hypertension: Secondary | ICD-10-CM | POA: Insufficient documentation

## 2018-09-03 DIAGNOSIS — Z6836 Body mass index (BMI) 36.0-36.9, adult: Secondary | ICD-10-CM | POA: Diagnosis not present

## 2018-09-03 DIAGNOSIS — T782XXA Anaphylactic shock, unspecified, initial encounter: Secondary | ICD-10-CM | POA: Diagnosis present

## 2018-09-03 DIAGNOSIS — J45909 Unspecified asthma, uncomplicated: Secondary | ICD-10-CM | POA: Diagnosis not present

## 2018-09-03 DIAGNOSIS — T7840XA Allergy, unspecified, initial encounter: Secondary | ICD-10-CM | POA: Diagnosis present

## 2018-09-03 LAB — COMPREHENSIVE METABOLIC PANEL
ALT: 48 U/L — ABNORMAL HIGH (ref 0–44)
AST: 41 U/L (ref 15–41)
Albumin: 4.6 g/dL (ref 3.5–5.0)
Alkaline Phosphatase: 70 U/L (ref 38–126)
Anion gap: 10 (ref 5–15)
BUN: 13 mg/dL (ref 6–20)
CO2: 20 mmol/L — ABNORMAL LOW (ref 22–32)
Calcium: 9.5 mg/dL (ref 8.9–10.3)
Chloride: 108 mmol/L (ref 98–111)
Creatinine, Ser: 0.72 mg/dL (ref 0.44–1.00)
GFR calc Af Amer: >60 mL/min (ref >60)
GFR calc non Af Amer: >60 mL/min (ref >60)
Glucose, Bld: 119 mg/dL — ABNORMAL HIGH (ref 70–99)
Potassium: 3.4 mmol/L — ABNORMAL LOW (ref 3.5–5.1)
Sodium: 138 mmol/L (ref 135–145)
Total Bilirubin: 0.7 mg/dL (ref 0.3–1.2)
Total Protein: 8.5 g/dL — ABNORMAL HIGH (ref 6.5–8.1)

## 2018-09-03 LAB — CBC WITH DIFFERENTIAL/PLATELET
Abs Immature Granulocytes: 0.05 10*3/uL (ref 0.00–0.07)
Basophils Absolute: 0 10*3/uL (ref 0.0–0.1)
Basophils Relative: 0 %
Eosinophils Absolute: 0 10*3/uL (ref 0.0–0.5)
Eosinophils Relative: 0 %
HCT: 43.9 % (ref 36.0–46.0)
Hemoglobin: 14.8 g/dL (ref 12.0–15.0)
Immature Granulocytes: 0 %
Lymphocytes Relative: 15 %
Lymphs Abs: 2.3 10*3/uL (ref 0.7–4.0)
MCH: 29.4 pg (ref 26.0–34.0)
MCHC: 33.7 g/dL (ref 30.0–36.0)
MCV: 87.1 fL (ref 80.0–100.0)
Monocytes Absolute: 0.9 10*3/uL (ref 0.1–1.0)
Monocytes Relative: 6 %
Neutro Abs: 12.2 10*3/uL — ABNORMAL HIGH (ref 1.7–7.7)
Neutrophils Relative %: 79 %
Platelets: 293 10*3/uL (ref 150–400)
RBC: 5.04 MIL/uL (ref 3.87–5.11)
RDW: 13.2 % (ref 11.5–15.5)
WBC: 15.5 10*3/uL — ABNORMAL HIGH (ref 4.0–10.5)
nRBC: 0 % (ref 0.0–0.2)

## 2018-09-03 LAB — TROPONIN I: Troponin I: <0.03 ng/mL (ref <0.03)

## 2018-09-03 LAB — SARS CORONAVIRUS 2 AG (30 MIN TAT): SARS Coronavirus 2 Ag: NEGATIVE

## 2018-09-03 MED ORDER — IOHEXOL 350 MG/ML SOLN
100.0000 mL | Freq: Once | INTRAVENOUS | Status: AC | PRN
  Administered 2018-09-03: 57 mL via INTRAVENOUS

## 2018-09-03 MED ORDER — ONDANSETRON HCL 4 MG/2ML IJ SOLN
4.0000 mg | Freq: Once | INTRAMUSCULAR | Status: AC
  Administered 2018-09-03: 4 mg via INTRAVENOUS
  Filled 2018-09-03: qty 2

## 2018-09-03 MED ORDER — DIPHENHYDRAMINE HCL 50 MG/ML IJ SOLN
25.0000 mg | Freq: Once | INTRAMUSCULAR | Status: AC
  Administered 2018-09-03: 25 mg via INTRAVENOUS
  Filled 2018-09-03: qty 1

## 2018-09-03 MED ORDER — FAMOTIDINE IN NACL 20-0.9 MG/50ML-% IV SOLN
20.0000 mg | Freq: Once | INTRAVENOUS | Status: AC
  Administered 2018-09-03: 20 mg via INTRAVENOUS
  Filled 2018-09-03: qty 50

## 2018-09-03 MED ORDER — METHYLPREDNISOLONE SODIUM SUCC 125 MG IJ SOLR
125.0000 mg | Freq: Once | INTRAMUSCULAR | Status: AC
  Administered 2018-09-03: 125 mg via INTRAVENOUS
  Filled 2018-09-03: qty 2

## 2018-09-03 MED ORDER — SODIUM CHLORIDE 0.9 % IV BOLUS
1000.0000 mL | Freq: Once | INTRAVENOUS | Status: AC
  Administered 2018-09-03: 1000 mL via INTRAVENOUS

## 2018-09-03 NOTE — ED Notes (Signed)
Spoke with Tanya Herrera at Bed Placement regarding the bed status.  She related that there would not be a nurse until after 11 pm ad then the bed would be ready.

## 2018-09-03 NOTE — ED Notes (Signed)
Report given to [facility]

## 2018-09-03 NOTE — ED Notes (Signed)
[facility] notified (Kim) - patient ready for transport 

## 2018-09-03 NOTE — ED Notes (Signed)
Pt stated that she was still itching. RN notified EDP via secure chat and orders placed.

## 2018-09-03 NOTE — ED Provider Notes (Signed)
[facility] EMERGENCY DEPARTMENT Provider Note   CSN: [id number] Arrival date & time: 09/03/18  1738    History   Chief Complaint Chief Complaint  Patient presents with   Shortness of Breath    HPI Tanya Herrera is a 33 y.o. female.     The history is provided by the patient and medical records. No language interpreter was used.  Shortness of Breath Associated symptoms: rash   Associated symptoms: no abdominal pain, no chest pain, no cough, no fever, no vomiting and no wheezing    Tanya Herrera is a 33 y.o. female  with a PMH as listed below who presents to the Emergency Department complaining of shortness of breath.  Patient was seen in the emergency department yesterday by myself for presumed allergic reaction.  At that time, she came to the emergency department for generalized itching and hives.  I gave her Solu-Medrol, Benadryl and Pepcid.  On reevaluation, hives had nearly resolved and she felt much better.  I told her should she develop any shortness of breath, new GI symptoms to return to ED.  She tells me that when she awoke this morning, she had nausea and several loose stools.  About an hour ago, she started feeling as if it was difficult for her to breathe and as if her heart was pounding.  This has been persistent.  Denies any chest pain.  Denies any fevers.  She still states that she has not had any new foods or other exposures that could have caused reaction other than facial yesterday around noon.  Denies history of similar symptoms in the past.  Past Medical History:  Diagnosis Date   Asthma    Family history of adverse reaction to anesthesia    H/O transfusion of packed red blood cells    Hemophilia (HCC)    HHT (hereditary hemorrhagic telangiectasia) (HCC)    Hypertension    Insomnia    Major depressive disorder    Panic disorder    PTSD (post-traumatic stress disorder)     Patient Active Problem List   Diagnosis Date Noted   MDD  (major depressive disorder), severe (HCC) 04/09/2018   Asthma 01/25/2015   Gastro-esophageal reflux disease without esophagitis 01/25/2015   Obesity with body mass index 30 or greater 08/04/2014   Allergic rhinitis due to pollen 06/23/2014   Anxiety 07/27/2013   Essential (primary) hypertension 07/27/2013   Anemia 06/28/2013   Major depressive disorder, single episode, unspecified 11/26/2011   Migraine without aura 07/16/2011    Past Surgical History:  Procedure Laterality Date   ABDOMINAL HYSTERECTOMY     ABDOMINAL SURGERY     CESAREAN SECTION     x 3   DILATION AND CURETTAGE OF UTERUS     ENDOMETRIAL ABLATION     NOSE SURGERY     TUBAL LIGATION       OB History   No obstetric history on file.      Home Medications    Prior to Admission medications   Medication Sig Start Date End Date Taking? Authorizing Provider  albuterol (PROVENTIL HFA;VENTOLIN HFA) 108 (90 Base) MCG/ACT inhaler Inhale 2 puffs into the lungs every 4 (four) hours as needed for wheezing or shortness of breath. 04/19/17   Doug SouJacubowitz, Sam, MD  albuterol (PROVENTIL) (2.5 MG/3ML) 0.083% nebulizer solution Take 3 mLs (2.5 mg total) by nebulization every 4 (four) hours as needed for wheezing or shortness of breath. 04/19/17   Doug SouJacubowitz, Sam, MD  chlorpheniramine-HYDROcodone [facility](TUSSIONEX PENNKINETIC  ER) 10-8 MG/5ML SUER Take 5 mLs by mouth every 12 (twelve) hours as needed. 04/26/18   Molpus, John, MD  clonazePAM (KLONOPIN) 1 MG tablet Take 1 mg by mouth at bedtime as needed for anxiety.  01/04/18   [provider]  famotidine (PEPCID) 20 MG tablet Take 1 tablet (20 mg total) by mouth 2 (two) times daily as needed (itching, rash). 09/02/18   Rudy Luhmann, Chase PicketJaime Pilcher, PA-C  FLUoxetine (PROZAC) 40 MG capsule Take 80 mg by mouth daily.  03/23/18   [provider]  oseltamivir (TAMIFLU) 75 MG capsule Take 1 capsule (75 mg total) by mouth every 12 (twelve) hours. 05/29/18   Vanetta MuldersZackowski, Scott, MD    predniSONE (DELTASONE) 20 MG tablet Take 2 tablets (40 mg total) by mouth daily. 09/02/18   Aliegha Paullin, Chase PicketJaime Pilcher, PA-C  traZODone (DESYREL) 50 MG tablet Take 100 mg by mouth at bedtime.  03/25/18   [provider]    Family History No family history on file.  Social History Social History   Tobacco Use   Smoking status: Never Smoker   Smokeless tobacco: Never Used  Substance Use Topics   Alcohol use: Not Currently    Comment: occassionally   Drug use: No     Allergies   Iron, Iron dextran, Latex, Banana, and Penicillins   Review of Systems Review of Systems  Constitutional: Negative for fever.  Respiratory: Positive for shortness of breath. Negative for cough and wheezing.   Cardiovascular: Positive for palpitations. Negative for chest pain and leg swelling.  Gastrointestinal: Positive for diarrhea and nausea. Negative for abdominal pain, blood in stool, constipation and vomiting.  Skin: Positive for rash.  All other systems reviewed and are negative.    Physical Exam Updated Vital Signs BP (!) 149/89    Pulse (!) 101    Temp 97.7 F (36.5 C) (Oral)    Resp 19    Ht 5\' 6"  (1.676 m)    Wt 101.6 kg    LMP 10/19/2015    SpO2 96%    BMI 36.15 kg/m   Physical Exam Vitals signs and nursing note reviewed.  Constitutional:      General: She is not in acute distress.    Appearance: She is well-developed.  HENT:     Head: Normocephalic and atraumatic.     Mouth/Throat:     Comments: Airway patent.  Tolerating secretions fine. Neck:     Musculoskeletal: Neck supple.  Cardiovascular:     Heart sounds: Normal heart sounds. No murmur.     Comments: Tachycardic, but regular. Pulmonary:     Effort: No respiratory distress.     Breath sounds: Normal breath sounds.     Comments: Tachypneic with slight increased effort in her breathing.  Lungs are clear to auscultation bilaterally. Abdominal:     General: There is no distension.     Palpations: Abdomen is soft.      Tenderness: There is no abdominal tenderness.  Skin:    General: Skin is warm and dry.  Neurological:     Mental Status: She is alert and oriented to person, place, and time.      ED Treatments / Results  Labs (all labs ordered are listed, but only abnormal results are displayed) Labs Reviewed  CBC WITH DIFFERENTIAL/PLATELET - Abnormal; Notable for the following components:      Result Value   WBC 15.5 (*)    Neutro Abs 12.2 (*)    All other components within normal limits  COMPREHENSIVE METABOLIC PANEL - Abnormal; Notable for the following components:   Potassium 3.4 (*)    CO2 20 (*)    Glucose, Bld 119 (*)    Total Protein 8.5 (*)    ALT 48 (*)    All other components within normal limits  SARS CORONAVIRUS 2 (HOSP ORDER, PERFORMED IN [facility] VIA ABBOTT ID)  TROPONIN I    EKG None ED ECG REPORT   Date: 09/03/2018  Rate: 117  Rhythm: sinus tachycardia  QRS Axis: normal  Intervals: normal  ST/T Wave abnormalities: normal  Conduction Disutrbances:none  Narrative Interpretation:   I have personally reviewed the EKG tracing with attending, Dr. Particia NearingHaviland and agree with the computerized printout as noted.  Radiology Ct Angio Chest Pe W And/or Wo Contrast  Result Date: 09/03/2018 CLINICAL DATA:  Shortness of breath EXAM: CT ANGIOGRAPHY CHEST WITH CONTRAST TECHNIQUE: Multidetector CT imaging of the chest was performed using the standard protocol during bolus administration of intravenous contrast. Multiplanar CT image reconstructions and MIPs were obtained to evaluate the vascular anatomy. CONTRAST:  57mL OMNIPAQUE IOHEXOL 350 MG/ML SOLN COMPARISON:  None. FINDINGS: Cardiovascular: Heart is normal size. Aorta is normal caliber. No filling defects in the pulmonary arteries to suggestpulmonary emboli. Mediastinum/Nodes: Soft tissue in the anterior mediastinum felt represent residual thymus. No mediastinal, hilar, or axillary adenopathy. Lungs/Pleura: Lungs are clear. No  focal airspace opacities or suspicious nodules. No effusions. Upper Abdomen: Heterogeneous enhancement in the liver. Diffuse low-density. Findings likely reflect fatty infiltration. Musculoskeletal: Chest wall soft tissues are unremarkable. No acute bony abnormality. Review of the MIP images confirms the above findings. IMPRESSION: No evidence of pulmonary embolus. No acute cardiopulmonary disease. Suspect fatty infiltration of the liver. Electronically Signed   By: Charlett NoseKevin  Dover M.D.   On: 09/03/2018 20:19   Dg Chest Portable 1 View  Result Date: 09/03/2018 CLINICAL DATA:  Pt. Here yesterday for Allergic reaction to something she reports. Pt. Said she broke out in hives on yesterday and came to ED with symptoms. Pt. Was seen here at Med center for Allergic reaction. Pt. Today having shortness o.*comment was truncated*Shortness of breath EXAM: PORTABLE CHEST 1 VIEW COMPARISON:  None. FINDINGS: Normal mediastinum and cardiac silhouette. Normal pulmonary vasculature. No evidence of effusion, infiltrate, or pneumothorax. No acute bony abnormality. IMPRESSION: Normal chest radiograph Electronically Signed   By: Genevive BiStewart  Edmunds M.D.   On: 09/03/2018 18:43    Procedures Procedures (including critical care time)  Medications Ordered in ED Medications  sodium chloride 0.9 % bolus 1,000 mL (0 mLs Intravenous Stopped 09/03/18 2009)  methylPREDNISolone sodium succinate (SOLU-MEDROL) 125 mg/2 mL injection 125 mg (125 mg Intravenous Given 09/03/18 1824)  famotidine (PEPCID) IVPB 20 mg premix (0 mg Intravenous Stopped 09/03/18 1900)  diphenhydrAMINE (BENADRYL) injection 25 mg (25 mg Intravenous Given 09/03/18 1825)  iohexol (OMNIPAQUE) 350 MG/ML injection 100 mL (57 mLs Intravenous Contrast Given 09/03/18 1935)  diphenhydrAMINE (BENADRYL) injection 25 mg (25 mg Intravenous Given 09/03/18 2024)     Initial Impression / Assessment and Plan / ED Course  I have reviewed the triage vital signs and the nursing  notes.  Pertinent labs & imaging results that were available during my care of the patient were reviewed by me and considered in my medical decision making (see chart for details).       Tanya Herrera is a 33 y.o. female who presents to ED for shortness of breath.  Seen in the emergency department yesterday by myself.  At that  time, she presented to the ED for generalized itching and hives. She had gotten a facial, but nothing else out of her ordinary.  She was given Solu-Medrol, Benadryl and Pepcid.  On reevaluation yesterday, her symptoms were very much improved.  She was sent home on steroid burst, Benadryl and Pepcid.  I informed her that she should return to the emergency department should she have any shortness of breath or GI symptoms.  She states that she awoke this morning with nausea and diarrhea, then about an hour prior to arrival had shortness of breath and palpitations.  On initial exam, she was tachycardic in the 130s with tachypnea.  Her lungs were clear to auscultation bilaterally and she was oxygenating well.  97% O2 on room air.  Continuing to itch and does have some hives, but much improved from rash yesterday.  EKG with sinus tachycardia.  Troponin normal.  COVID negative.  Normal chest x-ray. CBC with leukocytosis.  She did receive Solu-Medrol yesterday. CT angio with no acute findings. Re-evaluated and does appear much more comfortable in regards to her breathing. HR improved in the 90's to low 100's. Still complaining of itching. Given her continued symptoms with acute worsening today, hospitalist was consulted for admission.   Patient seen by and discussed with Dr. Gilford Raid who agrees with treatment plan.   Final Clinical Impressions(s) / ED Diagnoses   Final diagnoses:  Allergic reaction, subsequent encounter    ED Discharge Orders    None       Solomia Harrell, Ozella Almond, PA-C 09/03/18 2052    Isla Pence, MD 09/03/18 2141

## 2018-09-03 NOTE — ED Triage Notes (Signed)
Pt. Here yesterday for Allergic reaction to something she reports.   Pt. Said she broke out in hives on yesterday and came to ED with symptoms.  Pt. Was seen here at Med center for Allergic reaction.  Pt. Today having shortness of breath and HR elevation.  Pt. Is able to swallow and talk full sentences.  Pt. Reports shortness of breath started an hour ago and the diarrhea and nausea started at 5am.

## 2018-09-04 ENCOUNTER — Encounter (HOSPITAL_COMMUNITY): Payer: Self-pay

## 2018-09-04 DIAGNOSIS — F419 Anxiety disorder, unspecified: Secondary | ICD-10-CM | POA: Diagnosis not present

## 2018-09-04 DIAGNOSIS — K219 Gastro-esophageal reflux disease without esophagitis: Secondary | ICD-10-CM

## 2018-09-04 DIAGNOSIS — E669 Obesity, unspecified: Secondary | ICD-10-CM

## 2018-09-04 DIAGNOSIS — J4520 Mild intermittent asthma, uncomplicated: Secondary | ICD-10-CM | POA: Diagnosis not present

## 2018-09-04 DIAGNOSIS — T782XXA Anaphylactic shock, unspecified, initial encounter: Secondary | ICD-10-CM

## 2018-09-04 DIAGNOSIS — T7840XA Allergy, unspecified, initial encounter: Secondary | ICD-10-CM | POA: Diagnosis not present

## 2018-09-04 DIAGNOSIS — I10 Essential (primary) hypertension: Secondary | ICD-10-CM | POA: Diagnosis not present

## 2018-09-04 LAB — COMPREHENSIVE METABOLIC PANEL
ALT: 42 U/L (ref 0–44)
AST: 33 U/L (ref 15–41)
Albumin: 4.3 g/dL (ref 3.5–5.0)
Alkaline Phosphatase: 69 U/L (ref 38–126)
Anion gap: 7 (ref 5–15)
BUN: 11 mg/dL (ref 6–20)
CO2: 23 mmol/L (ref 22–32)
Calcium: 9.1 mg/dL (ref 8.9–10.3)
Chloride: 108 mmol/L (ref 98–111)
Creatinine, Ser: 0.76 mg/dL (ref 0.44–1.00)
GFR calc Af Amer: >60 mL/min (ref >60)
GFR calc non Af Amer: >60 mL/min (ref >60)
Glucose, Bld: 180 mg/dL — ABNORMAL HIGH (ref 70–99)
Potassium: 4.2 mmol/L (ref 3.5–5.1)
Sodium: 138 mmol/L (ref 135–145)
Total Bilirubin: 0.4 mg/dL (ref 0.3–1.2)
Total Protein: 8 g/dL (ref 6.5–8.1)

## 2018-09-04 LAB — CBC
HCT: 42.1 % (ref 36.0–46.0)
Hemoglobin: 13.9 g/dL (ref 12.0–15.0)
MCH: 29.3 pg (ref 26.0–34.0)
MCHC: 33 g/dL (ref 30.0–36.0)
MCV: 88.8 fL (ref 80.0–100.0)
Platelets: 290 10*3/uL (ref 150–400)
RBC: 4.74 MIL/uL (ref 3.87–5.11)
RDW: 13.2 % (ref 11.5–15.5)
WBC: 13.9 10*3/uL — ABNORMAL HIGH (ref 4.0–10.5)
nRBC: 0 % (ref 0.0–0.2)

## 2018-09-04 LAB — SARS CORONAVIRUS 2 BY RT PCR (HOSPITAL ORDER, PERFORMED IN ~~LOC~~ HOSPITAL LAB): SARS Coronavirus 2: NEGATIVE

## 2018-09-04 LAB — HIV ANTIBODY (ROUTINE TESTING W REFLEX): HIV Screen 4th Generation wRfx: NONREACTIVE

## 2018-09-04 MED ORDER — METHYLPREDNISOLONE SODIUM SUCC 40 MG IJ SOLR: 40.0000 mg | Freq: Two times a day (BID) | INTRAMUSCULAR | Status: DC

## 2018-09-04 MED ORDER — TRAZODONE HCL 50 MG PO TABS
50.0000 mg | ORAL_TABLET | Freq: Every day | ORAL | Status: DC
  Administered 2018-09-04: 50 mg via ORAL
  Filled 2018-09-04: qty 1

## 2018-09-04 MED ORDER — METHYLPREDNISOLONE 4 MG PO TABS: ORAL_TABLET | ORAL | 0 refills | Status: AC

## 2018-09-04 MED ORDER — DEXTROSE-NACL 5-0.45 % IV SOLN
INTRAVENOUS | Status: DC
  Administered 2018-09-04: 02:00:00 via INTRAVENOUS

## 2018-09-04 MED ORDER — DIPHENHYDRAMINE HCL 25 MG PO CAPS
25.0000 mg | ORAL_CAPSULE | Freq: Four times a day (QID) | ORAL | Status: DC | PRN
  Administered 2018-09-04: 25 mg via ORAL
  Filled 2018-09-04: qty 1

## 2018-09-04 MED ORDER — FAMOTIDINE 20 MG PO TABS
20.0000 mg | ORAL_TABLET | Freq: Two times a day (BID) | ORAL | Status: DC
  Administered 2018-09-04 (×2): 20 mg via ORAL
  Filled 2018-09-04 (×2): qty 1

## 2018-09-04 MED ORDER — TRAZODONE HCL 50 MG PO TABS: 50.0000 mg | ORAL_TABLET | Freq: Every day | ORAL | Status: DC

## 2018-09-04 MED ORDER — ACETAMINOPHEN 325 MG PO TABS
650.0000 mg | ORAL_TABLET | Freq: Four times a day (QID) | ORAL | Status: DC | PRN
  Administered 2018-09-04: 650 mg via ORAL
  Filled 2018-09-04: qty 2

## 2018-09-04 MED ORDER — ONDANSETRON HCL 4 MG/2ML IJ SOLN: 4.0000 mg | Freq: Four times a day (QID) | INTRAMUSCULAR | Status: DC | PRN

## 2018-09-04 MED ORDER — ONDANSETRON HCL 4 MG PO TABS: 4.0000 mg | ORAL_TABLET | Freq: Four times a day (QID) | ORAL | Status: DC | PRN

## 2018-09-04 MED ORDER — DIPHENHYDRAMINE HCL 25 MG PO CAPS: 25.0000 mg | ORAL_CAPSULE | Freq: Four times a day (QID) | ORAL | 0 refills | Status: DC | PRN

## 2018-09-04 NOTE — H&P (Signed)
History and Physical   Tanya Herrera ZOX:[id number] DOB: 15-Sep-1985 DOA: 09/03/2018  Referring MD/NP/PA: Shanna CiscoJamie Ward, PA  PCP: Patient, No Pcp Per   Outpatient Specialists: None  Patient coming from: [facility]  Chief Complaint: Shortness of breath  HPI: Tanya Herrera is a 33 y.o. female with medical history significant of asthma, hypertension, PTSD, HHT, depression with anxiety, chronic insomnia who was seen at [facility] on Thursday with generalized itching facial swelling and hives.  Patient apparently had facial done for the first time the day before.  She subsequently started having significant itching all over with some shortness of breath as well as nausea vomiting and diarrhea a day later.  She was initially given prednisone and supportive care and patient was discharged home.  Patient returned to the ER on Friday with worsening symptoms.  She denied swallowing problems.  She has several loose stools.  Denied exposure to anything new and no new medications.  Patient still having significant itching all over the body with rashes.  She was suspected to have failed outpatient treatment and admitted to the hospital for close observation.  ED Course: Temperature is 98.1 blood pressure 149/89, pulse 137 respiratory rate of 24 oxygen sat 93% room air.  Chemistry appears to be within normal except potassium 3.4.  White count is 15.5.  The rapid COVID 19 test was negative at med center.  Chest x-ray showed no active findings, CT angiogram of the chest also showed no PE. Patient is therefore being admitted to the hospital with significant allergic reaction possible anaphylaxis.  Review of Systems: As per HPI otherwise 10 point review of systems negative.    Past Medical History:  Diagnosis Date   Asthma    Family history of adverse reaction to anesthesia    H/O transfusion of packed red blood cells    Hemophilia (HCC)    HHT (hereditary hemorrhagic telangiectasia)  (HCC)    Hypertension    Insomnia    Major depressive disorder    Panic disorder    PTSD (post-traumatic stress disorder)     Past Surgical History:  Procedure Laterality Date   ABDOMINAL HYSTERECTOMY     ABDOMINAL SURGERY     CESAREAN SECTION     x 3   DILATION AND CURETTAGE OF UTERUS     ENDOMETRIAL ABLATION     NOSE SURGERY     TUBAL LIGATION       reports that she has never smoked. She has never used smokeless tobacco. She reports previous alcohol use. She reports that she does not use drugs.  Allergies  Allergen Reactions   Iron Shortness Of Breath   Iron Dextran Cough, Other (See Comments) and Shortness Of Breath   Latex Hives, Other (See Comments) and Rash   Banana Other (See Comments)    MOUTH ITCH, IRRITATED FEELING   Penicillins Hives    Did it involve swelling of the face/tongue/throat, SOB, or low BP? Yes Did it involve sudden or severe rash/hives, skin peeling, or any reaction on the inside of your mouth or nose? No Did you need to seek medical attention at a hospital or doctor's office? No When did it last happen? If all above answers are NO, may proceed with cephalosporin use.     No family history on file.   Prior to Admission medications   Medication Sig Start Date End Date Taking? Authorizing Provider  albuterol (PROVENTIL HFA;VENTOLIN HFA) 108 (90 Base) MCG/ACT inhaler Inhale 2 puffs  into the lungs every 4 (four) hours as needed for wheezing or shortness of breath. 04/19/17  Yes Doug Sou, MD  albuterol (PROVENTIL) (2.5 MG/3ML) 0.083% nebulizer solution Take 3 mLs (2.5 mg total) by nebulization every 4 (four) hours as needed for wheezing or shortness of breath. 04/19/17  Yes Doug Sou, MD  clonazePAM (KLONOPIN) 1 MG tablet Take 1 mg by mouth at bedtime as needed for anxiety.  01/04/18  Yes [provider]  famotidine (PEPCID) 20 MG tablet Take 1 tablet (20 mg total) by mouth 2 (two) times daily as needed  (itching, rash). 09/02/18  Yes Ward, Chase Picket, PA-C  chlorpheniramine-HYDROcodone (TUSSIONEX PENNKINETIC ER) 10-8 MG/5ML SUER Take 5 mLs by mouth every 12 (twelve) hours as needed. Patient not taking: Reported on 09/04/2018 04/26/18   Molpus, John, MD  FLUoxetine (PROZAC) 40 MG capsule Take 80 mg by mouth daily.  03/23/18   [provider]  oseltamivir (TAMIFLU) 75 MG capsule Take 1 capsule (75 mg total) by mouth every 12 (twelve) hours. Patient not taking: Reported on 09/04/2018 05/29/18   Vanetta Mulders, MD  predniSONE (DELTASONE) 20 MG tablet Take 2 tablets (40 mg total) by mouth daily. Patient not taking: Reported on 09/04/2018 09/02/18   Ward, Chase Picket, PA-C  traZODone (DESYREL) 50 MG tablet Take 100 mg by mouth at bedtime.  03/25/18   [provider]    Physical Exam: Vitals:   09/03/18 2055 09/03/18 2313 09/04/18 0159 09/04/18 0517  BP:  (!) 142/78 118/72 124/89  Pulse:  95 95 80  Resp: 20 (!) Temp:   98.1 F (36.7 C) 98.3 F (36.8 C)  TempSrc:   Oral Oral  SpO2:  93% 95% 100%  Weight:      Height:          Constitutional: NAD, calm, comfortable Vitals:   09/03/18 2055 09/03/18 2313 09/04/18 0159 09/04/18 0517  BP:  (!) 142/78 118/72 124/89  Pulse:  95 95 80  Resp: 20 (!) Temp:   98.1 F (36.7 C) 98.3 F (36.8 C)  TempSrc:   Oral Oral  SpO2:  93% 95% 100%  Weight:      Height:       Eyes: PERRL, lids and conjunctivae normal ENMT: Mucous membranes are moist. Posterior pharynx clear of any exudate or lesions.Normal dentition.  Neck: normal, supple, no masses, no thyromegaly Respiratory: clear to auscultation bilaterally, no wheezing, no crackles. Normal respiratory effort. No accessory muscle use.  Cardiovascular: Regular rate and rhythm, no murmurs / rubs / gallops. No extremity edema. 2+ pedal pulses. No carotid bruits.  Abdomen: no tenderness, no masses palpated. No hepatosplenomegaly. Bowel sounds positive.    Musculoskeletal: no clubbing / cyanosis. No joint deformity upper and lower extremities. Good ROM, no contractures. Normal muscle tone.  Skin: Mild diffuse rashes consistent with hives,, lesions, ulcers. No induration Neurologic: CN 2-12 grossly intact. Sensation intact, DTR normal. Strength 5/5 in all 4.  Psychiatric: Normal judgment and insight. Alert and oriented x 3. Normal mood.     Labs on Admission: I have personally reviewed following labs and imaging studies  CBC: Recent Labs  Lab 09/03/18 1803  WBC 15.5*  NEUTROABS 12.2*  HGB 14.8  HCT 43.9  MCV 87.1  PLT 293   Basic Metabolic Panel: Recent Labs  Lab 09/03/18 1803  NA 138  K 3.4*  CL 108  CO2 20*  GLUCOSE 119*  BUN 13  CREATININE 0.72  CALCIUM 9.5   GFR: Estimated Creatinine Clearance: 122.6 mL/min (by C-G formula based on SCr of 0.72 mg/dL). Liver Function Tests: Recent Labs  Lab 09/03/18 1803  AST 41  ALT 48*  ALKPHOS 70  BILITOT 0.7  PROT 8.5*  ALBUMIN 4.6   No results for input(s): LIPASE, AMYLASE in the last 168 hours. No results for input(s): AMMONIA in the last 168 hours. Coagulation Profile: No results for input(s): INR, PROTIME in the last 168 hours. Cardiac Enzymes: Recent Labs  Lab 09/03/18 1803  TROPONINI <0.03   BNP (last 3 results) No results for input(s): PROBNP in the last 8760 hours. HbA1C: No results for input(s): HGBA1C in the last 72 hours. CBG: No results for input(s): GLUCAP in the last 168 hours. Lipid Profile: No results for input(s): CHOL, HDL, LDLCALC, TRIG, CHOLHDL, LDLDIRECT in the last 72 hours. Thyroid Function Tests: No results for input(s): TSH, T4TOTAL, FREET4, T3FREE, THYROIDAB in the last 72 hours. Anemia Panel: No results for input(s): VITAMINB12, FOLATE, FERRITIN, TIBC, IRON, RETICCTPCT in the last 72 hours. Urine analysis:    Component Value Date/Time   COLORURINE YELLOW 07/30/2017 1743   APPEARANCEUR CLEAR 07/30/2017 1743   LABSPEC >1.030 (H)  07/30/2017 1743   PHURINE 6.0 07/30/2017 1743   GLUCOSEU NEGATIVE 07/30/2017 1743   HGBUR NEGATIVE 07/30/2017 1743   BILIRUBINUR NEGATIVE 07/30/2017 1743   KETONESUR NEGATIVE 07/30/2017 1743   PROTEINUR NEGATIVE 07/30/2017 1743   NITRITE NEGATIVE 07/30/2017 1743   LEUKOCYTESUR NEGATIVE 07/30/2017 1743   Sepsis Labs: @LABRCNTIP (procalcitonin:4,lacticidven:4) ) Recent Results (from the past 240 hour(s))  SARS Coronavirus 2 (Hosp order,Performed in [facility] lab via [employer] ID)     Status: None   Collection Time: 09/03/18  6:08 PM   Specimen: Dry Nasal Swab ([employer] ID Now)  Result Value Ref Range Status   SARS Coronavirus 2 ([employer] ID Now) NEGATIVE NEGATIVE Final    Comment: (NOTE) Interpretive Result Comment(s): COVID 19 Positive SARS CoV 2 target nucleic acids are DETECTED. The SARS CoV 2 RNA is generally detectable in upper and lower respiratory specimens during the acute phase of infection.  Positive results are indicative of active infection with SARS CoV 2.  Clinical correlation with patient history and other diagnostic information is necessary to determine patient infection status.  Positive results do not rule out bacterial infection or coinfection with other viruses. The expected result is Negative. COVID 19 Negative SARS CoV 2 target nucleic acids are NOT DETECTED. The SARS CoV 2 RNA is generally detectable in upper and lower respiratory specimens during the acute phase of infection.  Negative results do not preclude SARS CoV 2 infection, do not rule out coinfections with other pathogens, and should not be used as the sole basis for treatment or other patient management decisions.  Negative results must be combined with clinical  observations, patient history, and epidemiological information. The expected result is Negative. Invalid Presence or absence of SARS CoV 2 nucleic acids cannot be determined. Repeat testing was performed on the submitted specimen and  repeated Invalid results were obtained.  If clinically indicated, additional testing on a new specimen with an alternate test methodology [phone]([id number]) is advised.  The SARS CoV 2 RNA is generally detectable in upper and lower respiratory specimens during the acute phase of infection. The expected result is Negative. Fact Sheet for Patients:  http://www.graves-ford.org/https://www.fda.gov/media/136524/download Fact Sheet for Healthcare Providers: En[employer]Concern.sihttps://www.fda.gov/media/136523/download This test is not yet approved or cleared by the [place] FDA and has been authorized for detection  and/or diagnosis of SARS CoV 2 by FDA under an Emergency Use Authorization (EUA).  This EUA will remain in effect (meaning this test can be used) for the duration of the COVID19 d eclaration under Section 564(b)(1) of the Act, 21 U.S.C. section [phone] 3(b)(1), unless the authorization is terminated or revoked sooner. Performed at [facility], [address] [place], [place] [address]      Radiological Exams on Admission: Ct Angio Chest Pe W And/or Wo Contrast  Result Date: 09/03/2018 CLINICAL DATA:  Shortness of breath EXAM: CT ANGIOGRAPHY CHEST WITH CONTRAST TECHNIQUE: Multidetector CT imaging of the chest was performed using the standard protocol during bolus administration of intravenous contrast. Multiplanar CT image reconstructions and MIPs were obtained to evaluate the vascular anatomy. CONTRAST:  43mL OMNIPAQUE IOHEXOL 350 MG/ML SOLN COMPARISON:  None. FINDINGS: Cardiovascular: Heart is normal size. Aorta is normal caliber. No filling defects in the pulmonary arteries to suggestpulmonary emboli. Mediastinum/Nodes: Soft tissue in the anterior mediastinum felt represent residual thymus. No mediastinal, hilar, or axillary adenopathy. Lungs/Pleura: Lungs are clear. No focal airspace opacities or suspicious nodules. No effusions. Upper Abdomen: Heterogeneous enhancement in the liver. Diffuse low-density. Findings likely  reflect fatty infiltration. Musculoskeletal: Chest wall soft tissues are unremarkable. No acute bony abnormality. Review of the MIP images confirms the above findings. IMPRESSION: No evidence of pulmonary embolus. No acute cardiopulmonary disease. Suspect fatty infiltration of the liver. Electronically Signed   By: Rolm Baptise M.D.   On: 09/03/2018 20:19   Dg Chest Portable 1 View  Result Date: 09/03/2018 CLINICAL DATA:  Pt. Here yesterday for Allergic reaction to something she reports. Pt. Said she broke out in hives on yesterday and came to ED with symptoms. Pt. Was seen here at Med center for Allergic reaction. Pt. Today having shortness o.*comment was truncated*Shortness of breath EXAM: PORTABLE CHEST 1 VIEW COMPARISON:  None. FINDINGS: Normal mediastinum and cardiac silhouette. Normal pulmonary vasculature. No evidence of effusion, infiltrate, or pneumothorax. No acute bony abnormality. IMPRESSION: Normal chest radiograph Electronically Signed   By: Suzy Bouchard M.D.   On: 09/03/2018 18:43    EKG: Independently reviewed.  It shows sinus tachycardia with a rate of 120.  No significant ST changes.  Assessment/Plan Principal Problem:   Allergic reaction Active Problems:   Anxiety   Asthma   Essential (primary) hypertension   Gastro-esophageal reflux disease without esophagitis   Obesity with body mass index 30 or greater   Anaphylaxis     #1 allergic reaction: Most likely due to chemical agents when patient had her facial.  She has history of atopy with pollen allergies as well as asthma.  Patient will be admitted and placed on IV Solu-Medrol, Benadryl and Pepcid.  Supportive care only.  Shortness of breath appears to have resolved now.  #2 hypertension: Continue home regimen as soon as confirmed.  #3 GERD: Continue with PPIs  #4 anxiety disorder: Resume home regimen including Prozac and trazodone.  #5 morbid obesity: Dietary counseling.  #6 history of asthma: No wheezing and  no evidence of asthma exacerbation at the moment  #7 hypokalemia: Probably from the nausea and vomiting.  Replete potassium  #8 leukocytosis: Most likely secondary to steroids   DVT prophylaxis: SCD Code Status: Full code Family Communication: Care discussed with patient fully Disposition Plan: Home Consults called: None Admission status: Observation  Severity of Illness: The appropriate patient status for this patient is OBSERVATION. Observation status is judged to be reasonable and necessary in  order to provide the required intensity of service to ensure the patient's safety. The patient's presenting symptoms, physical exam findings, and initial radiographic and laboratory data in the context of their medical condition is felt to place them at decreased risk for further clinical deterioration. Furthermore, it is anticipated that the patient will be medically stable for discharge from the hospital within 2 midnights of admission. The following factors support the patient status of observation.   " The patient's presenting symptoms include shortness of breath and diarrhea. " The physical exam findings include no significant wheezing. " The initial radiographic and laboratory data are leukocytosis and hypokalemia.     Lonia BloodGARBA,LAWAL MD [facility] Pager [phone]  If 7PM-7AM, please contact night-coverage [email or web address] Password [email or web address]  09/04/2018, 5:19 AM

## 2018-09-04 NOTE — Progress Notes (Signed)
Pt was given discharge instructions with no immediate questions or concerns.

## 2018-09-04 NOTE — Discharge Summary (Signed)
Physician Discharge Summary  Tanya Herrera XLK:[id number] DOB: March 07, 1986 DOA: 09/03/2018  PCP: Patient, No Pcp Per  Admit date: 09/03/2018 Discharge date: 09/04/2018  Admitted From: Home Disposition:  Home  Recommendations for Outpatient Follow-up:  1. Follow up with PCP in 1 week 2. Please obtain BMP/CBC in one week   Home Health: No Equipment/Devices: None  Discharge Condition: Stable CODE STATUS: Full Code Diet recommendation: Regular Diet  History of present illness:  Tanya Herrera is a 33 y.o. female with medical history significant of asthma, hypertension, PTSD, HHT, depression with anxiety, chronic insomnia who was seen at [facility] on Thursday with generalized itching facial swelling and hives.  Patient apparently had facial done for the first time the day before.  She subsequently started having significant itching all over with some shortness of breath as well as nausea vomiting and diarrhea a day later.  She was initially given prednisone and supportive care and patient was discharged home.  Patient returned to the ER on Friday with worsening symptoms.  She denied swallowing problems.  She has several loose stools.  Denied exposure to anything new and no new medications.  Patient still having significant itching all over the body with rashes.  She was suspected to have failed outpatient treatment and admitted to the hospital for close observation.  ED Course: Temperature is 98.1 blood pressure 149/89, pulse 137 respiratory rate of 24 oxygen sat 93% room air.  Chemistry appears to be within normal except potassium 3.4.  White count is 15.5.  The rapid COVID 19 test was negative at med center.  Chest x-ray showed no active findings, CT angiogram of the chest also showed no PE. Patient is therefore being admitted to the hospital with significant allergic reaction possible anaphylaxis.  Hospital course:  Allergic reaction Most likely due to chemical agents when  patient had her facial.  She has history of atopy with pollen allergies as well as asthma.  Patient was admitted and placed on IV Solu-Medrol, Benadryl and Pepcid with improvement of her symptoms.  Patient will be discharged home on a methylprednisone taper with Benadryl and Pepcid.    Shortness of breath Patient was complaining of shortness of breath on presentation, likely secondary to her high anxiety state coupled with her acute allergic reaction as above.  She has been oxygenating high percent on room air, afebrile.  CT angios PE study was negative for pulmonary embolism and no acute cardiopulmonary findings.  Chest x-ray was negative and COVID-19 test was negative.  Troponin less than 0.03.  Symptoms has resolved at time of discharge.  Hypertension Continue home regimen as soon as confirmed.  GERD: Continue with PPI  anxiety disorder: Resume home regimen including Prozac and trazodone.  morbid obesity:  BMI 36.15, dietary counseling.  Discussed with her about weight loss measures as this complicates all facets of care.  history of asthma: No wheezing and no evidence of asthma exacerbation.  Albuterol inhaler as needed.  hypokalemia: Probably from the nausea and vomiting.    Potassium was repleted and was 4.2 at time of discharge.  Nausea and vomiting have resolved.  Tolerating diet.  leukocytosis: Most likely secondary to steroids.  WBC count 15.5 on admission, trended down to 13.9.  Afebrile.  No concerning or signs of infectious etiology.  Discharge Diagnoses:  Active Problems:   Anxiety   Asthma   Essential (primary) hypertension   Gastro-esophageal reflux disease without esophagitis   Obesity with body mass index 30 or greater  Discharge Instructions  Discharge Instructions    Call MD for:  difficulty breathing, headache or visual disturbances   Complete by: As directed    Call MD for:  extreme fatigue   Complete by: As directed    Call MD for:  hives    Complete by: As directed    Call MD for:  persistant dizziness or light-headedness   Complete by: As directed    Call MD for:  persistant nausea and vomiting   Complete by: As directed    Call MD for:  severe uncontrolled pain   Complete by: As directed    Call MD for:  temperature >100.4   Complete by: As directed    Diet - low sodium heart healthy   Complete by: As directed    Increase activity slowly   Complete by: As directed      Allergies as of 09/04/2018      Reactions   Iron Shortness Of Breath   Iron Dextran Cough, Other (See Comments), Shortness Of Breath   Latex Hives, Other (See Comments), Rash   Banana Other (See Comments)   MOUTH ITCH, IRRITATED FEELING   Penicillins Hives   Did it involve swelling of the face/tongue/throat, SOB, or low BP? Yes Did it involve sudden or severe rash/hives, skin peeling, or any reaction on the inside of your mouth or nose? No Did you need to seek medical attention at a hospital or doctor's office? No When did it last happen? If all above answers are "NO", may proceed with cephalosporin use.      Medication List    STOP taking these medications   chlorpheniramine-HYDROcodone 10-8 MG/5ML Suer Commonly known as: Tussionex Pennkinetic ER   oseltamivir 75 MG capsule Commonly known as: TAMIFLU   predniSONE 20 MG tablet Commonly known as: DELTASONE     TAKE these medications   albuterol (2.5 MG/3ML) 0.083% nebulizer solution Commonly known as: PROVENTIL Take 3 mLs (2.5 mg total) by nebulization every 4 (four) hours as needed for wheezing or shortness of breath.   albuterol 108 (90 Base) MCG/ACT inhaler Commonly known as: VENTOLIN HFA Inhale 2 puffs into the lungs every 4 (four) hours as needed for wheezing or shortness of breath.   clonazePAM 1 MG tablet Commonly known as: KLONOPIN Take 1 mg by mouth at bedtime as needed for anxiety.   diphenhydrAMINE 25 mg capsule Commonly known as: BENADRYL Take 1 capsule (25 mg  total) by mouth every 6 (six) hours as needed for itching.   famotidine 20 MG tablet Commonly known as: PEPCID Take 1 tablet (20 mg total) by mouth 2 (two) times daily as needed (itching, rash).   FLUoxetine 40 MG capsule Commonly known as: PROZAC Take 80 mg by mouth daily.   methylPREDNISolone 4 MG tablet Commonly known as: Medrol Take 2 tablets (8 mg total) by mouth 3 (three) times daily for 2 days, THEN 2 tablets (8 mg total) 2 (two) times a day for 2 days, THEN 1 tablet (4 mg total) 2 (two) times a day for 2 days, THEN 1 tablet (4 mg total) daily for 1 day. Start taking on: September 04, 2018   traZODone 50 MG tablet Commonly known as: DESYREL Take 100 mg by mouth at bedtime.       Allergies  Allergen Reactions  . Iron Shortness Of Breath  . Iron Dextran Cough, Other (See Comments) and Shortness Of Breath  . Latex Hives, Other (See Comments) and Rash  . Banana Other (  See Comments)    MOUTH ITCH, IRRITATED FEELING  . Penicillins Hives    Did it involve swelling of the face/tongue/throat, SOB, or low BP? Yes Did it involve sudden or severe rash/hives, skin peeling, or any reaction on the inside of your mouth or nose? No Did you need to seek medical attention at a hospital or doctor's office? No When did it last happen? If all above answers are "NO", may proceed with cephalosporin use.     Consultations:  None   Procedures/Studies: Ct Angio Chest Pe W And/or Wo Contrast  Result Date: 09/03/2018 CLINICAL DATA:  Shortness of breath EXAM: CT ANGIOGRAPHY CHEST WITH CONTRAST TECHNIQUE: Multidetector CT imaging of the chest was performed using the standard protocol during bolus administration of intravenous contrast. Multiplanar CT image reconstructions and MIPs were obtained to evaluate the vascular anatomy. CONTRAST:  57mL OMNIPAQUE IOHEXOL 350 MG/ML SOLN COMPARISON:  None. FINDINGS: Cardiovascular: Heart is normal size. Aorta is normal caliber. No filling defects in the  pulmonary arteries to suggestpulmonary emboli. Mediastinum/Nodes: Soft tissue in the anterior mediastinum felt represent residual thymus. No mediastinal, hilar, or axillary adenopathy. Lungs/Pleura: Lungs are clear. No focal airspace opacities or suspicious nodules. No effusions. Upper Abdomen: Heterogeneous enhancement in the liver. Diffuse low-density. Findings likely reflect fatty infiltration. Musculoskeletal: Chest wall soft tissues are unremarkable. No acute bony abnormality. Review of the MIP images confirms the above findings. IMPRESSION: No evidence of pulmonary embolus. No acute cardiopulmonary disease. Suspect fatty infiltration of the liver. Electronically Signed   By: Charlett Nose M.D.   On: 09/03/2018 20:19   Dg Chest Portable 1 View  Result Date: 09/03/2018 CLINICAL DATA:  Pt. Here yesterday for Allergic reaction to something she reports. Pt. Said she broke out in hives on yesterday and came to ED with symptoms. Pt. Was seen here at Med center for Allergic reaction. Pt. Today having shortness o.*comment was truncated*Shortness of breath EXAM: PORTABLE CHEST 1 VIEW COMPARISON:  None. FINDINGS: Normal mediastinum and cardiac silhouette. Normal pulmonary vasculature. No evidence of effusion, infiltrate, or pneumothorax. No acute bony abnormality. IMPRESSION: Normal chest radiograph Electronically Signed   By: Genevive Bi M.D.   On: 09/03/2018 18:43       Subjective: Patient seen and examined at bedside, resting comfortably sleeping.  Hypersensitive when people into the room as she has high anxiety.  States shortness of breath and redness to face have resolved.  Ready for discharge home.  No other complaints at this time.  Denies headache, no fever/chills/night sweats, no nausea/vomiting/diarrhea, no chest pain, no palpitations, no shortness of breath, no abdominal pain, no weakness, no cough/congestion, no paresthesias.  No acute events overnight per nursing staff.  Discharge  Exam: Vitals:   09/04/18 0159 09/04/18 0517  BP: 118/72 124/89  Pulse: 95 80  Resp: 20 18  Temp: 98.1 F (36.7 C) 98.3 F (36.8 C)  SpO2: 95% 100%   Vitals:   09/03/18 2055 09/03/18 2313 09/04/18 0159 09/04/18 0517  BP:  (!) 142/78 118/72 124/89  Pulse:  95 95 80  Resp: 20 (!) Temp:   98.1 F (36.7 C) 98.3 F (36.8 C)  TempSrc:   Oral Oral  SpO2:  93% 95% 100%  Weight:      Height:        General: Pt is alert, awake, not in acute distress Cardiovascular: RRR, S1/S2 +, no rubs, no gallops Respiratory: CTA bilaterally, no wheezing, no rhonchi Abdominal: Soft, NT, ND, bowel sounds +  Extremities: no edema, no cyanosis    The results of significant diagnostics from this hospitalization (including imaging, microbiology, ancillary and laboratory) are listed below for reference.     Microbiology: Recent Results (from the past 240 hour(s))  SARS Coronavirus 2 (Hosp order,Performed [facility] via Abbott ID)     Status: None   Collection Time: 09/03/18  6:08 PM   Specimen: Dry Nasal Swab (Abbott ID Now)  Result Value Ref Range Status   SARS Coronavirus 2 (Abbott ID Now) NEGATIVE NEGATIVE Final    Comment: (NOTE) Interpretive Result Comment(s): COVID 19 Positive SARS CoV 2 target nucleic acids are DETECTED. The SARS CoV 2 RNA is generally detectable in upper and lower respiratory specimens during the acute phase of infection.  Positive results are indicative of active infection with SARS CoV 2.  Clinical correlation with patient history and other diagnostic information is necessary to determine patient infection status.  Positive results do not rule out bacterial infection or coinfection with other viruses. The expected result is Negative. COVID 19 Negative SARS CoV 2 target nucleic acids are NOT DETECTED. The SARS CoV 2 RNA is generally detectable in upper and lower respiratory specimens during the acute phase of infection.  Negative results do not  preclude SARS CoV 2 infection, do not rule out coinfections with other pathogens, and should not be used as the sole basis for treatment or other patient management decisions.  Negative results must be combined with clinical  observations, patient history, and epidemiological information. The expected result is Negative. Invalid Presence or absence of SARS CoV 2 nucleic acids cannot be determined. Repeat testing was performed on the submitted specimen and repeated Invalid results were obtained.  If clinically indicated, additional testing on a new specimen with an alternate test methodology [phone]) is advised.  The SARS CoV 2 RNA is generally detectable in upper and lower respiratory specimens during the acute phase of infection. The expected result is Negative. Fact Sheet for Patients:  http://www.graves-ford.org/https://www.fda.gov/media/136524/download Fact Sheet for Healthcare Providers: EnviroConcern.sihttps://www.fda.gov/media/136523/download This test is not yet approved or cleared by the [place] FDA and has been authorized for detection and/or diagnosis of SARS CoV 2 by FDA under an Emergency Use Authorization (EUA).  This EUA will remain in effect (meaning this test can be used) for the duration of the COVID19 d eclaration under Section 564(b)(1) of the Act, 21 U.S.C. section [id number] 3(b)(1), unless the authorization is terminated or revoked sooner. Performed at [facility] [place], [address] [place], [place] [address]   SARS Coronavirus 2 (CEPHEID - Performed in [facility] hospital lab), Hosp Order     Status: None   Collection Time: 09/04/18  3:56 AM   Specimen: Nasopharyngeal Swab  Result Value Ref Range Status   SARS Coronavirus 2 NEGATIVE NEGATIVE Final    Comment: (NOTE) If result is NEGATIVE SARS-CoV-2 target nucleic acids are NOT DETECTED. The SARS-CoV-2 RNA is generally detectable in upper and lower  respiratory specimens during the acute phase of infection. The lowest   concentration of SARS-CoV-2 viral copies this assay can detect is 250  copies / mL. A negative result does not preclude SARS-CoV-2 infection  and should not be used as the sole basis for treatment or other  patient management decisions.  A negative result may occur with  improper specimen collection / handling, submission of specimen other  than nasopharyngeal swab, presence of viral mutation(s) within the  areas targeted by this assay, and inadequate number  of viral copies  (<250 copies / mL). A negative result must be combined with clinical  observations, patient history, and epidemiological information. If result is POSITIVE SARS-CoV-2 target nucleic acids are DETECTED. The SARS-CoV-2 RNA is generally detectable in upper and lower  respiratory specimens dur ing the acute phase of infection.  Positive  results are indicative of active infection with SARS-CoV-2.  Clinical  correlation with patient history and other diagnostic information is  necessary to determine patient infection status.  Positive results do  not rule out bacterial infection or co-infection with other viruses. If result is PRESUMPTIVE POSTIVE SARS-CoV-2 nucleic acids MAY BE PRESENT.   A presumptive positive result was obtained on the submitted specimen  and confirmed on repeat testing.  While 2019 novel coronavirus  (SARS-CoV-2) nucleic acids may be present in the submitted sample  additional confirmatory testing may be necessary for epidemiological  and / or clinical management purposes  to differentiate between  SARS-CoV-2 and other Sarbecovirus currently known to infect humans.  If clinically indicated additional testing with an alternate test  methodology [phone]) is advised. The SARS-CoV-2 RNA is generally  detectable in upper and lower respiratory sp ecimens during the acute  phase of infection. The expected result is Negative. Fact Sheet for Patients:  BoilerBrush.com.cy Fact Sheet  for Healthcare Providers: https://pope.com/ This test is not yet approved or cleared by the [place] FDA and has been authorized for detection and/or diagnosis of SARS-CoV-2 by FDA under an Emergency Use Authorization (EUA).  This EUA will remain in effect (meaning this test can be used) for the duration of the COVID-19 declaration under Section 564(b)(1) of the Act, 21 U.S.C. section 360bbb-3(b)(1), unless the authorization is terminated or revoked sooner. Performed at [facility], [address], [place], [place] [address]      Labs: BNP (last 3 results) No results for input(s): BNP in the last 8760 hours. Basic Metabolic Panel: Recent Labs  Lab 09/03/18 1803 09/04/18 0511  NA 138 138  K 3.4* 4.2  CL 108 108  CO2 20* 23  GLUCOSE 119* 180*  BUN 13 11  CREATININE 0.72 0.76  CALCIUM 9.5 9.1   Liver Function Tests: Recent Labs  Lab 09/03/18 1803 09/04/18 0511  AST 41 33  ALT 48* 42  ALKPHOS 70 69  BILITOT 0.7 0.4  PROT 8.5* 8.0  ALBUMIN 4.6 4.3   No results for input(s): LIPASE, AMYLASE in the last 168 hours. No results for input(s): AMMONIA in the last 168 hours. CBC: Recent Labs  Lab 09/03/18 1803 09/04/18 0511  WBC 15.5* 13.9*  NEUTROABS 12.2*  --   HGB 14.8 13.9  HCT 43.9 42.1  MCV 87.1 88.8  PLT 293 290   Cardiac Enzymes: Recent Labs  Lab 09/03/18 1803  TROPONINI <0.03   BNP: Invalid input(s): POCBNP CBG: No results for input(s): GLUCAP in the last 168 hours. D-Dimer No results for input(s): DDIMER in the last 72 hours. Hgb A1c No results for input(s): HGBA1C in the last 72 hours. Lipid Profile No results for input(s): CHOL, HDL, LDLCALC, TRIG, CHOLHDL, LDLDIRECT in the last 72 hours. Thyroid function studies No results for input(s): TSH, T4TOTAL, T3FREE, THYROIDAB in the last 72 hours.  Invalid input(s): FREET3 Anemia work up No results for input(s): VITAMINB12, FOLATE, FERRITIN, TIBC,  IRON, RETICCTPCT in the last 72 hours. Urinalysis    Component Value Date/Time   COLORURINE YELLOW 07/30/2017 1743   APPEARANCEUR CLEAR 07/30/2017 1743   LABSPEC >1.030 (H)  07/30/2017 1743   PHURINE 6.0 07/30/2017 1743   GLUCOSEU NEGATIVE 07/30/2017 1743   HGBUR NEGATIVE 07/30/2017 1743   BILIRUBINUR NEGATIVE 07/30/2017 1743   KETONESUR NEGATIVE 07/30/2017 1743   PROTEINUR NEGATIVE 07/30/2017 1743   NITRITE NEGATIVE 07/30/2017 1743   LEUKOCYTESUR NEGATIVE 07/30/2017 1743   Sepsis Labs Invalid input(s): PROCALCITONIN,  WBC,  LACTICIDVEN Microbiology Recent Results (from the past 240 hour(s))  SARS Coronavirus 2 (Hosp order,Performed in [facility] lab via Abbott ID)     Status: None   Collection Time: 09/03/18  6:08 PM   Specimen: Dry Nasal Swab (Abbott ID Now)  Result Value Ref Range Status   SARS Coronavirus 2 (Abbott ID Now) NEGATIVE NEGATIVE Final    Comment: (NOTE) Interpretive Result Comment(s): COVID 19 Positive SARS CoV 2 target nucleic acids are DETECTED. The SARS CoV 2 RNA is generally detectable in upper and lower respiratory specimens during the acute phase of infection.  Positive results are indicative of active infection with SARS CoV 2.  Clinical correlation with patient history and other diagnostic information is necessary to determine patient infection status.  Positive results do not rule out bacterial infection or coinfection with other viruses. The expected result is Negative. COVID 19 Negative SARS CoV 2 target nucleic acids are NOT DETECTED. The SARS CoV 2 RNA is generally detectable in upper and lower respiratory specimens during the acute phase of infection.  Negative results do not preclude SARS CoV 2 infection, do not rule out coinfections with other pathogens, and should not be used as the sole basis for treatment or other patient management decisions.  Negative results must be combined with clinical  observations, patient history, and  epidemiological information. The expected result is Negative. Invalid Presence or absence of SARS CoV 2 nucleic acids cannot be determined. Repeat testing was performed on the submitted specimen and repeated Invalid results were obtained.  If clinically indicated, additional testing on a new specimen with an alternate test methodology [phone]) is advised.  The SARS CoV 2 RNA is generally detectable in upper and lower respiratory specimens during the acute phase of infection. The expected result is Negative. Fact Sheet for Patients:  http://www.graves-ford.org/https://www.fda.gov/media/136524/download Fact Sheet for Healthcare Providers: EnviroConcern.sihttps://www.fda.gov/media/136523/download This test is not yet approved or cleared by the [place] FDA and has been authorized for detection and/or diagnosis of SARS CoV 2 by FDA under an Emergency Use Authorization (EUA).  This EUA will remain in effect (meaning this test can be used) for the duration of the COVID19 d eclaration under Section 564(b)(1) of the Act, 21 U.S.C. section [id number] 3(b)(1), unless the authorization is terminated or revoked sooner. Performed at [facility] [place], [address] [place], [place] [address]   SARS Coronavirus 2 (CEPHEID - Performed in [facility] hospital lab), Hosp Order     Status: None   Collection Time: 09/04/18  3:56 AM   Specimen: Nasopharyngeal Swab  Result Value Ref Range Status   SARS Coronavirus 2 NEGATIVE NEGATIVE Final    Comment: (NOTE) If result is NEGATIVE SARS-CoV-2 target nucleic acids are NOT DETECTED. The SARS-CoV-2 RNA is generally detectable in upper and lower  respiratory specimens during the acute phase of infection. The lowest  concentration of SARS-CoV-2 viral copies this assay can detect is 250  copies / mL. A negative result does not preclude SARS-CoV-2 infection  and should not be used as the sole basis for treatment or other  patient management decisions.  A negative result may occur with  improper specimen collection / handling, submission of specimen other  than nasopharyngeal swab, presence of viral mutation(s) within the  areas targeted by this assay, and inadequate number of viral copies  (<250 copies / mL). A negative result must be combined with clinical  observations, patient history, and epidemiological information. If result is POSITIVE SARS-CoV-2 target nucleic acids are DETECTED. The SARS-CoV-2 RNA is generally detectable in upper and lower  respiratory specimens dur ing the acute phase of infection.  Positive  results are indicative of active infection with SARS-CoV-2.  Clinical  correlation with patient history and other diagnostic information is  necessary to determine patient infection status.  Positive results do  not rule out bacterial infection or co-infection with other viruses. If result is PRESUMPTIVE POSTIVE SARS-CoV-2 nucleic acids MAY BE PRESENT.   A presumptive positive result was obtained on the submitted specimen  and confirmed on repeat testing.  While 2019 novel coronavirus  (SARS-CoV-2) nucleic acids may be present in the submitted sample  additional confirmatory testing may be necessary for epidemiological  and / or clinical management purposes  to differentiate between  SARS-CoV-2 and other Sarbecovirus currently known to infect humans.  If clinically indicated additional testing with an alternate test  methodology [phone]) is advised. The SARS-CoV-2 RNA is generally  detectable in upper and lower respiratory sp ecimens during the acute  phase of infection. The expected result is Negative. Fact Sheet for Patients:  BoilerBrush.com.cy Fact Sheet for Healthcare Providers: https://pope.com/ This test is not yet approved or cleared by the [place] FDA and has been authorized for detection and/or diagnosis of SARS-CoV-2 by FDA under an Emergency Use Authorization (EUA).  This EUA will  remain in effect (meaning this test can be used) for the duration of the COVID-19 declaration under Section 564(b)(1) of the Act, 21 U.S.C. section 360bbb-3(b)(1), unless the authorization is terminated or revoked sooner. Performed at [facility], [address] [place], [place] [address]      Time coordinating discharge: Over 30 minutes  SIGNED:   Alvira Philips [place], DO  [facility] 09/04/2018, 10:00 AM

## 2018-09-04 NOTE — Progress Notes (Signed)
Paged Dr. Jonelle Sidle for COVID testing order for admission testing. ICU nurse came to swab patient for 2nd COVID test. Sent to lab. Received call from lab, specimen had no saline, so inaccurate. Lab called back and stated that they can add the saline and use the current specimen. [facility] COVID test currently pending. Will continue to monitor.

## 2019-02-19 ENCOUNTER — Encounter (HOSPITAL_BASED_OUTPATIENT_CLINIC_OR_DEPARTMENT_OTHER): Payer: Self-pay | Admitting: *Deleted

## 2019-02-19 ENCOUNTER — Other Ambulatory Visit: Payer: Self-pay

## 2019-02-19 ENCOUNTER — Emergency Department (HOSPITAL_BASED_OUTPATIENT_CLINIC_OR_DEPARTMENT_OTHER): Payer: Medicaid Other

## 2019-02-19 ENCOUNTER — Emergency Department (HOSPITAL_BASED_OUTPATIENT_CLINIC_OR_DEPARTMENT_OTHER)
Admission: EM | Admit: 2019-02-19 | Discharge: 2019-02-19 | Disposition: A | Discharge status: Home / Self Care | Payer: Medicaid Other | Attending: Emergency Medicine | Admitting: Emergency Medicine

## 2019-02-19 DIAGNOSIS — Z91040 Latex allergy status: Secondary | ICD-10-CM | POA: Diagnosis not present

## 2019-02-19 DIAGNOSIS — G43909 Migraine, unspecified, not intractable, without status migrainosus: Secondary | ICD-10-CM | POA: Diagnosis not present

## 2019-02-19 DIAGNOSIS — J45909 Unspecified asthma, uncomplicated: Secondary | ICD-10-CM | POA: Diagnosis not present

## 2019-02-19 DIAGNOSIS — R519 Headache, unspecified: Secondary | ICD-10-CM | POA: Diagnosis present

## 2019-02-19 DIAGNOSIS — I10 Essential (primary) hypertension: Secondary | ICD-10-CM | POA: Insufficient documentation

## 2019-02-19 DIAGNOSIS — Z79899 Other long term (current) drug therapy: Secondary | ICD-10-CM | POA: Insufficient documentation

## 2019-02-19 MED ORDER — DIPHENHYDRAMINE HCL 50 MG/ML IJ SOLN
25.0000 mg | Freq: Once | INTRAMUSCULAR | Status: AC
  Administered 2019-02-19: 25 mg via INTRAVENOUS
  Filled 2019-02-19: qty 1

## 2019-02-19 MED ORDER — METOCLOPRAMIDE HCL 5 MG/ML IJ SOLN
10.0000 mg | Freq: Once | INTRAMUSCULAR | Status: AC
  Administered 2019-02-19: 10 mg via INTRAVENOUS
  Filled 2019-02-19: qty 2

## 2019-02-19 MED ORDER — DEXAMETHASONE SODIUM PHOSPHATE 10 MG/ML IJ SOLN
10.0000 mg | Freq: Once | INTRAMUSCULAR | Status: AC
  Administered 2019-02-19: 10 mg via INTRAVENOUS
  Filled 2019-02-19: qty 1

## 2019-02-19 NOTE — ED Notes (Signed)
Pt ambulated with no difficulty

## 2019-02-19 NOTE — ED Notes (Signed)
Pt transported to CT at this time.

## 2019-02-19 NOTE — ED Triage Notes (Signed)
Pt reports she had a head injury in June and was dx with post-concussive syndrome. States her headache has been worse over the last 3 days. States she vomited earlier, c/o nausea. States that her head hurts "on the inside and outside"
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
## 2019-08-21 ENCOUNTER — Emergency Department (HOSPITAL_BASED_OUTPATIENT_CLINIC_OR_DEPARTMENT_OTHER)
Admission: EM | Admit: 2019-08-21 | Discharge: 2019-08-21 | Disposition: A | Discharge status: Home / Self Care | Payer: Medicaid Other | Attending: Emergency Medicine | Admitting: Emergency Medicine

## 2019-08-21 ENCOUNTER — Encounter (HOSPITAL_BASED_OUTPATIENT_CLINIC_OR_DEPARTMENT_OTHER): Payer: Self-pay | Admitting: Emergency Medicine

## 2019-08-21 ENCOUNTER — Other Ambulatory Visit: Payer: Self-pay

## 2019-08-21 DIAGNOSIS — R112 Nausea with vomiting, unspecified: Secondary | ICD-10-CM | POA: Insufficient documentation

## 2019-08-21 DIAGNOSIS — R197 Diarrhea, unspecified: Secondary | ICD-10-CM | POA: Insufficient documentation

## 2019-08-21 DIAGNOSIS — I10 Essential (primary) hypertension: Secondary | ICD-10-CM | POA: Insufficient documentation

## 2019-08-21 DIAGNOSIS — Z888 Allergy status to other drugs, medicaments and biological substances status: Secondary | ICD-10-CM | POA: Diagnosis not present

## 2019-08-21 DIAGNOSIS — Z91040 Latex allergy status: Secondary | ICD-10-CM | POA: Insufficient documentation

## 2019-08-21 DIAGNOSIS — K5900 Constipation, unspecified: Secondary | ICD-10-CM | POA: Diagnosis not present

## 2019-08-21 DIAGNOSIS — R1013 Epigastric pain: Secondary | ICD-10-CM | POA: Insufficient documentation

## 2019-08-21 DIAGNOSIS — Z880 Allergy status to penicillin: Secondary | ICD-10-CM | POA: Insufficient documentation

## 2019-08-21 DIAGNOSIS — J45909 Unspecified asthma, uncomplicated: Secondary | ICD-10-CM | POA: Diagnosis not present

## 2019-08-21 DIAGNOSIS — Z91018 Allergy to other foods: Secondary | ICD-10-CM | POA: Diagnosis not present

## 2019-08-21 DIAGNOSIS — Z79899 Other long term (current) drug therapy: Secondary | ICD-10-CM | POA: Diagnosis not present

## 2019-08-21 LAB — CBC WITH DIFFERENTIAL/PLATELET
Abs Immature Granulocytes: 0.02 10*3/uL (ref 0.00–0.07)
Basophils Absolute: 0 10*3/uL (ref 0.0–0.1)
Basophils Relative: 0 %
Eosinophils Absolute: 0.3 10*3/uL (ref 0.0–0.5)
Eosinophils Relative: 4 %
HCT: 42.7 % (ref 36.0–46.0)
Hemoglobin: 15.7 g/dL — ABNORMAL HIGH (ref 12.0–15.0)
Immature Granulocytes: 0 %
Lymphocytes Relative: 15 %
Lymphs Abs: 1.2 10*3/uL (ref 0.7–4.0)
MCH: 30.1 pg (ref 26.0–34.0)
MCHC: 36.8 g/dL — ABNORMAL HIGH (ref 30.0–36.0)
MCV: 82 fL (ref 80.0–100.0)
Monocytes Absolute: 0.4 10*3/uL (ref 0.1–1.0)
Monocytes Relative: 5 %
Neutro Abs: 6.2 10*3/uL (ref 1.7–7.7)
Neutrophils Relative %: 76 %
Platelets: 322 10*3/uL (ref 150–400)
RBC: 5.21 MIL/uL — ABNORMAL HIGH (ref 3.87–5.11)
RDW: 13.5 % (ref 11.5–15.5)
WBC: 8.1 10*3/uL (ref 4.0–10.5)
nRBC: 0 % (ref 0.0–0.2)

## 2019-08-21 LAB — COMPREHENSIVE METABOLIC PANEL
ALT: 25 U/L (ref 0–44)
AST: 29 U/L (ref 15–41)
Albumin: 4.5 g/dL (ref 3.5–5.0)
Alkaline Phosphatase: 75 U/L (ref 38–126)
Anion gap: 11 (ref 5–15)
BUN: 13 mg/dL (ref 6–20)
CO2: 23 mmol/L (ref 22–32)
Calcium: 8.9 mg/dL (ref 8.9–10.3)
Chloride: 100 mmol/L (ref 98–111)
Creatinine, Ser: 0.67 mg/dL (ref 0.44–1.00)
GFR calc Af Amer: >60 mL/min (ref >60)
GFR calc non Af Amer: >60 mL/min (ref >60)
Glucose, Bld: 102 mg/dL — ABNORMAL HIGH (ref 70–99)
Potassium: 3.7 mmol/L (ref 3.5–5.1)
Sodium: 134 mmol/L — ABNORMAL LOW (ref 135–145)
Total Bilirubin: 1.2 mg/dL (ref 0.3–1.2)
Total Protein: 8.1 g/dL (ref 6.5–8.1)

## 2019-08-21 LAB — URINALYSIS, ROUTINE W REFLEX MICROSCOPIC
Bilirubin Urine: NEGATIVE
Glucose, UA: NEGATIVE mg/dL
Hgb urine dipstick: NEGATIVE
Ketones, ur: 15 mg/dL — AB
Leukocytes,Ua: NEGATIVE
Nitrite: NEGATIVE
Protein, ur: NEGATIVE mg/dL
Specific Gravity, Urine: 1.01 (ref 1.005–1.030)
pH: 7.5 (ref 5.0–8.0)

## 2019-08-21 LAB — LIPASE, BLOOD: Lipase: 21 U/L (ref 11–51)

## 2019-08-21 MED ORDER — SODIUM CHLORIDE 0.9 % IV BOLUS
1000.0000 mL | Freq: Once | INTRAVENOUS | Status: AC
  Administered 2019-08-21: 1000 mL via INTRAVENOUS

## 2019-08-21 MED ORDER — PANTOPRAZOLE SODIUM 20 MG PO TBEC: 20.0000 mg | DELAYED_RELEASE_TABLET | Freq: Every day | ORAL | 1 refills | Status: DC

## 2019-08-21 MED ORDER — FAMOTIDINE 20 MG PO TABS: 20.0000 mg | ORAL_TABLET | Freq: Two times a day (BID) | ORAL | 0 refills | Status: DC

## 2019-08-21 MED ORDER — FAMOTIDINE 20 MG PO TABS
20.0000 mg | ORAL_TABLET | Freq: Once | ORAL | Status: AC
  Administered 2019-08-21: 20 mg via ORAL
  Filled 2019-08-21: qty 1

## 2019-08-21 MED ORDER — ALUM & MAG HYDROXIDE-SIMETH 200-200-20 MG/5ML PO SUSP
30.0000 mL | Freq: Once | ORAL | Status: AC
  Administered 2019-08-21: 30 mL via ORAL
  Filled 2019-08-21: qty 30

## 2019-08-21 MED ORDER — SODIUM CHLORIDE 0.9 % IV SOLN: INTRAVENOUS | Status: DC

## 2019-08-21 MED ORDER — PANTOPRAZOLE SODIUM 40 MG PO TBEC
40.0000 mg | DELAYED_RELEASE_TABLET | Freq: Once | ORAL | Status: AC
  Administered 2019-08-21: 40 mg via ORAL
  Filled 2019-08-21: qty 1

## 2019-08-21 MED ORDER — ONDANSETRON HCL 4 MG/2ML IJ SOLN
4.0000 mg | Freq: Once | INTRAMUSCULAR | Status: AC
  Administered 2019-08-21: 4 mg via INTRAVENOUS
  Filled 2019-08-21: qty 2

## 2019-08-21 MED ORDER — ONDANSETRON 4 MG PO TBDP: 4.0000 mg | ORAL_TABLET | Freq: Three times a day (TID) | ORAL | 0 refills | Status: DC | PRN

## 2019-08-21 MED ORDER — LIDOCAINE VISCOUS HCL 2 % MT SOLN
15.0000 mL | Freq: Once | OROMUCOSAL | Status: AC
  Administered 2019-08-21: 15 mL via ORAL
  Filled 2019-08-21: qty 15

## 2019-08-21 MED ORDER — SUCRALFATE 1 G PO TABS: 1.0000 g | ORAL_TABLET | Freq: Three times a day (TID) | ORAL | 0 refills | Status: DC

## 2019-08-21 NOTE — ED Notes (Signed)
Pt ambulatory with steady gait to restroom to provide urine specimen 

## 2019-08-21 NOTE — ED Triage Notes (Signed)
Generalized abdominal pain x 1 week. N/V x 2 days. Denies diarrhea.

## 2019-08-21 NOTE — ED Provider Notes (Signed)
[facility] EMERGENCY DEPARTMENT Provider Note   CSN: [id number] Arrival date & time: 08/21/19  4742     History Chief Complaint  Patient presents with  . Abdominal Pain  . Emesis    Tanya Herrera is a 34 y.o. female.  Patient with history of hysterectomy, abdominal wall surgery --presents to the emergency department for complaint of upper abdominal pain, nausea and vomiting.  Patient reports that approximately 1 week ago she developed pain in her upper abdomen in the midline and slightly to the left.  It was more mild.  Approximately 2 days ago she developed persistent nausea and vomiting, especially after eating and drinking.  Pain does not radiate.  She denies any fevers, chest pain, shortness of breath.  She denies any urinary symptoms or vaginal bleeding or discharge.  When asked about constipation or diarrhea, she states "a little bit of both".  She denies seeing blood in her stool but admits that she has not looked.  She has not seen any blood in her vomit.  No treatments prior to arrival other than Pepto-Bismol which has not really helped very much.  She still has her gallbladder and appendix.  She reports use of Aleve 3-4 times a week for headaches.  She drinks alcohol on occasion, last use several days ago and was heavy.  She does not drink every day.  She does state that her symptoms worsened after recent episode of drinking.        Past Medical History:  Diagnosis Date  . Asthma   . Family history of adverse reaction to anesthesia   . H/O transfusion of packed red blood cells   . Hemophilia (HCC)   . HHT (hereditary hemorrhagic telangiectasia) (HCC)   . Hypertension   . Insomnia   . Major depressive disorder   . Panic disorder   . PTSD (post-traumatic stress disorder)     Patient Active Problem List   Diagnosis Date Noted  . MDD (major depressive disorder), severe (HCC) 04/09/2018  . Asthma 01/25/2015  . Gastro-esophageal reflux disease without  esophagitis 01/25/2015  . Obesity with body mass index 30 or greater 08/04/2014  . Allergic rhinitis due to pollen 06/23/2014  . Anxiety 07/27/2013  . Essential (primary) hypertension 07/27/2013  . Anemia 06/28/2013  . Major depressive disorder, single episode, unspecified 11/26/2011  . Migraine without aura 07/16/2011    Past Surgical History:  Procedure Laterality Date  . ABDOMINAL HYSTERECTOMY    . ABDOMINAL SURGERY    . CESAREAN SECTION     x 3  . DILATION AND CURETTAGE OF UTERUS    . ENDOMETRIAL ABLATION    . NOSE SURGERY    . TUBAL LIGATION       OB History   No obstetric history on file.     History reviewed. No pertinent family history.  Social History   Tobacco Use  . Smoking status: Never Smoker  . Smokeless tobacco: Never Used  Substance Use Topics  . Alcohol use: Not Currently    Comment: occassionally  . Drug use: No    Home Medications Prior to Admission medications   Medication Sig Start Date End Date Taking? Authorizing Provider  albuterol (PROVENTIL HFA;VENTOLIN HFA) 108 (90 Base) MCG/ACT inhaler Inhale 2 puffs into the lungs every 4 (four) hours as needed for wheezing or shortness of breath. 04/19/17   Doug Sou, MD  albuterol (PROVENTIL) (2.5 MG/3ML) 0.083% nebulizer solution Take 3 mLs (2.5 mg total) by nebulization every 4 (four)  hours as needed for wheezing or shortness of breath. 04/19/17   Doug Sou, MD  clonazePAM (KLONOPIN) 1 MG tablet Take 1 mg by mouth at bedtime as needed for anxiety.  01/04/18   [provider]  diphenhydrAMINE (BENADRYL) 25 mg capsule Take 1 capsule (25 mg total) by mouth every 6 (six) hours as needed for itching. 09/04/18   [place], Alvira Philips, DO  famotidine (PEPCID) 20 MG tablet Take 1 tablet (20 mg total) by mouth 2 (two) times daily as needed (itching, rash). 09/02/18   Ward, Chase Picket, PA-C  FLUoxetine (PROZAC) 40 MG capsule Take 80 mg by mouth daily.  03/23/18   [provider]  traZODone  (DESYREL) 50 MG tablet Take 100 mg by mouth at bedtime.  03/25/18   [provider]    Allergies    Iron, Iron dextran, Latex, Banana, and Penicillins  Review of Systems   Review of Systems  Constitutional: Negative for fever.  HENT: Negative for rhinorrhea and sore throat.   Eyes: Negative for redness.  Respiratory: Negative for cough and shortness of breath.   Cardiovascular: Negative for chest pain.  Gastrointestinal: Positive for abdominal pain, constipation, diarrhea, nausea and vomiting. Negative for blood in stool.  Genitourinary: Negative for dysuria, frequency, hematuria, vaginal bleeding and vaginal discharge.  Musculoskeletal: Negative for myalgias.  Skin: Negative for rash.  Neurological: Negative for headaches.    Physical Exam Updated Vital Signs BP (!) 163/113 (BP Location: Right Arm)   Pulse 85   Temp 98 F (36.7 C)   Resp 20   LMP 10/19/2015   SpO2 100%   Physical Exam Vitals and nursing note reviewed.  Constitutional:      Appearance: She is well-developed.  HENT:     Head: Normocephalic and atraumatic.  Eyes:     General:        Right eye: No discharge.        Left eye: No discharge.     Conjunctiva/sclera: Conjunctivae normal.  Cardiovascular:     Rate and Rhythm: Normal rate and regular rhythm.     Heart sounds: Normal heart sounds.  Pulmonary:     Effort: Pulmonary effort is normal.     Breath sounds: Normal breath sounds.  Abdominal:     Palpations: Abdomen is soft.     Tenderness: There is abdominal tenderness in the epigastric area and left upper quadrant. There is no guarding or rebound. Negative signs include Murphy's sign and McBurney's sign.  Musculoskeletal:     Cervical back: Normal range of motion and neck supple.  Skin:    General: Skin is warm and dry.  Neurological:     Mental Status: She is alert.     ED Results / Procedures / Treatments   Labs (all labs ordered are listed, but only abnormal results are  displayed) Labs Reviewed  COMPREHENSIVE METABOLIC PANEL - Abnormal; Notable for the following components:      Result Value   Sodium 134 (*)    Glucose, Bld 102 (*)    All other components within normal limits  CBC WITH DIFFERENTIAL/PLATELET - Abnormal; Notable for the following components:   RBC 5.21 (*)    Hemoglobin 15.7 (*)    MCHC 36.8 (*)    All other components within normal limits  URINALYSIS, ROUTINE W REFLEX MICROSCOPIC - Abnormal; Notable for the following components:   Ketones, ur 15 (*)    All other components within normal limits  LIPASE, BLOOD  EKG None  Radiology No results found.  Procedures Procedures (including critical care time)  Medications Ordered in ED Medications  sodium chloride 0.9 % bolus 1,000 mL (0 mLs Intravenous Stopped 08/21/19 1234)    And  0.9 %  sodium chloride infusion (has no administration in time range)  ondansetron (ZOFRAN) injection 4 mg (4 mg Intravenous Given 08/21/19 1052)  alum & mag hydroxide-simeth (MAALOX/MYLANTA) 200-200-20 MG/5ML suspension 30 mL (30 mLs Oral Given 08/21/19 1054)    And  lidocaine (XYLOCAINE) 2 % viscous mouth solution 15 mL (15 mLs Oral Given 08/21/19 1054)  famotidine (PEPCID) tablet 20 mg (20 mg Oral Given 08/21/19 1235)  pantoprazole (PROTONIX) EC tablet 40 mg (40 mg Oral Given 08/21/19 1235)    ED Course  I have reviewed the triage vital signs and the nursing notes.  Pertinent labs & imaging results that were available during my care of the patient were reviewed by me and considered in my medical decision making (see chart for details).  Patient seen and examined. Work-up initiated. Medications ordered.   Vital signs reviewed and are as follows: BP (!) 163/113 (BP Location: Right Arm)   Pulse 85   Temp 98 F (36.7 C)   Resp 20   LMP 10/19/2015   SpO2 100%   Patient did have some improvement with GI cocktail however pain later returned.  Her work-up is largely reassuring.  Slight elevation in  hematocrit possibly signifying an element of dehydration.  Patient has been treated with IV fluids.  She was given a dose of Protonix as well as Pepcid prior to discharge.  She will also be given prescription for Carafate and Zofran.  She was counseled on brat diet and need to follow-up with her primary care doctor for recheck in the next 3 days.  The patient was urged to return to the Emergency Department immediately with worsening of current symptoms, worsening abdominal pain, persistent vomiting, blood noted in stools, fever, or any other concerns. The patient verbalized understanding.      MDM Rules/Calculators/A&P                      Patient with abdominal pain, epigastric.  Suspect gastritis/PUD.  She does not have focal right upper quadrant pain to suggest cholecystitis.  She does not have any chest pain or shortness of breath to suggest lower lung etiology.  Vitals are stable, no fever. Labs with normal white count, normal liver, kidney, pancreas testing. Imaging not felt indicated at this time. No signs of dehydration, patient is tolerating PO's. Lungs are clear and no signs suggestive of PNA. Low concern for appendicitis, cholecystitis, pancreatitis, ruptured viscus, UTI, kidney stone, aortic dissection, aortic aneurysm or other emergent abdominal etiology.  Will begin empiric treatment for gastritis.  Patient to avoid NSAIDs, alcohol.  She does have a recent history of NSAID and alcohol use.  Supportive therapy indicated with return if symptoms worsen.     Final Clinical Impression(s) / ED Diagnoses Final diagnoses:  Epigastric pain    Rx / DC Orders ED Discharge Orders         Ordered    pantoprazole (PROTONIX) 20 MG tablet  Daily     08/21/19 1249    famotidine (PEPCID) 20 MG tablet  2 times daily     08/21/19 1249    sucralfate (CARAFATE) 1 g tablet  3 times daily with meals & bedtime     08/21/19 1249    ondansetron (ZOFRAN ODT)  4 MG disintegrating tablet  Every 8 hours  PRN     08/21/19 1249           Carlisle Cater, PA-C 08/21/19 1256    Elnora Morrison, MD 08/28/19 [phone]

## 2019-08-21 NOTE — Discharge Instructions (Signed)
Please read and follow all provided instructions.  Your diagnoses today include:  1. Epigastric pain     Tests performed today include:  Blood counts and electrolytes  Blood tests to check liver and kidney function  Blood tests to check pancreas function  Urine test to look for infection  Vital signs. See below for your results today.   Medications prescribed:   Pantoprazole (Protonix) - stomach acid reducer   Pepcid (famotidine) - antihistamine  You can find this medication over-the-counter.   DO NOT exceed:   20mg  Pepcid every 12 hours   Zofran (ondansetron) - for nausea and vomiting   Carafate - for stomach upset and to protect your stomach  Take any prescribed medications only as directed.  Home care instructions:   Follow any educational materials contained in this packet.  Follow-up instructions: Please follow-up with your primary care provider in the next 3 days for further evaluation of your symptoms.    Return instructions:  SEEK IMMEDIATE MEDICAL ATTENTION IF:  The pain does not go away or becomes severe   A temperature above 101F develops   Repeated vomiting occurs (multiple episodes)   The pain becomes localized to portions of the abdomen. The right side could possibly be appendicitis. In an adult, the left lower portion of the abdomen could be colitis or diverticulitis.   Blood is being passed in stools or vomit (bright red or black tarry stools)   You develop chest pain, difficulty breathing, dizziness or fainting, or become confused, poorly responsive, or inconsolable (young children)  If you have any other emergent concerns regarding your health  Additional Information: Abdominal (belly) pain can be caused by many things. Your caregiver performed an examination and possibly ordered blood/urine tests and imaging (CT scan, x-rays, ultrasound). Many cases can be observed and treated at home after initial evaluation in the emergency  department. Even though you are being discharged home, abdominal pain can be unpredictable. Therefore, you need a repeated exam if your pain does not resolve, returns, or worsens. Most patients with abdominal pain don't have to be admitted to the hospital or have surgery, but serious problems like appendicitis and gallbladder attacks can start out as nonspecific pain. Many abdominal conditions cannot be diagnosed in one visit, so follow-up evaluations are very important.  Your vital signs today were: BP (!) 163/113 (BP Location: Right Arm)   Pulse 85   Temp 98 F (36.7 C)   Resp 20   LMP 10/19/2015   SpO2 100%  If your blood pressure (bp) was elevated above 135/85 this visit, please have this repeated by your doctor within one month. --------------

## 2019-08-21 NOTE — ED Notes (Signed)
Patient unable to give UA sample at this time.

## 2019-10-12 ENCOUNTER — Encounter (HOSPITAL_BASED_OUTPATIENT_CLINIC_OR_DEPARTMENT_OTHER): Payer: Self-pay

## 2019-10-12 ENCOUNTER — Emergency Department (HOSPITAL_BASED_OUTPATIENT_CLINIC_OR_DEPARTMENT_OTHER)
Admission: EM | Admit: 2019-10-12 | Discharge: 2019-10-12 | Disposition: A | Discharge status: Home / Self Care | Payer: Medicaid Other | Attending: Emergency Medicine | Admitting: Emergency Medicine

## 2019-10-12 ENCOUNTER — Other Ambulatory Visit: Payer: Self-pay

## 2019-10-12 ENCOUNTER — Emergency Department (HOSPITAL_BASED_OUTPATIENT_CLINIC_OR_DEPARTMENT_OTHER): Payer: Medicaid Other

## 2019-10-12 DIAGNOSIS — M25522 Pain in left elbow: Secondary | ICD-10-CM | POA: Diagnosis not present

## 2019-10-12 DIAGNOSIS — J45909 Unspecified asthma, uncomplicated: Secondary | ICD-10-CM | POA: Diagnosis not present

## 2019-10-12 DIAGNOSIS — I10 Essential (primary) hypertension: Secondary | ICD-10-CM | POA: Insufficient documentation

## 2019-10-12 DIAGNOSIS — M79602 Pain in left arm: Secondary | ICD-10-CM

## 2019-10-12 DIAGNOSIS — Z91040 Latex allergy status: Secondary | ICD-10-CM | POA: Diagnosis not present

## 2019-10-12 DIAGNOSIS — M25532 Pain in left wrist: Secondary | ICD-10-CM | POA: Diagnosis not present

## 2019-10-12 MED ORDER — OXYCODONE-ACETAMINOPHEN 5-325 MG PO TABS
1.0000 | ORAL_TABLET | Freq: Once | ORAL | Status: AC
  Administered 2019-10-12: 1 via ORAL
  Filled 2019-10-12: qty 1

## 2019-10-12 MED ORDER — OXYCODONE-ACETAMINOPHEN 5-325 MG PO TABS: 1.0000 | ORAL_TABLET | Freq: Four times a day (QID) | ORAL | 0 refills | Status: DC | PRN

## 2019-10-12 MED ORDER — DICLOFENAC SODIUM 1 % EX GEL: 2.0000 g | Freq: Four times a day (QID) | CUTANEOUS | 0 refills | Status: DC | PRN

## 2019-10-12 MED ORDER — IBUPROFEN 800 MG PO TABS: 800.0000 mg | ORAL_TABLET | Freq: Three times a day (TID) | ORAL | 0 refills | Status: DC | PRN

## 2019-10-12 NOTE — ED Triage Notes (Addendum)
Pt c/o pain from left wrist to elbow-started yesterday-swelling to fingers today-denies injury-states she was dx with "carpal tunnel" 2 years ago-no surgery-NAD-steady gait

## 2019-10-12 NOTE — ED Provider Notes (Signed)
Emergency Department Provider Note   I have reviewed the triage vital signs and the nursing notes.   HISTORY  Chief Complaint Arm Pain   HPI Tanya Herrera is a 34 y.o. female with past medical history reviewed below presents to the emergency department for evaluation of pain and swelling of the left wrist and elbow.  Patient states symptoms began yesterday.  She has a tingling feeling in the third, fourth, fifth digits on the left which is intermittent.  Patient has pain on exam and symptoms are worse with movement.  She notes some mild swelling.  She not experiencing chest pain or shortness of breath.  She does note a history of carpal tunnel but states that she tried using a brace on the wrist with minimal relief in symptoms.  Denies any fevers, chills, joint redness, warmth.  Past Medical History:  Diagnosis Date  . Asthma   . Family history of adverse reaction to anesthesia   . H/O transfusion of packed red blood cells   . Hemophilia (HCC)   . HHT (hereditary hemorrhagic telangiectasia) (HCC)   . Hypertension   . Insomnia   . Major depressive disorder   . Panic disorder   . PTSD (post-traumatic stress disorder)     Patient Active Problem List   Diagnosis Date Noted  . MDD (major depressive disorder), severe (HCC) 04/09/2018  . Asthma 01/25/2015  . Gastro-esophageal reflux disease without esophagitis 01/25/2015  . Obesity with body mass index 30 or greater 08/04/2014  . Allergic rhinitis due to pollen 06/23/2014  . Anxiety 07/27/2013  . Essential (primary) hypertension 07/27/2013  . Anemia 06/28/2013  . Major depressive disorder, single episode, unspecified 11/26/2011  . Migraine without aura 07/16/2011    Past Surgical History:  Procedure Laterality Date  . ABDOMINAL HYSTERECTOMY    . ABDOMINAL SURGERY    . CESAREAN SECTION     x 3  . DILATION AND CURETTAGE OF UTERUS    . ENDOMETRIAL ABLATION    . NOSE SURGERY    . TUBAL LIGATION      Allergies Iron,  Iron dextran, Latex, Banana, and Penicillins  No family history on file.  Social History Social History   Tobacco Use  . Smoking status: Never Smoker  . Smokeless tobacco: Never Used  Vaping Use  . Vaping Use: Never used  Substance Use Topics  . Alcohol use: Yes    Comment: occassionally  . Drug use: No    Review of Systems  Constitutional: No fever/chills Eyes: No visual changes. ENT: No sore throat. Cardiovascular: Denies chest pain. Respiratory: Denies shortness of breath. Gastrointestinal: No abdominal pain.  No nausea, no vomiting.  No diarrhea.  No constipation. Genitourinary: Negative for dysuria. Musculoskeletal: Negative for back pain. Positive left wrist and elbow pain.  Skin: Negative for rash. Neurological: Negative for headaches. Tingling in the left fingers.   10-point ROS otherwise negative.  ____________________________________________   PHYSICAL EXAM:  VITAL SIGNS: ED Triage Vitals  Enc Vitals Group     BP 10/12/19 1952 120/82     Pulse Rate 10/12/19 1952 80     Resp 10/12/19 1952 18     Temp 10/12/19 1952 98.4 F (36.9 C)     Temp Source 10/12/19 1952 Oral     SpO2 10/12/19 1952 100 %     Weight 10/12/19 1952 229 lb (103.9 kg)     Height 10/12/19 1952 5\' 5"  (1.651 m)   Constitutional: Alert and oriented. Well appearing and in  no acute distress. Eyes: Conjunctivae are normal.  Head: Atraumatic. Nose: No congestion/rhinnorhea. Mouth/Throat: Mucous membranes are moist.  Neck: No stridor.   Cardiovascular: Normal rate, regular rhythm. Good peripheral circulation. Grossly normal heart sounds.   Respiratory: Normal respiratory effort.  No retractions. Lungs CTAB. Gastrointestinal: No distention.  Musculoskeletal: Mild swelling noted of the fingers of the left hand.  Patient with limited range of motion primarily at the left wrist with mild associated swelling.  No bruising, erythema, warmth.  Mild tenderness at the left lateral elbow without  joint effusion.  Range of motion of the left elbow is near normal.  Normal range of motion of the left shoulder. Neurologic:  Normal speech and language. No gross focal neurologic deficits are appreciated.  Skin:  Skin is warm, dry and intact. No rash noted. No bruising or joint redness/warmth.   ____________________________________________  RADIOLOGY  DG Elbow Complete Left  Result Date: 10/12/2019 CLINICAL DATA:  34 year old female with left upper extremity pain. No known injury. EXAM: LEFT WRIST - COMPLETE 3+ VIEW; LEFT ELBOW - COMPLETE 3+ VIEW COMPARISON:  None. FINDINGS: There is no acute fracture or dislocation. The bones are well mineralized. No arthritic changes. 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 No joint effusion. The soft tissues are unremarkable. IMPRESSION: 1. No acute fracture or dislocation. 2. Lunotriquetral coalition. Electronically Signed   By: Elgie Collard M.D.   On: 10/12/2019 21:17   DG Wrist Complete Left  Result Date: 10/12/2019 CLINICAL DATA:  34 year old female with left upper extremity pain. No known injury. EXAM: LEFT WRIST - COMPLETE 3+ VIEW; LEFT ELBOW - COMPLETE 3+ VIEW COMPARISON:  None. FINDINGS: There is no acute fracture or dislocation. The bones are well mineralized. No arthritic changes. 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 No joint effusion. The soft tissues are unremarkable. IMPRESSION: 1. No acute fracture or dislocation. 2. Lunotriquetral coalition. Electronically Signed   By: Elgie Collard M.D.   On: 10/12/2019 21:17    ____________________________________________   PROCEDURES  Procedure(s) performed:   Procedures  None  ____________________________________________   INITIAL IMPRESSION / ASSESSMENT AND PLAN / ED COURSE  Pertinent labs & imaging results that were available during my care of the patient were reviewed by me and considered in my medical decision making (see chart for details).   Patient presents to the emergency department  with pain and swelling to the left wrist and left elbow.  Exam is not consistent with a spontaneous hemarthrosis or septic joint.  Patient has tingling in an ulnar nerve distribution.  Question compressive radiculopathy from the elbow.  Will place patient in a brace.  Imaging of the wrist and elbow reviewed with no acute findings.  Plan for sports medicine follow-up along with RICE at home. No neuro deficits on exam.  Patient having more tingling/paresthesias in the third, fourth, fifth fingers.  No neck or shoulder discomfort.  No other neuro deficits to suspect stroke.   ____________________________________________  FINAL CLINICAL IMPRESSION(S) / ED DIAGNOSES  Final diagnoses:  Left arm pain     MEDICATIONS GIVEN DURING THIS VISIT:  Medications  oxyCODONE-acetaminophen (PERCOCET/ROXICET) 5-325 MG per tablet 1 tablet (1 tablet Oral Given 10/12/19 2112)     NEW OUTPATIENT MEDICATIONS STARTED DURING THIS VISIT:  Discharge Medication List as of 10/12/2019  9:28 PM    START taking these medications   Details  diclofenac Sodium (VOLTAREN) 1 % GEL Apply 2 g topically 4 (four) times daily as needed., Starting Wed 10/12/2019, Normal    ibuprofen (ADVIL) 800 MG tablet  Take 1 tablet (800 mg total) by mouth every 8 (eight) hours as needed., Starting Wed 10/12/2019, Normal    oxyCODONE-acetaminophen (PERCOCET/ROXICET) 5-325 MG tablet Take 1 tablet by mouth every 6 (six) hours as needed for severe pain., Starting Wed 10/12/2019, Normal        Note:  This document was prepared using Dragon voice recognition software and may include unintentional dictation errors.  Alona Bene, MD, [facility]    Yanky Vanderburg, Arlyss Repress, MD 10/13/19 Zollie Pee

## 2019-10-12 NOTE — ED Notes (Signed)
Pt discharged to home. Discharge instructions have been discussed with patient and/or family members. Pt verbally acknowledges understanding d/c instructions, and endorses comprehension to checkout at registration before leaving.  °

## 2019-10-12 NOTE — Discharge Instructions (Signed)
You were seen in the emergency room today with left wrist and elbow pain.  I am referring you to a sports medicine doctor for follow-up.  Please call in the morning to schedule a follow-up appointment.  I have called in some stronger pain medication, Percocet.  Please only take this for severe pain.  It can cause constipation and can impair your ability to drive a car.  He cannot take it with other pain or anxiety medications as it can cause increased drowsiness.  Please use ibuprofen or the topical pain medications primarily to decrease inflammation.  If you develop redness around the wrist, fever, or other worsening symptoms please return to the emergency department.

## 2019-10-24 ENCOUNTER — Ambulatory Visit: Payer: Self-pay

## 2019-10-24 ENCOUNTER — Ambulatory Visit (INDEPENDENT_AMBULATORY_CARE_PROVIDER_SITE_OTHER): Payer: Medicaid Other | Admitting: Family Medicine

## 2019-10-24 ENCOUNTER — Encounter: Payer: Self-pay | Admitting: Family Medicine

## 2019-10-24 ENCOUNTER — Other Ambulatory Visit: Payer: Self-pay

## 2019-10-24 VITALS — BP 148/85 | Ht 66.0 in | Wt 220.0 lb

## 2019-10-24 DIAGNOSIS — M79642 Pain in left hand: Secondary | ICD-10-CM

## 2019-10-24 DIAGNOSIS — M25532 Pain in left wrist: Secondary | ICD-10-CM

## 2019-10-24 MED ORDER — PREDNISONE 5 MG PO TABS: ORAL_TABLET | ORAL | 0 refills | Status: DC

## 2019-10-24 NOTE — Progress Notes (Signed)
Tanya Herrera - 34 y.o. female MRN [id number]  Date of birth: June 04, 1985  SUBJECTIVE:  Including CC & ROS.  Chief Complaint  Patient presents with  . Hand Pain    left     Tanya Herrera is a 34 y.o. female that is presenting with left wrist pain.  The pain has been ongoing for a few weeks.  Denies a specific inciting event.  Has been using a brace with little to no improvement.  Has tried.  Independent review of the left elbow and wrist x-ray from 7/21 shows no acute abnormality.   Review of Systems See HPI   HISTORY: Past Medical, Surgical, Social, and Family History Reviewed & Updated per EMR.   Pertinent Historical Findings include:  Past Medical History:  Diagnosis Date  . Asthma   . Family history of adverse reaction to anesthesia   . H/O transfusion of packed red blood cells   . Hemophilia (HCC)   . HHT (hereditary hemorrhagic telangiectasia) (HCC)   . Hypertension   . Insomnia   . Major depressive disorder   . Panic disorder   . PTSD (post-traumatic stress disorder)     Past Surgical History:  Procedure Laterality Date  . ABDOMINAL HYSTERECTOMY    . ABDOMINAL SURGERY    . CESAREAN SECTION     x 3  . DILATION AND CURETTAGE OF UTERUS    . ENDOMETRIAL ABLATION    . NOSE SURGERY    . TUBAL LIGATION      No family history on file.  Social History   Socioeconomic History  . Marital status: Married    Spouse name: Not on file  . Number of children: Not on file  . Years of education: Not on file  . Highest education level: Not on file  Occupational History  . Not on file  Tobacco Use  . Smoking status: Never Smoker  . Smokeless tobacco: Never Used  Vaping Use  . Vaping Use: Never used  Substance and Sexual Activity  . Alcohol use: Yes    Comment: occassionally  . Drug use: No  . Sexual activity: Not on file  Other Topics Concern  . Not on file  Social History Narrative  . Not on file   Social Determinants of Health   Financial Resource  Strain:   . Difficulty of Paying Living Expenses:   Food Insecurity:   . Worried About Programme researcher, broadcasting/film/video in the Last Year:   . Barista in the Last Year:   Transportation Needs:   . Freight forwarder (Medical):   Marland Kitchen Lack of Transportation (Non-Medical):   Physical Activity:   . Days of Exercise per Week:   . Minutes of Exercise per Session:   Stress:   . Feeling of Stress :   Social Connections:   . Frequency of Communication with Friends and Family:   . Frequency of Social Gatherings with Friends and Family:   . Attends Religious Services:   . Active Member of Clubs or Organizations:   . Attends Banker Meetings:   Marland Kitchen Marital Status:   Intimate Partner Violence:   . Fear of Current or Ex-Partner:   . Emotionally Abused:   Marland Kitchen Physically Abused:   . Sexually Abused:      PHYSICAL EXAM:  VS: BP (!) 148/85   Ht 5\' 6"  (1.676 m)   Wt 220 lb (99.8 kg)   LMP 10/19/2015   BMI 35.51 kg/m  Physical Exam  Gen: NAD, alert, cooperative with exam, well-appearing MSK:  Left wrist: Normal range of motion. Normal grip strength. Tenderness palpation over the dorsal carpal bones and over the TFCC. Negative Tinel's at the wrist. Neurovascularly intact  Limited ultrasound: Left wrist:  There appears to be an effusion in the distal radius joint. Mild effusion noted over the ECU with no hyperemia. No significant degenerative change of the CMC.  Summary: Findings would suggest the possibility inflammatory aspect of her wrist pain.  Ultrasound and interpretation by Clare Gandy, MD    ASSESSMENT & PLAN:   Arthralgia of left wrist Possible for inflammatory process as to the source of her pain as it does not follow with a specific carpal tunnel pattern.  Mother has history of rheumatoid. -Counseled on supportive care. -Uric acid, ANA panel, sed rate, CRP -Prednisone.

## 2019-10-24 NOTE — Assessment & Plan Note (Signed)
Possible for inflammatory process as to the source of her pain as it does not follow with a specific carpal tunnel pattern.  Mother has history of rheumatoid. -Counseled on supportive care. -Uric acid, ANA panel, sed rate, CRP -Prednisone.

## 2019-10-24 NOTE — Patient Instructions (Signed)
Nice to meet you Please try ice  Please try the brace at night  I will call with the results from today   Please send me a message in MyChart with any questions or updates.  Please see me back in 4 weeks.   --Dr. Jordan Likes

## 2019-10-26 ENCOUNTER — Telehealth: Payer: Self-pay | Admitting: Family Medicine

## 2019-10-26 LAB — ANA,IFA RA DIAG PNL W/RFLX TIT/PATN
ANA Titer 1: NEGATIVE
Cyclic Citrullin Peptide Ab: 5 units (ref 0–19)
Rheumatoid fact SerPl-aCnc: <10 IU/mL (ref 0.0–13.9)

## 2019-10-26 LAB — C-REACTIVE PROTEIN: CRP: 2 mg/L (ref 0–10)

## 2019-10-26 LAB — SEDIMENTATION RATE: Sed Rate: 16 mm/hr (ref 0–32)

## 2019-10-26 NOTE — Telephone Encounter (Signed)
Pt called states discussed lab results w/ Dr.Schmitz this morning, b ut now says wrist has swollen & she is in pain ---wishing provider to call her .[phone].  --glh

## 2019-10-26 NOTE — Telephone Encounter (Signed)
Informed of results.   Myra Rude, MD [facility] 10/26/2019, 10:41 AM

## 2019-10-27 NOTE — Telephone Encounter (Signed)
Busy signal when placing call.   Myra Rude, MD [facility] 10/27/2019, 8:36 AM

## 2019-10-31 ENCOUNTER — Telehealth: Payer: Self-pay | Admitting: Family Medicine

## 2019-10-31 DIAGNOSIS — M25532 Pain in left wrist: Secondary | ICD-10-CM

## 2019-10-31 NOTE — Telephone Encounter (Signed)
Not improving with prednisone. Will try PT and injection.   Myra Rude, MD [facility] 10/31/2019, 5:09 PM

## 2019-10-31 NOTE — Telephone Encounter (Signed)
Patient called states she tried taking the steroid as prescribed but it kept making her sick (so she stopped)  -- Per patient Lt wrist is still swollen & very painful.  --Forwarding message to med asst for review w/provider on f/u with patient.  Ph# [phone].  --glh

## 2019-11-03 ENCOUNTER — Emergency Department (HOSPITAL_BASED_OUTPATIENT_CLINIC_OR_DEPARTMENT_OTHER)
Admission: EM | Admit: 2019-11-03 | Discharge: 2019-11-03 | Disposition: A | Discharge status: Home / Self Care | Payer: Medicaid Other | Attending: Emergency Medicine | Admitting: Emergency Medicine

## 2019-11-03 ENCOUNTER — Encounter (HOSPITAL_BASED_OUTPATIENT_CLINIC_OR_DEPARTMENT_OTHER): Payer: Self-pay

## 2019-11-03 ENCOUNTER — Other Ambulatory Visit: Payer: Self-pay

## 2019-11-03 ENCOUNTER — Emergency Department (HOSPITAL_BASED_OUTPATIENT_CLINIC_OR_DEPARTMENT_OTHER): Payer: Medicaid Other

## 2019-11-03 DIAGNOSIS — Z91040 Latex allergy status: Secondary | ICD-10-CM | POA: Insufficient documentation

## 2019-11-03 DIAGNOSIS — Z7951 Long term (current) use of inhaled steroids: Secondary | ICD-10-CM | POA: Insufficient documentation

## 2019-11-03 DIAGNOSIS — U071 COVID-19: Secondary | ICD-10-CM | POA: Diagnosis not present

## 2019-11-03 DIAGNOSIS — J45909 Unspecified asthma, uncomplicated: Secondary | ICD-10-CM | POA: Insufficient documentation

## 2019-11-03 DIAGNOSIS — R05 Cough: Secondary | ICD-10-CM | POA: Insufficient documentation

## 2019-11-03 DIAGNOSIS — J069 Acute upper respiratory infection, unspecified: Secondary | ICD-10-CM | POA: Diagnosis not present

## 2019-11-03 DIAGNOSIS — R509 Fever, unspecified: Secondary | ICD-10-CM | POA: Diagnosis present

## 2019-11-03 DIAGNOSIS — I10 Essential (primary) hypertension: Secondary | ICD-10-CM | POA: Insufficient documentation

## 2019-11-03 LAB — URINALYSIS, ROUTINE W REFLEX MICROSCOPIC
Bilirubin Urine: NEGATIVE
Glucose, UA: NEGATIVE mg/dL
Hgb urine dipstick: NEGATIVE
Ketones, ur: NEGATIVE mg/dL
Leukocytes,Ua: NEGATIVE
Nitrite: NEGATIVE
Protein, ur: NEGATIVE mg/dL
Specific Gravity, Urine: 1.015 (ref 1.005–1.030)
pH: 6 (ref 5.0–8.0)

## 2019-11-03 LAB — CBC WITH DIFFERENTIAL/PLATELET
Abs Immature Granulocytes: 0 10*3/uL (ref 0.00–0.07)
Basophils Absolute: 0 10*3/uL (ref 0.0–0.1)
Basophils Relative: 0 %
Eosinophils Absolute: 0 10*3/uL (ref 0.0–0.5)
Eosinophils Relative: 0 %
HCT: 40.2 % (ref 36.0–46.0)
Hemoglobin: 13.7 g/dL (ref 12.0–15.0)
Immature Granulocytes: 0 %
Lymphocytes Relative: 17 %
Lymphs Abs: 0.6 10*3/uL — ABNORMAL LOW (ref 0.7–4.0)
MCH: 29.1 pg (ref 26.0–34.0)
MCHC: 34.1 g/dL (ref 30.0–36.0)
MCV: 85.5 fL (ref 80.0–100.0)
Monocytes Absolute: 0.3 10*3/uL (ref 0.1–1.0)
Monocytes Relative: 9 %
Neutro Abs: 2.9 10*3/uL (ref 1.7–7.7)
Neutrophils Relative %: 74 %
Platelets: 175 10*3/uL (ref 150–400)
RBC: 4.7 MIL/uL (ref 3.87–5.11)
RDW: 12.8 % (ref 11.5–15.5)
WBC: 3.9 10*3/uL — ABNORMAL LOW (ref 4.0–10.5)
nRBC: 0 % (ref 0.0–0.2)

## 2019-11-03 LAB — BASIC METABOLIC PANEL
Anion gap: 10 (ref 5–15)
BUN: 7 mg/dL (ref 6–20)
CO2: 19 mmol/L — ABNORMAL LOW (ref 22–32)
Calcium: 9 mg/dL (ref 8.9–10.3)
Chloride: 102 mmol/L (ref 98–111)
Creatinine, Ser: 0.75 mg/dL (ref 0.44–1.00)
GFR calc Af Amer: >60 mL/min (ref >60)
GFR calc non Af Amer: >60 mL/min (ref >60)
Glucose, Bld: 99 mg/dL (ref 70–99)
Potassium: 3.7 mmol/L (ref 3.5–5.1)
Sodium: 131 mmol/L — ABNORMAL LOW (ref 135–145)

## 2019-11-03 LAB — SARS CORONAVIRUS 2 BY RT PCR (HOSPITAL ORDER, PERFORMED IN ~~LOC~~ HOSPITAL LAB): SARS Coronavirus 2: POSITIVE — AB

## 2019-11-03 MED ORDER — ACETAMINOPHEN 500 MG PO TABS
ORAL_TABLET | ORAL | Status: AC
  Administered 2019-11-03: 1000 mg
  Filled 2019-11-03: qty 2

## 2019-11-03 MED ORDER — DEXAMETHASONE SODIUM PHOSPHATE 10 MG/ML IJ SOLN
10.0000 mg | Freq: Once | INTRAMUSCULAR | Status: AC
  Administered 2019-11-03: 10 mg via INTRAVENOUS
  Filled 2019-11-03: qty 1

## 2019-11-03 MED ORDER — IBUPROFEN 400 MG PO TABS
600.0000 mg | ORAL_TABLET | Freq: Once | ORAL | Status: AC
  Administered 2019-11-03: 600 mg via ORAL
  Filled 2019-11-03: qty 1

## 2019-11-03 MED ORDER — SODIUM CHLORIDE 0.9 % IV BOLUS
1000.0000 mL | Freq: Once | INTRAVENOUS | Status: AC
  Administered 2019-11-03: 1000 mL via INTRAVENOUS

## 2019-11-03 MED ORDER — BENZONATATE 100 MG PO CAPS
100.0000 mg | ORAL_CAPSULE | Freq: Three times a day (TID) | ORAL | 0 refills | Status: DC | PRN
Start: 2019-11-03 — End: 2021-01-31

## 2019-11-03 NOTE — ED Provider Notes (Signed)
  Physical Exam  BP 119/73   Pulse 98   Temp 98 F (36.7 C)   Resp 16   LMP 10/19/2015   SpO2 99%   Physical Exam Vitals and nursing note reviewed.  Constitutional:      General: She is not in acute distress.    Appearance: She is well-developed. She is not diaphoretic.  HENT:     Head: Normocephalic and atraumatic.  Eyes:     General: No scleral icterus.    Conjunctiva/sclera: Conjunctivae normal.  Pulmonary:     Effort: Pulmonary effort is normal. No respiratory distress.  Musculoskeletal:     Cervical back: Normal range of motion.  Skin:    Findings: No rash.  Neurological:     Mental Status: She is alert.     ED Course/Procedures   Clinical Course as of Nov 03 1927  Thu Nov 03, 2019  1745 SARS Coronavirus 2(!): POSITIVE [LJ]  1745 No acute abnormalities  DG Chest Portable 1 View [LJ]  1809 Improved from 103.3 Fahrenheit after 500 mg of Tylenol.  Temp(!): 101.9 F (38.8 C) [LJ]  1809 Not hypoxic  SpO2: 100 % [LJ]  1809 Improved from 133  Pulse Rate(!): 116 [LJ]  1809 Patient presents with symptoms consistent with COVID-19.  She has not been vaccinated.  Her COVID-19 test is positive.  She is still febrile and tachycardic.  We will give a liter of IV fluids as well as additional Motrin.  Will give IV Decadron.  Will reassess.   [LJ]  1831 Pulse Rate(!): 106 [LJ]  1831 BP: 120/83 [LJ]    Clinical Course User Index [LJ] Placido Sou, PA-C    Procedures  MDM  34 year old female with Covid infection.  Patient was febrile and tachycardic on arrival here.  Lab work is unremarkable.  Her tachycardia and fever improved with antipyretics and IV fluids.  She is not requiring supplemental oxygen at this time.  She will be discharged home with symptomatic treatment and PCP follow-up.  Patient is hemodynamically stable, in NAD, and able to ambulate in the ED. Evaluation does not show pathology that would require ongoing emergent intervention or inpatient treatment.  I explained the diagnosis to the patient. Pain has been managed and has no complaints prior to discharge. Patient is comfortable with above plan and is stable for discharge at this time. All questions were answered prior to disposition. Strict return precautions for returning to the ED were discussed. Encouraged follow up with PCP.   An After Visit Summary was printed and given to the patient.   Portions of this note were generated with Scientist, clinical (histocompatibility and immunogenetics). Dictation errors may occur despite best attempts at proofreading.     Dietrich Pates, PA-C 11/03/19 1932    Charlynne Pander, MD 11/03/19 [id number]

## 2019-11-03 NOTE — ED Triage Notes (Signed)
Pt c/o day 2 of flu sx-NAD-steady gait

## 2019-11-03 NOTE — ED Provider Notes (Signed)
[facility] EMERGENCY DEPARTMENT Provider Note   CSN: [id number] Arrival date & time: 11/03/19  1545     History Chief Complaint  Patient presents with  . Cough    Tanya Herrera is a 34 y.o. female.  HPI   Patient is a 34 year old female with a medical history as noted below.  She states yesterday she began experiencing multiple symptoms including fevers, chills, body aches, cough, sore throat, diaphoresis, fatigue, decreased appetite, watery brown diarrhea.  No hematochezia.  No nausea or vomiting.  Her symptoms have progressively worsened so she came to the emergency department.  She was found to have a temperature of 103.3 Fahrenheit and was tachycardic at 133.  She was given 500 mg of Tylenol in triage.  Her current temperature is 101.9 F and her pulse has improved to 116.  She denies chest pain, shortness of breath, abdominal pain, dysuria, hematuria, syncope.     Past Medical History:  Diagnosis Date  . Asthma   . Family history of adverse reaction to anesthesia   . H/O transfusion of packed red blood cells   . Hemophilia (HCC)   . HHT (hereditary hemorrhagic telangiectasia) (HCC)   . Hypertension   . Insomnia   . Major depressive disorder   . Panic disorder   . PTSD (post-traumatic stress disorder)    Patient Active Problem List   Diagnosis Date Noted  . Arthralgia of left wrist 10/24/2019  . MDD (major depressive disorder), severe (HCC) 04/09/2018  . Asthma 01/25/2015  . Gastro-esophageal reflux disease without esophagitis 01/25/2015  . Obesity with body mass index 30 or greater 08/04/2014  . Allergic rhinitis due to pollen 06/23/2014  . Anxiety 07/27/2013  . Essential (primary) hypertension 07/27/2013  . Anemia 06/28/2013  . Major depressive disorder, single episode, unspecified 11/26/2011  . Migraine without aura 07/16/2011   Past Surgical History:  Procedure Laterality Date  . ABDOMINAL HYSTERECTOMY    . ABDOMINAL SURGERY    . CESAREAN  SECTION     x 3  . DILATION AND CURETTAGE OF UTERUS    . ENDOMETRIAL ABLATION    . NOSE SURGERY    . TUBAL LIGATION       OB History   No obstetric history on file.     No family history on file.  Social History   Tobacco Use  . Smoking status: Never Smoker  . Smokeless tobacco: Never Used  Vaping Use  . Vaping Use: Never used  Substance Use Topics  . Alcohol use: Yes    Comment: occassionally  . Drug use: No    Home Medications Prior to Admission medications   Medication Sig Start Date End Date Taking? Authorizing Provider  albuterol (PROVENTIL HFA;VENTOLIN HFA) 108 (90 Base) MCG/ACT inhaler Inhale 2 puffs into the lungs every 4 (four) hours as needed for wheezing or shortness of breath. 04/19/17   Doug SouJacubowitz, Sam, MD  albuterol (PROVENTIL) (2.5 MG/3ML) 0.083% nebulizer solution Take 3 mLs (2.5 mg total) by nebulization every 4 (four) hours as needed for wheezing or shortness of breath. 04/19/17   Doug SouJacubowitz, Sam, MD  clonazePAM (KLONOPIN) 1 MG tablet Take 1 mg by mouth at bedtime as needed for anxiety.  01/04/18   [provider]  diclofenac Sodium (VOLTAREN) 1 % GEL Apply 2 g topically 4 (four) times daily as needed. 10/12/19   Long, Arlyss RepressJoshua G, MD  diphenhydrAMINE (BENADRYL) 25 mg capsule Take 1 capsule (25 mg total) by mouth every 6 (six) hours as needed  for itching. 09/04/18   [place], Alvira Philips, DO  famotidine (PEPCID) 20 MG tablet Take 1 tablet (20 mg total) by mouth 2 (two) times daily. 08/21/19   Renne Crigler, PA-C  FLUoxetine (PROZAC) 40 MG capsule Take 80 mg by mouth daily.  03/23/18   [provider]  ibuprofen (ADVIL) 800 MG tablet Take 1 tablet (800 mg total) by mouth every 8 (eight) hours as needed. 10/12/19   Long, Arlyss Repress, MD  ondansetron (ZOFRAN ODT) 4 MG disintegrating tablet Take 1 tablet (4 mg total) by mouth every 8 (eight) hours as needed for nausea or vomiting. 08/21/19   Renne Crigler, PA-C  oxyCODONE-acetaminophen (PERCOCET/ROXICET) 5-325  MG tablet Take 1 tablet by mouth every 6 (six) hours as needed for severe pain. 10/12/19   Long, Arlyss Repress, MD  pantoprazole (PROTONIX) 20 MG tablet Take 1 tablet (20 mg total) by mouth daily. 08/21/19   Renne Crigler, PA-C  predniSONE (DELTASONE) 5 MG tablet Take 6 pills for first day, 5 pills second day, 4 pills third day, 3 pills fourth day, 2 pills the fifth day, and 1 pill sixth day. 10/24/19   Myra Rude, MD  sucralfate (CARAFATE) 1 g tablet Take 1 tablet (1 g total) by mouth 4 (four) times daily -  with meals and at bedtime. 08/21/19   Renne Crigler, PA-C  traZODone (DESYREL) 50 MG tablet Take 100 mg by mouth at bedtime.  03/25/18   [provider]    Allergies    Iron, Iron dextran, Latex, Banana, and Penicillins  Review of Systems   Review of Systems  All other systems reviewed and are negative. Ten systems reviewed and are negative for acute change, except as noted in the HPI.   Physical Exam Updated Vital Signs BP (!) 136/109 (BP Location: Right Arm)   Pulse (!) 116   Temp (!) 101.9 F (38.8 C) (Oral)   Resp 19   LMP 10/19/2015   SpO2 100%   Physical Exam Vitals and nursing note reviewed.  Constitutional:      General: She is in acute distress.     Appearance: Normal appearance. She is ill-appearing. She is not toxic-appearing or diaphoretic.  HENT:     Head: Normocephalic and atraumatic.     Right Ear: External ear normal.     Left Ear: External ear normal.     Nose: Nose normal.     Mouth/Throat:     Mouth: Mucous membranes are moist.     Pharynx: Oropharynx is clear. No oropharyngeal exudate or posterior oropharyngeal erythema.  Eyes:     Extraocular Movements: Extraocular movements intact.  Cardiovascular:     Rate and Rhythm: Regular rhythm. Tachycardia present.     Pulses: Normal pulses.     Heart sounds: Normal heart sounds. No murmur heard.  No friction rub. No gallop.   Pulmonary:     Effort: Pulmonary effort is normal. No respiratory  distress.     Breath sounds: Normal breath sounds. No stridor. No wheezing, rhonchi or rales.  Abdominal:     General: Abdomen is flat.     Palpations: Abdomen is soft.     Tenderness: There is no abdominal tenderness.  Musculoskeletal:        General: Normal range of motion.     Cervical back: Normal range of motion and neck supple. No tenderness.     Right lower leg: No edema.     Left lower leg: No edema.  Skin:  General: Skin is warm and dry.  Neurological:     General: No focal deficit present.     Mental Status: She is alert and oriented to person, place, and time.  Psychiatric:        Mood and Affect: Mood normal.        Behavior: Behavior normal.    ED Results / Procedures / Treatments   Labs (all labs ordered are listed, but only abnormal results are displayed) Labs Reviewed  SARS CORONAVIRUS 2 BY RT PCR (HOSPITAL ORDER, PERFORMED IN [facility]) - Abnormal; Notable for the following components:      Result Value   SARS Coronavirus 2 POSITIVE (*)    All other components within normal limits  CBC WITH DIFFERENTIAL/PLATELET - Abnormal; Notable for the following components:   WBC 3.9 (*)    Lymphs Abs 0.6 (*)    All other components within normal limits  BASIC METABOLIC PANEL - Abnormal; Notable for the following components:   Sodium 131 (*)    CO2 19 (*)    All other components within normal limits  URINALYSIS, ROUTINE W REFLEX MICROSCOPIC   EKG None  Radiology DG Chest Portable 1 View  Result Date: 11/03/2019 CLINICAL DATA:  Cough, fever EXAM: PORTABLE CHEST 1 VIEW COMPARISON:  CT 09/03/2018 FINDINGS: Accounting for body habitus, the lungs are clear. No consolidation, features of edema, pneumothorax, or effusion. Pulmonary vascularity is normally distributed. The cardiomediastinal contours are unremarkable. No acute osseous or soft tissue abnormality. IMPRESSION: No active disease. Electronically Signed   By: Kreg Shropshire M.D.   On: 11/03/2019 17:42    Procedures Procedures   Medications Ordered in ED Medications  acetaminophen (TYLENOL) 500 MG tablet (1,000 mg  Given 11/03/19 1600)  sodium chloride 0.9 % bolus 1,000 mL (1,000 mLs Intravenous New Bag/Given 11/03/19 1820)  ibuprofen (ADVIL) tablet 600 mg (600 mg Oral Given 11/03/19 1823)  dexamethasone (DECADRON) injection 10 mg (10 mg Intravenous Given 11/03/19 1824)   ED Course  I have reviewed the triage vital signs and the nursing notes.  Pertinent labs & imaging results that were available during my care of the patient were reviewed by me and considered in my medical decision making (see chart for details).  Clinical Course as of Nov 02 1898  Thu Nov 03, 2019  1745 SARS Coronavirus 2(!): POSITIVE [LJ]  1745 No acute abnormalities  DG Chest Portable 1 View [LJ]  1809 Improved from 103.3 Fahrenheit after 500 mg of Tylenol.  Temp(!): 101.9 F (38.8 C) [LJ]  1809 Not hypoxic  SpO2: 100 % [LJ]  1809 Improved from 133  Pulse Rate(!): 116 [LJ]  1809 Patient presents with symptoms consistent with COVID-19.  She has not been vaccinated.  Her COVID-19 test is positive.  She is still febrile and tachycardic.  We will give a liter of IV fluids as well as additional Motrin.  Will give IV Decadron.  Will reassess.   [LJ]  1831 Pulse Rate(!): 106 [LJ]  1831 BP: 120/83 [LJ]    Clinical Course User Index [LJ] Placido Sou, PA-C   MDM Rules/Calculators/A&P                          Pt is a 34 y.o. female that present with a history, physical exam, ED Clinical Course as noted above.   Patient presents today with multiple symptoms consistent with COVID-19.  Patient was found to be Covid positive today.  She  was initially given Tylenol with mild resolution of her fever.  I gave her additional ibuprofen as well as Decadron and IV fluids after discussing pt with my attending physician Dr. Chaney Malling.   Her tachycardia has mostly improved at this time.  Her most recent pulse was 106.  She  is no longer hypertensive.  She is lying comfortably in bed and tells me that she is starting to feel much better.  It is the end of my shift and patient care is being transferred to my colleague Hina Khatri PA-C.  Pt has about 500 cc of IVF left. If her tachycardia resolves and she feels her symptoms have improved I feel that it is safe for d/c at this time.   I have sent her a prescription for Tessalon Perles to her pharmacy.  She has an albuterol inhaler at home. She understands she needs to quarantine for 10 to 14 days from symptom onset.  She was given very strict return precautions and knows she needs to return to the emergency department if she develops any new or worsening symptoms.  I also recommended that she follow-up with her primary care provider tomorrow to make them aware that she is Covid positive at this time.  Note: Portions of this report may have been transcribed using voice recognition software. Every effort was made to ensure accuracy; however, inadvertent computerized transcription errors may be present.   Final Clinical Impression(s) / ED Diagnoses Final diagnoses:  COVID-19  Viral URI with cough   Rx / DC Orders ED Discharge Orders         Ordered    benzonatate (TESSALON) 100 MG capsule  3 times daily PRN     Discontinue  Reprint     11/03/19 1840           Placido Sou, PA-C 11/03/19 1900    Charlynne Pander, MD 11/03/19 2317

## 2019-11-03 NOTE — Discharge Instructions (Addendum)
I prescribed a medication called Tessalon Perles.  This is a cough medication you can take up to 3 times a day for your symptoms.  If you develop worsening symptoms I need you to return to the emergency department for immediate reevaluation.  I would also recommend you follow-up with your primary care provider to let them know that you are COVID positive.  It was a pleasure to meet you.

## 2019-11-21 ENCOUNTER — Ambulatory Visit: Payer: Medicaid Other | Admitting: Family Medicine

## 2019-11-21 NOTE — Progress Notes (Deleted)
  Tanya Herrera - 33 y.o. female MRN [id number]  Date of birth: 04/21/1985  SUBJECTIVE:  Including CC & ROS.  No chief complaint on file.   Tanya Herrera is a 33 y.o. female that is  ***.  ***   Review of Systems See HPI   HISTORY: Past Medical, Surgical, Social, and Family History Reviewed & Updated per EMR.   Pertinent Historical Findings include:  Past Medical History:  Diagnosis Date  . Asthma   . Family history of adverse reaction to anesthesia   . H/O transfusion of packed red blood cells   . Hemophilia (HCC)   . HHT (hereditary hemorrhagic telangiectasia) (HCC)   . Hypertension   . Insomnia   . Major depressive disorder   . Panic disorder   . PTSD (post-traumatic stress disorder)     Past Surgical History:  Procedure Laterality Date  . ABDOMINAL HYSTERECTOMY    . ABDOMINAL SURGERY    . CESAREAN SECTION     x 3  . DILATION AND CURETTAGE OF UTERUS    . ENDOMETRIAL ABLATION    . NOSE SURGERY    . TUBAL LIGATION      No family history on file.  Social History   Socioeconomic History  . Marital status: Married    Spouse name: Not on file  . Number of children: Not on file  . Years of education: Not on file  . Highest education level: Not on file  Occupational History  . Not on file  Tobacco Use  . Smoking status: Never Smoker  . Smokeless tobacco: Never Used  Vaping Use  . Vaping Use: Never used  Substance and Sexual Activity  . Alcohol use: Yes    Comment: occassionally  . Drug use: No  . Sexual activity: Not on file  Other Topics Concern  . Not on file  Social History Narrative  . Not on file   Social Determinants of Health   Financial Resource Strain:   . Difficulty of Paying Living Expenses: Not on file  Food Insecurity:   . Worried About Running Out of Food in the Last Year: Not on file  . Ran Out of Food in the Last Year: Not on file  Transportation Needs:   . Lack of Transportation (Medical): Not on file  . Lack of  Transportation (Non-Medical): Not on file  Physical Activity:   . Days of Exercise per Week: Not on file  . Minutes of Exercise per Session: Not on file  Stress:   . Feeling of Stress : Not on file  Social Connections:   . Frequency of Communication with Friends and Family: Not on file  . Frequency of Social Gatherings with Friends and Family: Not on file  . Attends Religious Services: Not on file  . Active Member of Clubs or Organizations: Not on file  . Attends Club or Organization Meetings: Not on file  . Marital Status: Not on file  Intimate Partner Violence:   . Fear of Current or Ex-Partner: Not on file  . Emotionally Abused: Not on file  . Physically Abused: Not on file  . Sexually Abused: Not on file     PHYSICAL EXAM:  VS: LMP 10/19/2015  Physical Exam Gen: NAD, alert, cooperative with exam, well-appearing MSK:  ***      ASSESSMENT & PLAN:   No problem-specific Assessment & Plan notes found for this encounter.     

## 2019-11-24 ENCOUNTER — Ambulatory Visit: Payer: Medicaid Other | Attending: Family Medicine | Admitting: Physical Therapy

## 2019-11-29 ENCOUNTER — Ambulatory Visit: Payer: Self-pay | Admitting: Family Medicine

## 2019-11-29 NOTE — Progress Notes (Deleted)
  Tanya Herrera - 34 y.o. female MRN [id number]  Date of birth: [birth year]  SUBJECTIVE:  Including CC & ROS.  No chief complaint on file.   Tanya Herrera is a 34 y.o. female that is  ***.  ***   Review of Systems See HPI   HISTORY: Past Medical, Surgical, Social, and Family History Reviewed & Updated per EMR.   Pertinent Historical Findings include:  Past Medical History:  Diagnosis Date  . Asthma   . Family history of adverse reaction to anesthesia   . H/O transfusion of packed red blood cells   . Hemophilia (HCC)   . HHT (hereditary hemorrhagic telangiectasia) (HCC)   . Hypertension   . Insomnia   . Major depressive disorder   . Panic disorder   . PTSD (post-traumatic stress disorder)     Past Surgical History:  Procedure Laterality Date  . ABDOMINAL HYSTERECTOMY    . ABDOMINAL SURGERY    . CESAREAN SECTION     x 3  . DILATION AND CURETTAGE OF UTERUS    . ENDOMETRIAL ABLATION    . NOSE SURGERY    . TUBAL LIGATION      No family history on file.  Social History   Socioeconomic History  . Marital status: Married    Spouse name: Not on file  . Number of children: Not on file  . Years of education: Not on file  . Highest education level: Not on file  Occupational History  . Not on file  Tobacco Use  . Smoking status: Never Smoker  . Smokeless tobacco: Never Used  Vaping Use  . Vaping Use: Never used  Substance and Sexual Activity  . Alcohol use: Yes    Comment: occassionally  . Drug use: No  . Sexual activity: Not on file  Other Topics Concern  . Not on file  Social History Narrative  . Not on file   Social Determinants of Health   Financial Resource Strain:   . Difficulty of Paying Living Expenses: Not on file  Food Insecurity:   . Worried About Programme researcher, broadcasting/film/video in the Last Year: Not on file  . Ran Out of Food in the Last Year: Not on file  Transportation Needs:   . Lack of Transportation (Medical): Not on file  . Lack of  Transportation (Non-Medical): Not on file  Physical Activity:   . Days of Exercise per Week: Not on file  . Minutes of Exercise per Session: Not on file  Stress:   . Feeling of Stress : Not on file  Social Connections:   . Frequency of Communication with Friends and Family: Not on file  . Frequency of Social Gatherings with Friends and Family: Not on file  . Attends Religious Services: Not on file  . Active Member of Clubs or Organizations: Not on file  . Attends Banker Meetings: Not on file  . Marital Status: Not on file  Intimate Partner Violence:   . Fear of Current or Ex-Partner: Not on file  . Emotionally Abused: Not on file  . Physically Abused: Not on file  . Sexually Abused: Not on file     PHYSICAL EXAM:  VS: LMP 10/19/2015  Physical Exam Gen: NAD, alert, cooperative with exam, well-appearing MSK:  ***      ASSESSMENT & PLAN:   No problem-specific Assessment & Plan notes found for this encounter.

## 2020-03-21 ENCOUNTER — Other Ambulatory Visit (HOSPITAL_BASED_OUTPATIENT_CLINIC_OR_DEPARTMENT_OTHER): Payer: Self-pay | Admitting: Emergency Medicine

## 2020-03-21 ENCOUNTER — Emergency Department (HOSPITAL_BASED_OUTPATIENT_CLINIC_OR_DEPARTMENT_OTHER): Payer: Medicaid Other

## 2020-03-21 ENCOUNTER — Emergency Department (HOSPITAL_BASED_OUTPATIENT_CLINIC_OR_DEPARTMENT_OTHER)
Admission: EM | Admit: 2020-03-21 | Discharge: 2020-03-21 | Disposition: A | Discharge status: Home / Self Care | Payer: Medicaid Other | Attending: Emergency Medicine | Admitting: Emergency Medicine

## 2020-03-21 ENCOUNTER — Other Ambulatory Visit: Payer: Self-pay

## 2020-03-21 DIAGNOSIS — Z91040 Latex allergy status: Secondary | ICD-10-CM | POA: Insufficient documentation

## 2020-03-21 DIAGNOSIS — J45909 Unspecified asthma, uncomplicated: Secondary | ICD-10-CM | POA: Diagnosis not present

## 2020-03-21 DIAGNOSIS — I10 Essential (primary) hypertension: Secondary | ICD-10-CM | POA: Insufficient documentation

## 2020-03-21 DIAGNOSIS — R0602 Shortness of breath: Secondary | ICD-10-CM

## 2020-03-21 DIAGNOSIS — R0789 Other chest pain: Secondary | ICD-10-CM | POA: Diagnosis not present

## 2020-03-21 LAB — BASIC METABOLIC PANEL
Anion gap: 9 (ref 5–15)
BUN: 8 mg/dL (ref 6–20)
CO2: 22 mmol/L (ref 22–32)
Calcium: 9.1 mg/dL (ref 8.9–10.3)
Chloride: 103 mmol/L (ref 98–111)
Creatinine, Ser: 0.71 mg/dL (ref 0.44–1.00)
GFR, Estimated: >60 mL/min (ref >60)
Glucose, Bld: 130 mg/dL — ABNORMAL HIGH (ref 70–99)
Potassium: 3.8 mmol/L (ref 3.5–5.1)
Sodium: 134 mmol/L — ABNORMAL LOW (ref 135–145)

## 2020-03-21 LAB — CBC
HCT: 36.1 % (ref 36.0–46.0)
Hemoglobin: 11.9 g/dL — ABNORMAL LOW (ref 12.0–15.0)
MCH: 26.9 pg (ref 26.0–34.0)
MCHC: 33 g/dL (ref 30.0–36.0)
MCV: 81.7 fL (ref 80.0–100.0)
Platelets: 227 10*3/uL (ref 150–400)
RBC: 4.42 MIL/uL (ref 3.87–5.11)
RDW: 14.3 % (ref 11.5–15.5)
WBC: 5.5 10*3/uL (ref 4.0–10.5)
nRBC: 0 % (ref 0.0–0.2)

## 2020-03-21 LAB — D-DIMER, QUANTITATIVE: D-Dimer, Quant: <0.27 ug/mL-FEU (ref 0.00–0.50)

## 2020-03-21 LAB — TROPONIN I (HIGH SENSITIVITY)
Troponin I (High Sensitivity): 3 ng/L (ref <18)
Troponin I (High Sensitivity): 3 ng/L (ref <18)

## 2020-03-21 MED ORDER — ALBUTEROL SULFATE HFA 108 (90 BASE) MCG/ACT IN AERS: 1.0000 | INHALATION_SPRAY | Freq: Four times a day (QID) | RESPIRATORY_TRACT | 0 refills | Status: DC | PRN

## 2020-03-21 MED ORDER — HYDROXYZINE HCL 50 MG PO TABS
50.0000 mg | ORAL_TABLET | Freq: Four times a day (QID) | ORAL | 0 refills | Status: DC | PRN
Start: 2020-03-21 — End: 2020-03-21

## 2020-03-21 MED ORDER — LORAZEPAM 2 MG/ML IJ SOLN
0.5000 mg | Freq: Once | INTRAMUSCULAR | Status: AC
  Administered 2020-03-21: 13:00:00 0.5 mg via INTRAVENOUS
  Filled 2020-03-21: qty 1

## 2020-03-21 MED ORDER — ALBUTEROL SULFATE HFA 108 (90 BASE) MCG/ACT IN AERS
INHALATION_SPRAY | RESPIRATORY_TRACT | Status: AC
  Administered 2020-03-21: 10:00:00 4
  Filled 2020-03-21: qty 6.7

## 2020-03-21 MED FILL — hydrOXYzine HCL 50 MG TABS: 50 | 7 days supply | Qty: 30 | Fill #0

## 2020-03-21 NOTE — ED Notes (Signed)
resp giving puffers at this time

## 2020-03-21 NOTE — ED Notes (Signed)
Patient assessed in triage. Stated she had taken her MDI around 0400 without relief. BBS wheezes. Patient given 4 puffs of Albuterol and taken back to room. When we arrived, she complained on chest pain. Placed on the monitor and EKG obtained. MD and RN aware.

## 2020-03-21 NOTE — ED Triage Notes (Signed)
wheezing and sob since last night  Took last puffer last night

## 2020-03-21 NOTE — Discharge Instructions (Addendum)
Please follow closely with counselor, primary care and psychiatrist.

## 2020-03-21 NOTE — ED Provider Notes (Signed)
[facility] EMERGENCY DEPARTMENT Provider Note   CSN: [id number] Arrival date & time: 03/21/20  1941     History Chief Complaint  Patient presents with  . Shortness of Breath     HPI   Blood pressure 117/79, pulse 77, temperature 98.7 F (37.1 C), temperature source Oral, resp. rate 17, last menstrual period 10/19/2015, SpO2 97 %.  Tanya Herrera is a 34 y.o. female complaining of stabbing chest pain, retrosternal radiating to the back onset last night at about 4 AM, she was already woken up from sleep at that point.  She has been having these issues for several months, it was associated with shortness of breath.  Patient is coughing mildly no fevers or chills.  She is not around anybody that is been ill.  She is fully vaccinated against Covid.  Patient with history of hypertension hyperlipidemia compliant with her medications.  She does not smoke or vape but her husband smokes in the house.  No family history of early cardiac death.  She is not on any estrogen-containing medications.  She endorses calf pain without swelling over the last several weeks with no recent immobilizations.  Patient states that she has a history of anxiety that she is taking clonazepam for that she follows with psychiatry for that she recently started to see a therapist she has had 1 visit with a therapist.  She states that she has 4 kids and that does not really stress her out but in her family she is the most responsible one and she recently lost her grandmother over the summer and was not able to visit her because she had Covid and she is still grieving her.  Patient states that she saw a cardiologist in [facility] several months ago and had a normal stress test, she saw her PCP for similar symptoms last week and had a negative chest x-ray, negative Covid and flu test.    Past Medical History:  Diagnosis Date  . Asthma   . Family history of adverse reaction to anesthesia   . H/O transfusion of  packed red blood cells   . Hemophilia (HCC)   . HHT (hereditary hemorrhagic telangiectasia) (HCC)   . Hypertension   . Insomnia   . Major depressive disorder   . Panic disorder   . PTSD (post-traumatic stress disorder)     Patient Active Problem List   Diagnosis Date Noted  . Arthralgia of left wrist 10/24/2019  . MDD (major depressive disorder), severe (HCC) 04/09/2018  . Asthma 01/25/2015  . Gastro-esophageal reflux disease without esophagitis 01/25/2015  . Obesity with body mass index 30 or greater 08/04/2014  . Allergic rhinitis due to pollen 06/23/2014  . Anxiety 07/27/2013  . Essential (primary) hypertension 07/27/2013  . Anemia 06/28/2013  . Major depressive disorder, single episode, unspecified 11/26/2011  . Migraine without aura 07/16/2011    Past Surgical History:  Procedure Laterality Date  . ABDOMINAL HYSTERECTOMY    . ABDOMINAL SURGERY    . CESAREAN SECTION     x 3  . DILATION AND CURETTAGE OF UTERUS    . ENDOMETRIAL ABLATION    . NOSE SURGERY    . TUBAL LIGATION       OB History   No obstetric history on file.     No family history on file.  Social History   Tobacco Use  . Smoking status: Never Smoker  . Smokeless tobacco: Never Used  Vaping Use  . Vaping Use: Never used  Substance Use Topics  . Alcohol use: Yes    Comment: occassionally  . Drug use: No    Home Medications Prior to Admission medications   Medication Sig Start Date End Date Taking? Authorizing Provider  albuterol (VENTOLIN HFA) 108 (90 Base) MCG/ACT inhaler Inhale 1-2 puffs into the lungs every 6 (six) hours as needed for wheezing or shortness of breath. 03/21/20  Yes Sheree Lalla, Joni ReiningNicole, PA-C  hydrOXYzine (ATARAX/VISTARIL) 50 MG tablet Take 1 tablet (50 mg total) by mouth every 6 (six) hours as needed for anxiety. 03/21/20  Yes Geramy Lamorte, Joni ReiningNicole, PA-C  benzonatate (TESSALON) 100 MG capsule Take 1 capsule (100 mg total) by mouth 3 (three) times daily as needed for cough.  11/03/19   Placido SouJoldersma, Logan, PA-C  clonazePAM (KLONOPIN) 1 MG tablet Take 1 mg by mouth at bedtime as needed for anxiety.  01/04/18   [provider]  diclofenac Sodium (VOLTAREN) 1 % GEL Apply 2 g topically 4 (four) times daily as needed. 10/12/19   Long, Arlyss RepressJoshua G, MD  diphenhydrAMINE (BENADRYL) 25 mg capsule Take 1 capsule (25 mg total) by mouth every 6 (six) hours as needed for itching. 09/04/18   [place], Alvira PhilipsEric J, DO  famotidine (PEPCID) 20 MG tablet Take 1 tablet (20 mg total) by mouth 2 (two) times daily. 08/21/19   Renne CriglerGeiple, Joshua, PA-C  FLUoxetine (PROZAC) 40 MG capsule Take 80 mg by mouth daily.  03/23/18   [provider]  ibuprofen (ADVIL) 800 MG tablet Take 1 tablet (800 mg total) by mouth every 8 (eight) hours as needed. 10/12/19   Long, Arlyss RepressJoshua G, MD  ondansetron (ZOFRAN ODT) 4 MG disintegrating tablet Take 1 tablet (4 mg total) by mouth every 8 (eight) hours as needed for nausea or vomiting. 08/21/19   Renne CriglerGeiple, Joshua, PA-C  oxyCODONE-acetaminophen (PERCOCET/ROXICET) 5-325 MG tablet Take 1 tablet by mouth every 6 (six) hours as needed for severe pain. 10/12/19   Long, Arlyss RepressJoshua G, MD  pantoprazole (PROTONIX) 20 MG tablet Take 1 tablet (20 mg total) by mouth daily. 08/21/19   Renne CriglerGeiple, Joshua, PA-C  predniSONE (DELTASONE) 5 MG tablet Take 6 pills for first day, 5 pills second day, 4 pills third day, 3 pills fourth day, 2 pills the fifth day, and 1 pill sixth day. 10/24/19   Myra RudeSchmitz, Jeremy E, MD  sucralfate (CARAFATE) 1 g tablet Take 1 tablet (1 g total) by mouth 4 (four) times daily -  with meals and at bedtime. 08/21/19   Renne CriglerGeiple, Joshua, PA-C  traZODone (DESYREL) 50 MG tablet Take 100 mg by mouth at bedtime.  03/25/18   [provider]    Allergies    Iron, Iron dextran, Latex, Banana, and Penicillins  Review of Systems   Review of Systems   A complete review of systems was obtained and all systems are negative except as noted in the HPI and PMH.    Physical Exam Updated  Vital Signs BP 124/73 (BP Location: Right Arm)   Pulse 66   Temp 98.7 F (37.1 C) (Oral)   Resp 16   Ht 5\' 6"  (1.676 m)   Wt 100 kg   LMP 10/19/2015   SpO2 100%   BMI 35.58 kg/m   Physical Exam Vitals and nursing note reviewed.  Constitutional:      General: She is not in acute distress.    Appearance: She is well-developed and well-nourished. She is not diaphoretic.  HENT:     Head: Normocephalic.     Mouth/Throat:  Mouth: Oropharynx is clear and moist.  Eyes:     Conjunctiva/sclera: Conjunctivae normal.  Neck:     Vascular: No JVD.     Trachea: No tracheal deviation.  Cardiovascular:     Rate and Rhythm: Normal rate and regular rhythm.     Pulses: Intact distal pulses.     Comments: Radial pulse equal bilaterally Pulmonary:     Effort: Pulmonary effort is normal. No respiratory distress.     Breath sounds: Normal breath sounds. No stridor. No wheezing or rales.  Chest:     Chest wall: No tenderness.  Abdominal:     General: There is no distension.     Palpations: Abdomen is soft. There is no mass.     Tenderness: There is no abdominal tenderness. There is no guarding or rebound.  Musculoskeletal:        General: No tenderness or edema. Normal range of motion.     Cervical back: Normal range of motion.     Comments: No calf asymmetry, superficial collaterals, palpable cords, edema, Homans sign negative bilaterally.    Skin:    General: Skin is warm.  Neurological:     Mental Status: She is alert and oriented to person, place, and time.  Psychiatric:        Mood and Affect: Mood and affect normal.     ED Results / Procedures / Treatments   Labs (all labs ordered are listed, but only abnormal results are displayed) Labs Reviewed  BASIC METABOLIC PANEL - Abnormal; Notable for the following components:      Result Value   Sodium 134 (*)    Glucose, Bld 130 (*)    All other components within normal limits  CBC - Abnormal; Notable for the following  components:   Hemoglobin 11.9 (*)    All other components within normal limits  D-DIMER, QUANTITATIVE (NOT AT [facility])  TROPONIN I (HIGH SENSITIVITY)  TROPONIN I (HIGH SENSITIVITY)    EKG EKG Interpretation  Date/Time:  Wednesday March 21 2020 10:07:06 EST Ventricular Rate:  79 PR Interval:    QRS Duration: 101 QT Interval:  428 QTC Calculation: 491 R Axis:   47 Text Interpretation: Sinus rhythm Borderline prolonged QT interval Confirmed by Kennis Carina [id number]) on 03/21/2020 12:11:40 PM   Radiology DG Chest 2 View  Result Date: 03/21/2020 CLINICAL DATA:  Chest pain and shortness of breath EXAM: CHEST - 2 VIEW COMPARISON:  November 03, 2019 FINDINGS: The lungs are clear. The heart size and pulmonary vascularity are normal. No adenopathy. No pneumothorax. Spina bifida occulta noted at C2 and C3. IMPRESSION: Lungs clear.  Cardiac silhouette within normal limits. Electronically Signed   By: Bretta Bang III M.D.   On: 03/21/2020 11:06    Procedures Procedures (including critical care time)  Medications Ordered in ED Medications  albuterol (VENTOLIN HFA) 108 (90 Base) MCG/ACT inhaler (4 puffs  Given 03/21/20 1002)  LORazepam (ATIVAN) injection 0.5 mg (0.5 mg Intravenous Given 03/21/20 1316)    ED Course  I have reviewed the triage vital signs and the nursing notes.  Pertinent labs & imaging results that were available during my care of the patient were reviewed by me and considered in my medical decision making (see chart for details).    MDM Rules/Calculators/A&P                          Vitals:   03/21/20 1308 03/21/20 1317 03/21/20 1433 03/21/20 1510  BP: (!) 108/58 126/85  124/73  Pulse: 69 88  66  Resp: 20 (!) 24  16  Temp:      TempSrc:      SpO2: 98% 99%  100%  Weight:   100 kg   Height:   5\' 6"  (1.676 m)     Medications  albuterol (VENTOLIN HFA) 108 (90 Base) MCG/ACT inhaler (4 puffs  Given 03/21/20 1002)  LORazepam (ATIVAN) injection 0.5 mg (0.5 mg  Intravenous Given 03/21/20 1316)    Tanya Herrera is 34 y.o. female presenting with stabbing retrosternal pressure-like chest pain lasting for only a few minutes radiating to her back stabbing pain has resolved and then she becomes sore it seems that she has had this for some multiple months at this point she has had a stress test which per patient was normal.  Patient with clear lung sounds, afebrile, nontoxic-appearing, physical exam is not consistent with DVT however she is tachypneic.  No tachycardia, however, she is beta blocked.  So out of an abundance of caution I am going to obtain a D-dimer.  Dimer negative, EKG without acute findings, first troponin negative.  Delta troponin negative, patient remains chest pain-free, encouraged her to follow closely with PCP, cardiology psychiatry and counselor.  Return to ED for any new or worsening symptoms.  Evaluation does not show pathology that would require ongoing emergent intervention or inpatient treatment. Pt is hemodynamically stable and mentating appropriately. Discussed findings and plan with patient/guardian, who agrees with care plan. All questions answered. Return precautions discussed and outpatient follow up given.    Final Clinical Impression(s) / ED Diagnoses Final diagnoses:  Atypical chest pain  SOB (shortness of breath)    Rx / DC Orders ED Discharge Orders         Ordered    albuterol (VENTOLIN HFA) 108 (90 Base) MCG/ACT inhaler  Every 6 hours PRN        03/21/20 1511    hydrOXYzine (ATARAX/VISTARIL) 50 MG tablet  Every 6 hours PRN        03/21/20 1511           Jakaria Lavergne, 03/23/20 03/21/20 1514    03/23/20, MD 03/21/20 1520

## 2020-06-06 IMAGING — CT CT ANGIOGRAPHY CHEST
1 of 10 series · 3 of 16 positions shown · IV contrast (omnipaque)
Comparison: None.

CLINICAL DATA: Shortness of breath

EXAM:
CT ANGIOGRAPHY CHEST WITH CONTRAST
TECHNIQUE: Multidetector CT imaging of the chest was performed using the
standard protocol during bolus administration of intravenous
contrast. Multiplanar CT image reconstructions and MIPs were
obtained to evaluate the vascular anatomy.
CONTRAST:  57mL OMNIPAQUE IOHEXOL 350 MG/ML SOLN

[Series 6: pe thins · axial · 0.79mm/px · z∈[+628,+922]mm · 3 of 295 slices shown]
[im 1/295  lung]
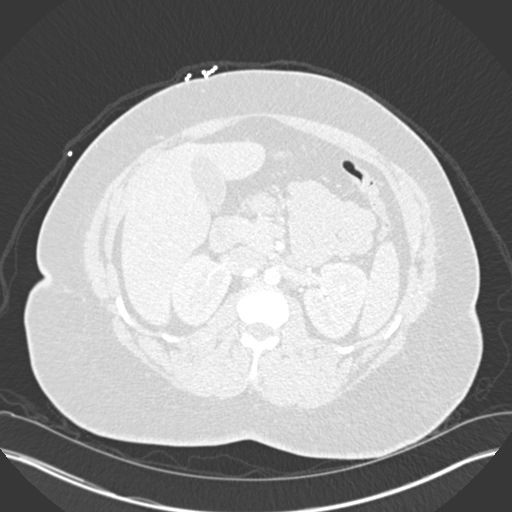
[im 148/295  soft-tissue]
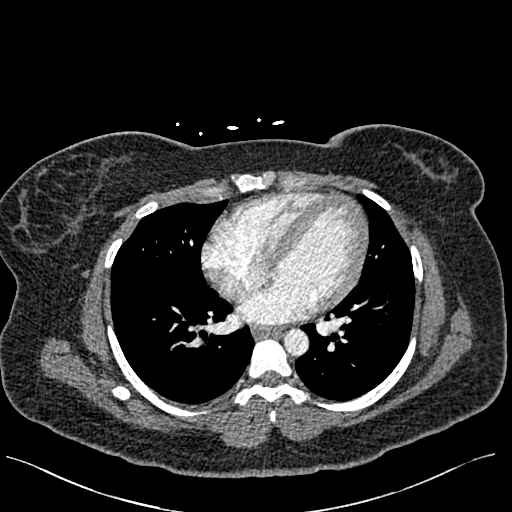
[im 295/295  lung]
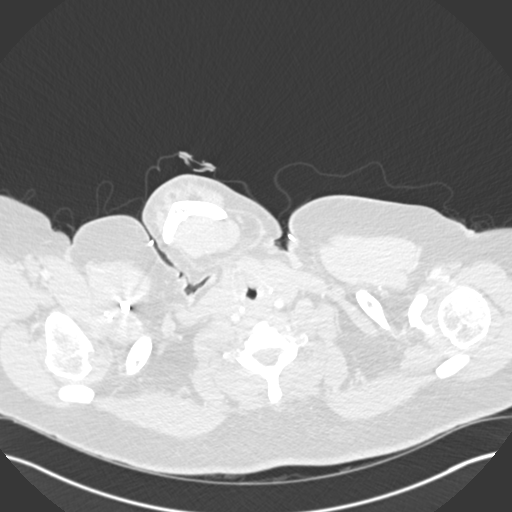

[3 of 16 positions shown; findings below may reference images not displayed]

FINDINGS: Cardiovascular: Heart is normal size. Aorta is normal caliber. No
filling defects in the pulmonary arteries to suggestpulmonary
emboli.

Mediastinum/Nodes: Soft tissue in the anterior mediastinum felt
represent residual thymus. No mediastinal, hilar, or axillary
adenopathy.

Lungs/Pleura: Lungs are clear. No focal airspace opacities or
suspicious nodules. No effusions.

Upper Abdomen: Heterogeneous enhancement in the liver. Diffuse
low-density. Findings likely reflect fatty infiltration.

Musculoskeletal: Chest wall soft tissues are unremarkable. No acute
bony abnormality.

Review of the MIP images confirms the above findings.
IMPRESSION: No evidence of pulmonary embolus.

No acute cardiopulmonary disease.

Suspect fatty infiltration of the liver.

## 2020-06-18 ENCOUNTER — Other Ambulatory Visit: Payer: Self-pay

## 2020-06-18 ENCOUNTER — Emergency Department (HOSPITAL_BASED_OUTPATIENT_CLINIC_OR_DEPARTMENT_OTHER): Payer: Medicaid Other

## 2020-06-18 ENCOUNTER — Emergency Department (HOSPITAL_BASED_OUTPATIENT_CLINIC_OR_DEPARTMENT_OTHER)
Admission: EM | Admit: 2020-06-18 | Discharge: 2020-06-18 | Disposition: A | Discharge status: Home / Self Care | Payer: Medicaid Other | Attending: Emergency Medicine | Admitting: Emergency Medicine

## 2020-06-18 ENCOUNTER — Encounter (HOSPITAL_BASED_OUTPATIENT_CLINIC_OR_DEPARTMENT_OTHER): Payer: Self-pay

## 2020-06-18 DIAGNOSIS — I10 Essential (primary) hypertension: Secondary | ICD-10-CM | POA: Insufficient documentation

## 2020-06-18 DIAGNOSIS — R200 Anesthesia of skin: Secondary | ICD-10-CM | POA: Diagnosis not present

## 2020-06-18 DIAGNOSIS — Z91040 Latex allergy status: Secondary | ICD-10-CM | POA: Insufficient documentation

## 2020-06-18 DIAGNOSIS — G43101 Migraine with aura, not intractable, with status migrainosus: Secondary | ICD-10-CM

## 2020-06-18 DIAGNOSIS — J45909 Unspecified asthma, uncomplicated: Secondary | ICD-10-CM | POA: Insufficient documentation

## 2020-06-18 DIAGNOSIS — R519 Headache, unspecified: Secondary | ICD-10-CM | POA: Diagnosis present

## 2020-06-18 LAB — BASIC METABOLIC PANEL
Anion gap: 9 (ref 5–15)
BUN: 14 mg/dL (ref 6–20)
CO2: 23 mmol/L (ref 22–32)
Calcium: 8.7 mg/dL — ABNORMAL LOW (ref 8.9–10.3)
Chloride: 102 mmol/L (ref 98–111)
Creatinine, Ser: 0.72 mg/dL (ref 0.44–1.00)
GFR, Estimated: >60 mL/min (ref >60)
Glucose, Bld: 100 mg/dL — ABNORMAL HIGH (ref 70–99)
Potassium: 3.7 mmol/L (ref 3.5–5.1)
Sodium: 134 mmol/L — ABNORMAL LOW (ref 135–145)

## 2020-06-18 LAB — CBC WITH DIFFERENTIAL/PLATELET
Abs Immature Granulocytes: 0.01 10*3/uL (ref 0.00–0.07)
Basophils Absolute: 0 10*3/uL (ref 0.0–0.1)
Basophils Relative: 1 %
Eosinophils Absolute: 0.6 10*3/uL — ABNORMAL HIGH (ref 0.0–0.5)
Eosinophils Relative: 9 %
HCT: 37.1 % (ref 36.0–46.0)
Hemoglobin: 12.4 g/dL (ref 12.0–15.0)
Immature Granulocytes: 0 %
Lymphocytes Relative: 32 %
Lymphs Abs: 1.9 10*3/uL (ref 0.7–4.0)
MCH: 26.6 pg (ref 26.0–34.0)
MCHC: 33.4 g/dL (ref 30.0–36.0)
MCV: 79.6 fL — ABNORMAL LOW (ref 80.0–100.0)
Monocytes Absolute: 0.5 10*3/uL (ref 0.1–1.0)
Monocytes Relative: 8 %
Neutro Abs: 3 10*3/uL (ref 1.7–7.7)
Neutrophils Relative %: 50 %
Platelets: 233 10*3/uL (ref 150–400)
RBC: 4.66 MIL/uL (ref 3.87–5.11)
RDW: 15 % (ref 11.5–15.5)
WBC: 6 10*3/uL (ref 4.0–10.5)
nRBC: 0 % (ref 0.0–0.2)

## 2020-06-18 MED ORDER — LACTATED RINGERS IV BOLUS
1000.0000 mL | Freq: Once | INTRAVENOUS | Status: AC
  Administered 2020-06-18: 1000 mL via INTRAVENOUS

## 2020-06-18 MED ORDER — DEXAMETHASONE SODIUM PHOSPHATE 10 MG/ML IJ SOLN
10.0000 mg | Freq: Once | INTRAMUSCULAR | Status: AC
  Administered 2020-06-18: 10 mg via INTRAVENOUS
  Filled 2020-06-18: qty 1

## 2020-06-18 MED ORDER — METOCLOPRAMIDE HCL 5 MG/ML IJ SOLN
10.0000 mg | Freq: Once | INTRAMUSCULAR | Status: AC
  Administered 2020-06-18: 10 mg via INTRAVENOUS
  Filled 2020-06-18: qty 2

## 2020-06-18 NOTE — ED Triage Notes (Signed)
L sided numbness/tingling and weakness since approx 1700 yesterday.  Pt also c/o HA & "light CP" on the L side x 1 day with high BP readings at home.  Pt is compliant with anti-hypertensives.  Fatigue today

## 2020-06-18 NOTE — Discharge Instructions (Signed)
Head CT today is normal without signs of stroke or bleeding.  Labs are normal.

## 2020-06-18 NOTE — Progress Notes (Signed)
EKG performed in triage. EKG given to Dr.Plunkett.  Pt then taken to room 8 via wheelchair. Upon arrival to pt room pt placed into gown and placed on cardiac monitor, blood pressure monitoring and pulse oximetry. Pt vitals taken. RT will continue to monitor and be available as needed.

## 2020-06-18 NOTE — ED Provider Notes (Signed)
[facility] EMERGENCY DEPARTMENT Provider Note   CSN: [id number] Arrival date & time: 06/18/20  1019     History Chief Complaint  Patient presents with  . Numbness    Tanya Herrera is a 35 y.o. female.  Patient is a 35 year old female with a history of hemophilia, hypertension, asthma, depression and migraines who is presenting today with complaints of headache, sensation of dizziness that she describes like vertigo with standing or walking, hypertension, numbness and tingling on the left arm and leg.  She reports the headache started first yesterday a pounding uncomfortable sensation in the left side of her face that does feel like her migraines but then she started to get spots in her vision and when she checked her blood pressure it was elevated.  She takes metoprolol for blood pressure and took a dose last night and symptoms were persistent this morning so she took a second dose.  She reports the symptoms are better but she continues to have a headache and mild tingling in the left upper and lower extremity.  She has had a more persistent cough for the last few weeks but denies any fever or new nasal congestion.  No chest pain, shortness of breath or abdominal pain.  She has had some nausea but no vomiting.  She is sensitive to the light and it seems to make the headache a little bit worse.  She overall just feels very tired.  She denies any drug or alcohol use.  No recent change in medications.  The history is provided by the patient.       Past Medical History:  Diagnosis Date  . Asthma   . Family history of adverse reaction to anesthesia   . H/O transfusion of packed red blood cells   . Hemophilia (HCC)   . HHT (hereditary hemorrhagic telangiectasia) (HCC)   . Hypertension   . Insomnia   . Major depressive disorder   . Panic disorder   . PTSD (post-traumatic stress disorder)     Patient Active Problem List   Diagnosis Date Noted  . Arthralgia of left wrist  10/24/2019  . MDD (major depressive disorder), severe (HCC) 04/09/2018  . Asthma 01/25/2015  . Gastro-esophageal reflux disease without esophagitis 01/25/2015  . Obesity with body mass index 30 or greater 08/04/2014  . Allergic rhinitis due to pollen 06/23/2014  . Anxiety 07/27/2013  . Essential (primary) hypertension 07/27/2013  . Anemia 06/28/2013  . Major depressive disorder, single episode, unspecified 11/26/2011  . Migraine without aura 07/16/2011    Past Surgical History:  Procedure Laterality Date  . ABDOMINAL HYSTERECTOMY    . ABDOMINAL SURGERY    . CESAREAN SECTION     x 3  . DILATION AND CURETTAGE OF UTERUS    . ENDOMETRIAL ABLATION    . NOSE SURGERY    . TUBAL LIGATION       OB History   No obstetric history on file.     History reviewed. No pertinent family history.  Social History   Tobacco Use  . Smoking status: Never Smoker  . Smokeless tobacco: Never Used  Vaping Use  . Vaping Use: Never used  Substance Use Topics  . Alcohol use: Yes    Comment: occassionally  . Drug use: No    Home Medications Prior to Admission medications   Medication Sig Start Date End Date Taking? Authorizing Provider  albuterol (VENTOLIN HFA) 108 (90 Base) MCG/ACT inhaler Inhale 1-2 puffs into the lungs every 6 (six)  hours as needed for wheezing or shortness of breath. 03/21/20  Yes Pisciotta, Joni Reining, PA-C  clonazePAM (KLONOPIN) 1 MG tablet Take 1 mg by mouth at bedtime as needed for anxiety.  01/04/18  Yes [provider]  FLUoxetine (PROZAC) 40 MG capsule Take 80 mg by mouth daily.  03/23/18  Yes [provider]  benzonatate (TESSALON) 100 MG capsule Take 1 capsule (100 mg total) by mouth 3 (three) times daily as needed for cough. 11/03/19   Placido Sou, PA-C  diclofenac Sodium (VOLTAREN) 1 % GEL Apply 2 g topically 4 (four) times daily as needed. 10/12/19   Long, Arlyss Repress, MD  diphenhydrAMINE (BENADRYL) 25 mg capsule Take 1 capsule (25 mg total) by  mouth every 6 (six) hours as needed for itching. 09/04/18   [place], Alvira Philips, DO  famotidine (PEPCID) 20 MG tablet Take 1 tablet (20 mg total) by mouth 2 (two) times daily. 08/21/19   Renne Crigler, PA-C  hydrOXYzine (ATARAX/VISTARIL) 50 MG tablet Take 1 tablet (50 mg total) by mouth every 6 (six) hours as needed for anxiety. 03/21/20   Pisciotta, Joni Reining, PA-C  ibuprofen (ADVIL) 800 MG tablet Take 1 tablet (800 mg total) by mouth every 8 (eight) hours as needed. 10/12/19   Long, Arlyss Repress, MD  ondansetron (ZOFRAN ODT) 4 MG disintegrating tablet Take 1 tablet (4 mg total) by mouth every 8 (eight) hours as needed for nausea or vomiting. 08/21/19   Renne Crigler, PA-C  oxyCODONE-acetaminophen (PERCOCET/ROXICET) 5-325 MG tablet Take 1 tablet by mouth every 6 (six) hours as needed for severe pain. 10/12/19   Long, Arlyss Repress, MD  pantoprazole (PROTONIX) 20 MG tablet Take 1 tablet (20 mg total) by mouth daily. 08/21/19   Renne Crigler, PA-C  predniSONE (DELTASONE) 5 MG tablet Take 6 pills for first day, 5 pills second day, 4 pills third day, 3 pills fourth day, 2 pills the fifth day, and 1 pill sixth day. 10/24/19   Myra Rude, MD  sucralfate (CARAFATE) 1 g tablet Take 1 tablet (1 g total) by mouth 4 (four) times daily -  with meals and at bedtime. 08/21/19   Renne Crigler, PA-C  traZODone (DESYREL) 50 MG tablet Take 100 mg by mouth at bedtime.  03/25/18   [provider]    Allergies    Iron, Iron dextran, Latex, Banana, and Penicillins  Review of Systems   Review of Systems  All other systems reviewed and are negative.   Physical Exam Updated Vital Signs BP (!) 142/88   Pulse 73   Temp 98.8 F (37.1 C) (Oral)   Resp (!) 22   Ht 5\' 6"  (1.676 m)   Wt 102.1 kg   LMP 10/19/2015   SpO2 98%   BMI 36.32 kg/m   Physical Exam Vitals and nursing note reviewed.  Constitutional:      General: She is not in acute distress.    Appearance: Normal appearance. She is well-developed.  HENT:      Head: Normocephalic and atraumatic.     Nose:     Comments: Nasal turbinate edema bilaterally Eyes:     Pupils: Pupils are equal, round, and reactive to light.  Cardiovascular:     Rate and Rhythm: Normal rate and regular rhythm.     Heart sounds: Normal heart sounds. No murmur heard. No friction rub.  Pulmonary:     Effort: Pulmonary effort is normal.     Breath sounds: Normal breath sounds. No wheezing or rales.  Abdominal:  General: Bowel sounds are normal. There is no distension.     Palpations: Abdomen is soft.     Tenderness: There is no abdominal tenderness. There is no guarding or rebound.  Musculoskeletal:        General: No tenderness. Normal range of motion.     Comments: No edema  Skin:    General: Skin is warm and dry.     Findings: No rash.  Neurological:     Mental Status: She is alert and oriented to person, place, and time. Mental status is at baseline.     Cranial Nerves: Cranial nerves are intact. No cranial nerve deficit.     Sensory: Sensation is intact. No sensory deficit.     Motor: Motor function is intact. No weakness or pronator drift.     Gait: Gait is intact.     Comments: No nystagmus.  5 out of 5 strength in bilateral upper and lower extremities.  Sensation is grossly intact.  Psychiatric:        Mood and Affect: Mood normal.        Behavior: Behavior normal.        Thought Content: Thought content normal.     ED Results / Procedures / Treatments   Labs (all labs ordered are listed, but only abnormal results are displayed) Labs Reviewed  CBC WITH DIFFERENTIAL/PLATELET - Abnormal; Notable for the following components:      Result Value   MCV 79.6 (*)    Eosinophils Absolute 0.6 (*)    All other components within normal limits  BASIC METABOLIC PANEL - Abnormal; Notable for the following components:   Sodium 134 (*)    Glucose, Bld 100 (*)    Calcium 8.7 (*)    All other components within normal limits    EKG EKG  Interpretation  Date/Time:  Monday June 18 2020 10:45:36 EDT Ventricular Rate:  74 PR Interval:  178 QRS Duration: 88 QT Interval:  406 QTC Calculation: 450 R Axis:   1 Text Interpretation: Normal sinus rhythm Normal ECG No significant change since last tracing Confirmed by Gwyneth Sproutlunkett, Amed Datta ([id number]) on 06/18/2020 12:39:30 PM   Radiology CT Head Wo Contrast  Result Date: 06/18/2020 CLINICAL DATA:  Headache and dizziness.  Left-sided numbness EXAM: CT HEAD WITHOUT CONTRAST TECHNIQUE: Contiguous axial images were obtained from the base of the skull through the vertex without intravenous contrast. COMPARISON:  February 19, 2019 FINDINGS: Brain: Ventricles and sulci are normal in size and configuration. There is no intracranial mass, hemorrhage, extra-axial fluid collection, or midline shift. Brain parenchyma appears unremarkable. There is no appreciable acute infarct. Vascular: No hyperdense vessel.  No evident vascular calcification. Skull: The bony calvarium appears intact. Sinuses/Orbits: Visualized paranasal sinuses are clear. Visualized orbits appear symmetric bilaterally. Other: Visualized mastoid air cells are clear. IMPRESSION: Study within normal limits. Electronically Signed   By: Bretta BangWilliam  Woodruff III M.D.   On: 06/18/2020 12:02    Procedures Procedures   Medications Ordered in ED Medications  metoCLOPramide (REGLAN) injection 10 mg (10 mg Intravenous Given 06/18/20 1226)  dexamethasone (DECADRON) injection 10 mg (10 mg Intravenous Given 06/18/20 1226)  lactated ringers bolus 1,000 mL (1,000 mLs Intravenous New Bag/Given 06/18/20 1229)    ED Course  I have reviewed the triage vital signs and the nursing notes.  Pertinent labs & imaging results that were available during my care of the patient were reviewed by me and considered in my medical decision making (see chart for details).  MDM Rules/Calculators/A&P                          Patient is a 35 year old female presenting  today with symptoms of headache, numbness and tingling in the left arm and leg and a sensation of dizziness.  Patient has a history of hypertension and also has a history of migraines.  She reports it is difficult sometimes to tell if the symptoms are related to migraine her blood pressure.  Patient did take an additional metoprolol here and blood pressure today is reasonably controlled at 142/88.  She denies any head trauma and has not had any near syncope.  She does feel slightly vertiginous with sitting up but has no focal cerebellar findings.  She was able to ambulate without difficulty.  Currently complaining of significant pain from a headache in the left side of her face but she has not taken any medication for this.  Low suspicion at this time for vertebral artery dissection or posterior cerebellar stroke.  She denies any infectious symptoms concerning for meningitis or sinusitis.  Given patient's headache, hypertension and numbness and tingling and hx of hemophilia a CT was done given symptoms started last night but there is no evidence of stroke or other acute findings.  Sinuses are also clear.  EKG without acute findings.  Suspect patient symptoms are most likely related to migraine.  Migraine cocktail given.  Labs are pending.  Will repeat evaluation after medications.  1:19 PM Head CT wnl.  EKG wnl.  Labs without acute findings and pt's symptoms completely reosolved after headache cocktail and she would like ot go home.  MDM Number of Diagnoses or Management Options   Amount and/or Complexity of Data Reviewed Clinical lab tests: ordered and reviewed Tests in the radiology section of CPT: ordered and reviewed Tests in the medicine section of CPT: ordered and reviewed Independent visualization of images, tracings, or specimens: yes  Patient Progress Patient progress: improved   Final Clinical Impression(s) / ED Diagnoses Final diagnoses:  Migraine with aura and with status  migrainosus, not intractable    Rx / DC Orders ED Discharge Orders    None       Gwyneth Sprout, MD 06/18/20 1321

## 2020-08-13 ENCOUNTER — Encounter (HOSPITAL_BASED_OUTPATIENT_CLINIC_OR_DEPARTMENT_OTHER): Payer: Self-pay

## 2020-08-13 ENCOUNTER — Emergency Department (HOSPITAL_BASED_OUTPATIENT_CLINIC_OR_DEPARTMENT_OTHER)
Admission: EM | Admit: 2020-08-13 | Discharge: 2020-08-13 | Disposition: A | Discharge status: Home / Self Care | Payer: Medicaid Other | Attending: Emergency Medicine | Admitting: Emergency Medicine

## 2020-08-13 ENCOUNTER — Other Ambulatory Visit: Payer: Self-pay

## 2020-08-13 ENCOUNTER — Other Ambulatory Visit (HOSPITAL_BASED_OUTPATIENT_CLINIC_OR_DEPARTMENT_OTHER): Payer: Self-pay

## 2020-08-13 DIAGNOSIS — J45909 Unspecified asthma, uncomplicated: Secondary | ICD-10-CM | POA: Diagnosis not present

## 2020-08-13 DIAGNOSIS — I10 Essential (primary) hypertension: Secondary | ICD-10-CM | POA: Diagnosis not present

## 2020-08-13 DIAGNOSIS — M25522 Pain in left elbow: Secondary | ICD-10-CM | POA: Insufficient documentation

## 2020-08-13 DIAGNOSIS — Z91040 Latex allergy status: Secondary | ICD-10-CM | POA: Insufficient documentation

## 2020-08-13 DIAGNOSIS — R2232 Localized swelling, mass and lump, left upper limb: Secondary | ICD-10-CM | POA: Diagnosis not present

## 2020-08-13 DIAGNOSIS — M25532 Pain in left wrist: Secondary | ICD-10-CM | POA: Diagnosis not present

## 2020-08-13 DIAGNOSIS — M79602 Pain in left arm: Secondary | ICD-10-CM | POA: Diagnosis present

## 2020-08-13 MED ORDER — PREDNISONE 10 MG PO TABS
40.0000 mg | ORAL_TABLET | Freq: Every day | ORAL | 0 refills | Status: AC
  Filled 2020-08-13: qty 12, 3d supply, fill #0

## 2020-08-13 MED ORDER — KETOROLAC TROMETHAMINE 30 MG/ML IJ SOLN
30.0000 mg | Freq: Once | INTRAMUSCULAR | Status: AC
  Administered 2020-08-13: 30 mg via INTRAMUSCULAR
  Filled 2020-08-13: qty 1

## 2020-08-13 MED ORDER — IBUPROFEN 600 MG PO TABS
600.0000 mg | ORAL_TABLET | Freq: Four times a day (QID) | ORAL | 0 refills | Status: DC | PRN
  Filled 2020-08-13: qty 30, 8d supply, fill #0

## 2020-08-13 NOTE — ED Triage Notes (Signed)
Pt c/o pain/swelling from left wrist to elbow x 2 days-denies injury-states she has hx of "carpal tunnel"-pt wearing velcro splint-NAD-steady gait

## 2020-08-13 NOTE — ED Provider Notes (Signed)
[facility] EMERGENCY DEPARTMENT Provider Note   CSN: [id number] Arrival date & time: 08/13/20  1159     History Chief Complaint  Patient presents with  . Arm Pain    Tanya Herrera is a 35 y.o. female.  HPI Patient is a 35 year old female with past medical history significant for asthma, HTN, MDD, panic disorder, PTSD, arthralgia of left wrist  Patient states that she struggles of ongoing left wrist pain and elbow pain.  She states is been going on for years.  She has been treated with a wrist splint and told that she has carpal tunnel.  She states however that she feels that her symptoms are worse over the past couple days.  She denies any trauma to her arm.  She denies any numbness or weakness.  She states that it feels swollen.  She denies any fevers chills lightheadedness or dizziness.  She states she has difficulty moving her arm secondary to pain however states that she is still able to move her arm well.  No other associate symptoms.  She has been taking Tylenol at home.     Past Medical History:  Diagnosis Date  . Asthma   . Family history of adverse reaction to anesthesia   . H/O transfusion of packed red blood cells   . Hemophilia (HCC)   . HHT (hereditary hemorrhagic telangiectasia) (HCC)   . Hypertension   . Insomnia   . Major depressive disorder   . Panic disorder   . PTSD (post-traumatic stress disorder)     Patient Active Problem List   Diagnosis Date Noted  . Arthralgia of left wrist 10/24/2019  . MDD (major depressive disorder), severe (HCC) 04/09/2018  . Asthma 01/25/2015  . Gastro-esophageal reflux disease without esophagitis 01/25/2015  . Obesity with body mass index 30 or greater 08/04/2014  . Allergic rhinitis due to pollen 06/23/2014  . Anxiety 07/27/2013  . Essential (primary) hypertension 07/27/2013  . Anemia 06/28/2013  . Major depressive disorder, single episode, unspecified 11/26/2011  . Migraine without aura 07/16/2011     Past Surgical History:  Procedure Laterality Date  . ABDOMINAL HYSTERECTOMY    . ABDOMINAL SURGERY    . CESAREAN SECTION     x 3  . DILATION AND CURETTAGE OF UTERUS    . ENDOMETRIAL ABLATION    . NOSE SURGERY    . TUBAL LIGATION       OB History   No obstetric history on file.     No family history on file.  Social History   Tobacco Use  . Smoking status: Never Smoker  . Smokeless tobacco: Never Used  Vaping Use  . Vaping Use: Never used  Substance Use Topics  . Alcohol use: Yes    Comment: occassionally  . Drug use: No    Home Medications Prior to Admission medications   Medication Sig Start Date End Date Taking? Authorizing Provider  ibuprofen (ADVIL) 600 MG tablet Take 1 tablet (600 mg total) by mouth every 6 (six) hours as needed. 08/13/20  Yes Ryson Bacha S, PA  predniSONE (DELTASONE) 10 MG tablet Take 4 tablets (40 mg total) by mouth daily for 3 days. 08/13/20 08/16/20 Yes Coriann Brouhard S, PA  albuterol (VENTOLIN HFA) 108 (90 Base) MCG/ACT inhaler Inhale 1-2 puffs into the lungs every 6 (six) hours as needed for wheezing or shortness of breath. 03/21/20   Pisciotta, Joni Reining, PA-C  benzonatate (TESSALON) 100 MG capsule Take 1 capsule (100 mg total) by mouth 3 (  three) times daily as needed for cough. 11/03/19   Placido Sou, PA-C  clonazePAM (KLONOPIN) 1 MG tablet Take 1 mg by mouth at bedtime as needed for anxiety.  01/04/18   [provider]  diclofenac Sodium (VOLTAREN) 1 % GEL Apply 2 g topically 4 (four) times daily as needed. 10/12/19   Long, Arlyss Repress, MD  diphenhydrAMINE (BENADRYL) 25 mg capsule Take 1 capsule (25 mg total) by mouth every 6 (six) hours as needed for itching. 09/04/18   [place], Alvira Philips, DO  famotidine (PEPCID) 20 MG tablet Take 1 tablet (20 mg total) by mouth 2 (two) times daily. 08/21/19   Renne Crigler, PA-C  FLUoxetine (PROZAC) 40 MG capsule Take 80 mg by mouth daily.  03/23/18   [provider]  hydrOXYzine  (ATARAX/VISTARIL) 50 MG tablet TAKE 1 TABLET BY MOUTH EVERY 6 HOURS AS NEEDED FOR ANXIETY 03/21/20 03/21/21  Pisciotta, Joni Reining, PA-C  ondansetron (ZOFRAN ODT) 4 MG disintegrating tablet Take 1 tablet (4 mg total) by mouth every 8 (eight) hours as needed for nausea or vomiting. 08/21/19   Renne Crigler, PA-C  oxyCODONE-acetaminophen (PERCOCET/ROXICET) 5-325 MG tablet Take 1 tablet by mouth every 6 (six) hours as needed for severe pain. 10/12/19   Long, Arlyss Repress, MD  pantoprazole (PROTONIX) 20 MG tablet Take 1 tablet (20 mg total) by mouth daily. 08/21/19   Renne Crigler, PA-C  sucralfate (CARAFATE) 1 g tablet Take 1 tablet (1 g total) by mouth 4 (four) times daily -  with meals and at bedtime. 08/21/19   Renne Crigler, PA-C  traZODone (DESYREL) 50 MG tablet Take 100 mg by mouth at bedtime.  03/25/18   [provider]    Allergies    Iron, Iron dextran, Latex, Banana, and Penicillins  Review of Systems   Review of Systems  Constitutional: Negative for fever.  HENT: Negative for congestion.   Respiratory: Negative for shortness of breath.   Cardiovascular: Negative for chest pain.  Gastrointestinal: Negative for abdominal distention.  Musculoskeletal:       Wrist elbow forearm pain  Neurological: Negative for dizziness and headaches.    Physical Exam Updated Vital Signs BP (!) 128/94 (BP Location: Right Arm)   Pulse 83   Temp 98.4 F (36.9 C) (Oral)   Resp 20   Ht 5\' 5"  (1.651 m)   Wt 109.3 kg   LMP 10/19/2015   SpO2 99%   BMI 40.10 kg/m   Physical Exam Vitals and nursing note reviewed.  Constitutional:      General: She is not in acute distress.    Appearance: Normal appearance. She is not ill-appearing.     Comments: Pleasant well-appearing 35 year old.  In no acute distress.  Sitting comfortably in bed.  Able answer questions appropriately follow commands. No increased work of breathing. Speaking in full sentences.  HENT:     Head: Normocephalic and atraumatic.   Eyes:     General: No scleral icterus.       Right eye: No discharge.        Left eye: No discharge.     Conjunctiva/sclera: Conjunctivae normal.  Pulmonary:     Effort: Pulmonary effort is normal.     Breath sounds: No stridor.  Musculoskeletal:     Comments: No focal tenderness to palpation of the left elbow.  There is symptoms to palpation of the left wrist that is diffuse.  There is no notable swelling.  No redness or warmth to touch.  Skin is warm and  dry and symmetric to right wrist.  She is able to flex and extend her wrist approximately half normal range of motion of this joint without pain and refuses to flex or extend further because of pain.  Bilateral radial artery pulses are 3+ and symmetric.  Cap refill intact in bilateral fingertips  Skin:    General: Skin is warm and dry.  Neurological:     Mental Status: She is alert and oriented to person, place, and time. Mental status is at baseline.     ED Results / Procedures / Treatments   Labs (all labs ordered are listed, but only abnormal results are displayed) Labs Reviewed - No data to display  EKG None  Radiology No results found.  Procedures Procedures   Medications Ordered in ED Medications  ketorolac (TORADOL) 30 MG/ML injection 30 mg (30 mg Intramuscular Given 08/13/20 1500)    ED Course  I have reviewed the triage vital signs and the nursing notes.  Pertinent labs & imaging results that were available during my care of the patient were reviewed by me and considered in my medical decision making (see chart for details).    MDM Rules/Calculators/A&P                          Patient is well-appearing 35 year old female with ongoing history of left wrist pain seems to be worse over the past 2 days.  She feels that there is swelling.  My exam is relatively reassuring she does have movement of her left wrist and left elbow.  No significant tenderness palpation other than some mild tenderness to palpation of  the left wrist.  It is diffuse and nonfocal.  No focal bony tenderness.  She said no trauma to indicate concern for fracture.  Good cap refill and good pulses in extremity.  Doubt septic arthritis.  Some moderate suspicion for gout versus carpal tunnel.  Recommend Tylenol ibuprofen and will prescribe patient a short course of prednisone strict return precautions and will follow up with her PCP on Wednesday.  Final Clinical Impression(s) / ED Diagnoses Final diagnoses:  Left arm pain    Rx / DC Orders ED Discharge Orders         Ordered    predniSONE (DELTASONE) 10 MG tablet  Daily        08/13/20 1443    ibuprofen (ADVIL) 600 MG tablet  Every 6 hours PRN        08/13/20 1443           Gailen Shelter, [place] 08/13/20 1537    Terrilee Files, MD 08/13/20 1824

## 2020-08-13 NOTE — Discharge Instructions (Signed)
Your arm pain today very well may be related to carpal tunnel syndrome however it is very well may be related to a condition called gout as well.  I do not see any evidence on my examination of your arm that would indicate an infection. I suspect this is an inflammatory condition.  Please take prednisone as prescribed.  Please follow-up closely with your primary care provider.  If your symptoms worsen or you start having fevers please immediately return to emergency department for further evaluation.  Please rest ice and elevate and take ibuprofen 600 mg every 6 hours.  You may alternate with 1000 mg of Tylenol every 6 hours as well.

## 2020-10-29 ENCOUNTER — Ambulatory Visit: Payer: Self-pay

## 2020-10-29 ENCOUNTER — Ambulatory Visit (INDEPENDENT_AMBULATORY_CARE_PROVIDER_SITE_OTHER): Payer: Medicaid Other | Admitting: Family Medicine

## 2020-10-29 ENCOUNTER — Other Ambulatory Visit: Payer: Self-pay

## 2020-10-29 ENCOUNTER — Encounter (HOSPITAL_BASED_OUTPATIENT_CLINIC_OR_DEPARTMENT_OTHER): Payer: Self-pay | Admitting: Emergency Medicine

## 2020-10-29 ENCOUNTER — Emergency Department (HOSPITAL_BASED_OUTPATIENT_CLINIC_OR_DEPARTMENT_OTHER)
Admission: EM | Admit: 2020-10-29 | Discharge: 2020-10-29 | Disposition: A | Discharge status: Home / Self Care | Payer: Medicaid Other | Attending: Emergency Medicine | Admitting: Emergency Medicine

## 2020-10-29 ENCOUNTER — Other Ambulatory Visit (HOSPITAL_BASED_OUTPATIENT_CLINIC_OR_DEPARTMENT_OTHER): Payer: Self-pay

## 2020-10-29 VITALS — Ht 67.0 in | Wt 239.0 lb

## 2020-10-29 DIAGNOSIS — M25562 Pain in left knee: Secondary | ICD-10-CM

## 2020-10-29 DIAGNOSIS — J45909 Unspecified asthma, uncomplicated: Secondary | ICD-10-CM | POA: Diagnosis not present

## 2020-10-29 DIAGNOSIS — I10 Essential (primary) hypertension: Secondary | ICD-10-CM | POA: Insufficient documentation

## 2020-10-29 DIAGNOSIS — M222X2 Patellofemoral disorders, left knee: Secondary | ICD-10-CM | POA: Diagnosis not present

## 2020-10-29 DIAGNOSIS — M659 Synovitis and tenosynovitis, unspecified: Secondary | ICD-10-CM

## 2020-10-29 MED ORDER — TRIAMCINOLONE ACETONIDE 40 MG/ML IJ SUSP
40.0000 mg | Freq: Once | INTRAMUSCULAR | Status: AC
  Administered 2020-10-29: 40 mg via INTRA_ARTICULAR

## 2020-10-29 MED ORDER — OXYCODONE HCL 5 MG PO TABS
5.0000 mg | ORAL_TABLET | ORAL | 0 refills | Status: DC | PRN
Start: 2020-10-29 — End: 2020-10-31
  Filled 2020-10-29: qty 8, 2d supply, fill #0

## 2020-10-29 MED ORDER — OXYCODONE HCL 5 MG PO TABS
5.0000 mg | ORAL_TABLET | Freq: Once | ORAL | Status: AC
  Administered 2020-10-29: 5 mg via ORAL
  Filled 2020-10-29: qty 1

## 2020-10-29 NOTE — ED Provider Notes (Signed)
[facility]PT [facility] Emergency Department Provider Note MRN:  [id number]  Arrival date & time: 10/29/20     Chief Complaint   Knee Pain   History of Present Illness   Tanya Herrera is a 35 y.o. year-old female with a history of hereditary hemorrhagic telangiectasia presenting to the ED with chief complaint of knee pain.  Patient is struggled with knee pain since she was in high school.  She injured it during athletics and MRI revealed a torn MCL.  Due to some family issues she never underwent surgical repair.  Through the years she has occasional pain in the knee, especially when it rains or with increased exertion.  She is experiencing increased pain, worse than normal over the past 48 hours.  Still able to walk and move the knee but with limping.  Denies any fever, no new trauma, no chest pain or shortness of breath, no other complaints.  Was evaluated by primary care doctor and provided with meloxicam, not helping.  Review of Systems  A complete 10 system review of systems was obtained and all systems are negative except as noted in the HPI and PMH.   Patient's Health History    Past Medical History:  Diagnosis Date   Asthma    Family history of adverse reaction to anesthesia    H/O transfusion of packed red blood cells    Hemophilia (HCC)    HHT (hereditary hemorrhagic telangiectasia) (HCC)    Hypertension    Insomnia    Major depressive disorder    Panic disorder    PTSD (post-traumatic stress disorder)     Past Surgical History:  Procedure Laterality Date   ABDOMINAL HYSTERECTOMY     ABDOMINAL SURGERY     CESAREAN SECTION     x 3   DILATION AND CURETTAGE OF UTERUS     ENDOMETRIAL ABLATION     NOSE SURGERY     TUBAL LIGATION      No family history on file.  Social History   Socioeconomic History   Marital status: Married    Spouse name: Not on file   Number of children: Not on file   Years of education: Not on file   Highest education  level: Not on file  Occupational History   Not on file  Tobacco Use   Smoking status: Never   Smokeless tobacco: Never  Vaping Use   Vaping Use: Never used  Substance and Sexual Activity   Alcohol use: Yes    Comment: occassionally   Drug use: No   Sexual activity: Not on file  Other Topics Concern   Not on file  Social History Narrative   Not on file   Social Determinants of Health   Financial Resource Strain: Not on file  Food Insecurity: Not on file  Transportation Needs: Not on file  Physical Activity: Not on file  Stress: Not on file  Social Connections: Not on file  Intimate Partner Violence: Not on file     Physical Exam   Vitals:   10/29/20 0515  BP: 125/81  Pulse: 79  Resp: 19  Temp: 97.8 F (36.6 C)  SpO2: 99%    CONSTITUTIONAL: Well-appearing, NAD NEURO:  Alert and oriented x 3, no focal deficits EYES:  eyes equal and reactive ENT/NECK:  no LAD, no JVD CARDIO: Regular rate, well-perfused, normal S1 and S2 PULM:  CTAB no wheezing or rhonchi GI/GU:  normal bowel sounds, non-distended, non-tender MSK/SPINE:  No gross deformities, no  edema SKIN:  no rash, atraumatic PSYCH:  Appropriate speech and behavior  *Additional and/or pertinent findings included in MDM below  Diagnostic and Interventional Summary    EKG Interpretation  Date/Time:    Ventricular Rate:    PR Interval:    QRS Duration:   QT Interval:    QTC Calculation:   R Axis:     Text Interpretation:         Labs Reviewed - No data to display  No orders to display    Medications  oxyCODONE (Oxy IR/ROXICODONE) immediate release tablet 5 mg (has no administration in time range)     Procedures  /  Critical Care Procedures  ED Course and Medical Decision Making  I have reviewed the triage vital signs, the nursing notes, and pertinent available records from the EMR.  Listed above are laboratory and imaging tests that I personally ordered, reviewed, and interpreted and then  considered in my medical decision making (see below for details).  The knee overall is well-appearing, there is some minimal edema, possibly a small joint effusion.  There is no increased warmth, there is no erythema, patient has no fever, there is preserved range of motion, highly doubt septic joint.  Patient has a documented history of hereditary hemorrhagic telangiectasia, mostly involving complicated nosebleeds in the past.  The literature does not show any link between this condition and spontaneous hemarthrosis.  There is also documentation of a history of hemophilia, however I feel this is an error.  She has had no transfusions or significant bleeding episodes in over a decade, she does not follow with a hematologist, she takes no medications for this condition, and she has never had formal testing to confirm this diagnosis.  She had abnormal uterine bleeding in the past that required hysterectomy and she has had nosebleeds but denies any other significant bleeding episodes.  Given this and the exam I highly doubt hemarthrosis, pain seems best explained by a flare of chronic arthritis and/or old injury related to the MCL tear.  Patient has follow-up with orthopedics this week, appropriate for discharge with augmented pain control at home.       Elmer Sow. Pilar Plate, MD [facility] Emergency Medicine [facility] [email or web address]  Final Clinical Impressions(s) / ED Diagnoses     ICD-10-CM   1. Left knee pain, unspecified chronicity  M25.562       ED Discharge Orders          Ordered    oxyCODONE (ROXICODONE) 5 MG immediate release tablet  Every 4 hours PRN        10/29/20 0528             Discharge Instructions Discussed with and Provided to Patient:    Discharge Instructions      You were evaluated in the Emergency Department and after careful evaluation, we did not find any emergent condition requiring admission or further testing in the hospital.  Your  exam/testing today was overall reassuring.  Symptoms seem to be related to either arthritis or exacerbation of pain related to prior injury.  Recommend close follow-up with the orthopedic specialist, continue taking the meloxicam, can use the oxycodone provided for more significant pain.  Please return to the Emergency Department if you experience any worsening of your condition.  Thank you for allowing [place] to be a part of your care.        Sabas Sous, MD 10/29/20 [phone]

## 2020-10-29 NOTE — ED Triage Notes (Signed)
Pt states she woke yesterday with knee swelling and pain. Remote hx of MCL injury, but no new injury. Pt is ambulatory with limp. States she has taken Meloxicam given to her by her PCP yesterday and Ibuprofen without relief.

## 2020-10-29 NOTE — Patient Instructions (Signed)
Nice to meet you Please try ice  Please try the exercises   Please send me a message in MyChart with any questions or updates.  Please see me back in 6 weeks.   --Dr. Jordan Likes

## 2020-10-29 NOTE — ED Notes (Signed)
EDP at bedside  

## 2020-10-29 NOTE — Discharge Instructions (Addendum)
You were evaluated in the Emergency Department and after careful evaluation, we did not find any emergent condition requiring admission or further testing in the hospital.  Your exam/testing today was overall reassuring.  Symptoms seem to be related to either arthritis or exacerbation of pain related to prior injury.  Recommend close follow-up with the orthopedic specialist, continue taking the meloxicam, can use the oxycodone provided for more significant pain.  Please return to the Emergency Department if you experience any worsening of your condition.  Thank you for allowing [place] to be a part of your care.

## 2020-10-29 NOTE — Progress Notes (Signed)
Tanya Herrera - 35 y.o. female MRN [id number]  Date of birth: January 07, 1986  SUBJECTIVE:  Including CC & ROS.  No chief complaint on file.   Tanya Herrera is a 35 y.o. female that is presenting with acute left knee pain.  No injury or inciting event.  Has limited range of motion.  Unable to bear weight without significant pain.  No history of similar pain..    Review of Systems See HPI   HISTORY: Past Medical, Surgical, Social, and Family History Reviewed & Updated per EMR.   Pertinent Historical Findings include:  Past Medical History:  Diagnosis Date   Asthma    Family history of adverse reaction to anesthesia    H/O transfusion of packed red blood cells    Hemophilia (HCC)    HHT (hereditary hemorrhagic telangiectasia) (HCC)    Hypertension    Insomnia    Major depressive disorder    Panic disorder    PTSD (post-traumatic stress disorder)     Past Surgical History:  Procedure Laterality Date   ABDOMINAL HYSTERECTOMY     ABDOMINAL SURGERY     CESAREAN SECTION     x 3   DILATION AND CURETTAGE OF UTERUS     ENDOMETRIAL ABLATION     NOSE SURGERY     TUBAL LIGATION      No family history on file.  Social History   Socioeconomic History   Marital status: Married    Spouse name: Not on file   Number of children: Not on file   Years of education: Not on file   Highest education level: Not on file  Occupational History   Not on file  Tobacco Use   Smoking status: Never   Smokeless tobacco: Never  Vaping Use   Vaping Use: Never used  Substance and Sexual Activity   Alcohol use: Yes    Comment: occassionally   Drug use: No   Sexual activity: Not on file  Other Topics Concern   Not on file  Social History Narrative   Not on file   Social Determinants of Health   Financial Resource Strain: Not on file  Food Insecurity: Not on file  Transportation Needs: Not on file  Physical Activity: Not on file  Stress: Not on file  Social Connections: Not on file   Intimate Partner Violence: Not on file     PHYSICAL EXAM:  VS: Ht 5\' 7"  (1.702 m)   Wt 239 lb (108.4 kg)   LMP 10/19/2015   BMI 37.43 kg/m  Physical Exam Gen: NAD, alert, cooperative with exam, well-appearing MSK:  Left knee: Obvious effusion. Limited extension and flexion. Normal strength resistance. Neurovascular intact  Limited ultrasound: Left knee:  Trace effusion. Normal-appearing quadricep and patellar tendon. Normal-appearing medial joint space and lateral joint space  Summary: Trace effusion  Ultrasound and interpretation by 10/21/2015, MD   Aspiration/Injection Procedure Note Clare Gandy 1985-10-08  Procedure: Injection Indications: Left knee pain  Procedure Details Consent: Risks of procedure as well as the alternatives and risks of each were explained to the (patient/caregiver).  Consent for procedure obtained. Time Out: Verified patient identification, verified procedure, site/side was marked, verified correct patient position, special equipment/implants available, medications/allergies/relevent history reviewed, required imaging and test results available.  Performed.  The area was cleaned with iodine and alcohol swabs.    The left knee superior lateral suprapatellar pouch was injected using 3 cc of 1% lidocaine on a 21-gauge 1-1/2 inch needle.  The syringe was switched  and a mixture containing 1 cc's of 40 mg Kenalog and 4 cc's of 0.25% bupivacaine was injected.  Ultrasound was used. Images were obtained in long views showing the injection.     A sterile dressing was applied.  Patient did tolerate procedure well.    ASSESSMENT & PLAN:   Patellofemoral pain syndrome of left knee Seems most likely is patellofemoral in nature.  She does demonstrate inflammatory type pains in her wrists but no significant synovitis appreciated on exam today. -Counseled on home exercise therapy and supportive care. -Injection today. -Could consider further  imaging or physical therapy.

## 2020-10-29 NOTE — ED Notes (Signed)
ED Provider at bedside. 

## 2020-10-30 DIAGNOSIS — M222X2 Patellofemoral disorders, left knee: Secondary | ICD-10-CM | POA: Insufficient documentation

## 2020-10-30 NOTE — Assessment & Plan Note (Signed)
Seems most likely is patellofemoral in nature.  She does demonstrate inflammatory type pains in her wrists but no significant synovitis appreciated on exam today. -Counseled on home exercise therapy and supportive care. -Injection today. -Could consider further imaging or physical therapy.

## 2020-10-31 ENCOUNTER — Ambulatory Visit: Payer: Self-pay | Admitting: *Deleted

## 2020-10-31 ENCOUNTER — Encounter (HOSPITAL_BASED_OUTPATIENT_CLINIC_OR_DEPARTMENT_OTHER): Payer: Self-pay

## 2020-10-31 ENCOUNTER — Emergency Department (HOSPITAL_BASED_OUTPATIENT_CLINIC_OR_DEPARTMENT_OTHER): Payer: Medicaid Other

## 2020-10-31 ENCOUNTER — Emergency Department (HOSPITAL_BASED_OUTPATIENT_CLINIC_OR_DEPARTMENT_OTHER)
Admission: EM | Admit: 2020-10-31 | Discharge: 2020-10-31 | Disposition: A | Discharge status: Home / Self Care | Payer: Medicaid Other | Attending: Emergency Medicine | Admitting: Emergency Medicine

## 2020-10-31 ENCOUNTER — Other Ambulatory Visit: Payer: Self-pay

## 2020-10-31 DIAGNOSIS — J45909 Unspecified asthma, uncomplicated: Secondary | ICD-10-CM | POA: Insufficient documentation

## 2020-10-31 DIAGNOSIS — I10 Essential (primary) hypertension: Secondary | ICD-10-CM | POA: Diagnosis not present

## 2020-10-31 DIAGNOSIS — Z79899 Other long term (current) drug therapy: Secondary | ICD-10-CM | POA: Diagnosis not present

## 2020-10-31 DIAGNOSIS — Z91040 Latex allergy status: Secondary | ICD-10-CM | POA: Diagnosis not present

## 2020-10-31 DIAGNOSIS — M25562 Pain in left knee: Secondary | ICD-10-CM

## 2020-10-31 MED ORDER — OXYCODONE HCL 5 MG PO TABS: 5.0000 mg | ORAL_TABLET | Freq: Four times a day (QID) | ORAL | 0 refills | Status: DC | PRN

## 2020-10-31 MED ORDER — OXYCODONE-ACETAMINOPHEN 5-325 MG PO TABS
1.0000 | ORAL_TABLET | Freq: Once | ORAL | Status: AC
  Administered 2020-10-31: 1 via ORAL
  Filled 2020-10-31: qty 1

## 2020-10-31 NOTE — Telephone Encounter (Signed)
C/o severe pain in left knee. Patient has seen and treated orthopedic specialist for left knee pain. Stood up out of bed this am and left knee buckled and throughout day increase swelling, redness and warmth noted. Patient reports she is unable to bear weight on left leg . Pain not relieved with ibuprofen . Patient has been keeping left knee elevated and not change in pain level. Encouraged patient to contact PCP or ortho or go to UC or ED due to severe pain. Care advise given. Patient verbalized understanding of care advise and to call back or go to [facility] or ED if symptoms worsen.

## 2020-10-31 NOTE — Discharge Instructions (Signed)
Your x-rays did not show any new fracture or dislocation.  Follow-up with your orthopedic specialist within the week.

## 2020-10-31 NOTE — ED Notes (Signed)
Pt teaching provided on medications that may cause drowsiness. Pt instructed not to drive or operate heavy machinery while taking the prescribed medication. Pt verbalized understanding.  ? ?Pt provided discharge instructions and prescription information. Pt was given the opportunity to ask questions and questions were answered. Discharge signature not obtained in the setting of the COVID-19 pandemic in order to reduce high touch surfaces.  ? ?

## 2020-10-31 NOTE — ED Provider Notes (Signed)
[facility] EMERGENCY DEPARTMENT Provider Note   CSN: [id number] Arrival date & time: 10/31/20  1935     History Chief Complaint  Patient presents with   Knee Pain    Tanya Herrera is a 35 y.o. female.  Patient with history of hereditary hemorrhagic telangiectasia and ongoing left knee pain, presents with exacerbation of left knee pain today.  She states that she was walking when her left knee buckled causing increased pain.  She states she also ran out of her pain medication at home yesterday.  She denies fall as she states she was able to catch her self.  Denies fevers or cough or vomiting or diarrhea.  She has been seen in the ER just a few days ago for her pain as well as seeing orthopedic surgery and has an outpatient MRI scheduled within the next month.        Past Medical History:  Diagnosis Date   Asthma    Family history of adverse reaction to anesthesia    H/O transfusion of packed red blood cells    Hemophilia (HCC)    HHT (hereditary hemorrhagic telangiectasia) (HCC)    Hypertension    Insomnia    Major depressive disorder    Panic disorder    PTSD (post-traumatic stress disorder)     Patient Active Problem List   Diagnosis Date Noted   Patellofemoral pain syndrome of left knee 10/30/2020   Arthralgia of left wrist 10/24/2019   MDD (major depressive disorder), severe (HCC) 04/09/2018   Asthma 01/25/2015   Gastro-esophageal reflux disease without esophagitis 01/25/2015   Obesity with body mass index 30 or greater 08/04/2014   Allergic rhinitis due to pollen 06/23/2014   Anxiety 07/27/2013   Essential (primary) hypertension 07/27/2013   Anemia 06/28/2013   Major depressive disorder, single episode, unspecified 11/26/2011   Migraine without aura 07/16/2011    Past Surgical History:  Procedure Laterality Date   ABDOMINAL HYSTERECTOMY     ABDOMINAL SURGERY     CESAREAN SECTION     x 3   DILATION AND CURETTAGE OF UTERUS     ENDOMETRIAL  ABLATION     NOSE SURGERY     TUBAL LIGATION       OB History   No obstetric history on file.     No family history on file.  Social History   Tobacco Use   Smoking status: Never   Smokeless tobacco: Never  Vaping Use   Vaping Use: Never used  Substance Use Topics   Alcohol use: Yes    Comment: occassionally   Drug use: No    Home Medications Prior to Admission medications   Medication Sig Start Date End Date Taking? Authorizing Provider  albuterol (VENTOLIN HFA) 108 (90 Base) MCG/ACT inhaler Inhale 1-2 puffs into the lungs every 6 (six) hours as needed for wheezing or shortness of breath. 03/21/20   Pisciotta, Joni Reining, PA-C  benzonatate (TESSALON) 100 MG capsule Take 1 capsule (100 mg total) by mouth 3 (three) times daily as needed for cough. 11/03/19   Placido Sou, PA-C  clonazePAM (KLONOPIN) 1 MG tablet Take 1 mg by mouth at bedtime as needed for anxiety.  01/04/18   [provider]  diclofenac Sodium (VOLTAREN) 1 % GEL Apply 2 g topically 4 (four) times daily as needed. 10/12/19   Long, Arlyss Repress, MD  diphenhydrAMINE (BENADRYL) 25 mg capsule Take 1 capsule (25 mg total) by mouth every 6 (six) hours as needed for itching. 09/04/18  [place], Alvira Philips, DO  famotidine (PEPCID) 20 MG tablet Take 1 tablet (20 mg total) by mouth 2 (two) times daily. 08/21/19   Renne Crigler, PA-C  FLUoxetine (PROZAC) 40 MG capsule Take 80 mg by mouth daily.  03/23/18   [provider]  hydrOXYzine (ATARAX/VISTARIL) 50 MG tablet TAKE 1 TABLET BY MOUTH EVERY 6 HOURS AS NEEDED FOR ANXIETY 03/21/20 03/21/21  Pisciotta, Joni Reining, PA-C  ibuprofen (ADVIL) 600 MG tablet Take 1 tablet (600 mg total) by mouth every 6 (six) hours as needed. 08/13/20   Gailen Shelter, PA  ondansetron (ZOFRAN ODT) 4 MG disintegrating tablet Take 1 tablet (4 mg total) by mouth every 8 (eight) hours as needed for nausea or vomiting. 08/21/19   Renne Crigler, PA-C  oxyCODONE (ROXICODONE) 5 MG immediate release  tablet Take 1 tablet (5 mg total) by mouth every 6 (six) hours as needed for up to 8 doses for severe pain. 10/31/20   Cheryll Cockayne, MD  pantoprazole (PROTONIX) 20 MG tablet Take 1 tablet (20 mg total) by mouth daily. 08/21/19   Renne Crigler, PA-C  sucralfate (CARAFATE) 1 g tablet Take 1 tablet (1 g total) by mouth 4 (four) times daily -  with meals and at bedtime. 08/21/19   Renne Crigler, PA-C  traZODone (DESYREL) 50 MG tablet Take 100 mg by mouth at bedtime.  03/25/18   [provider]    Allergies    Iron, Iron dextran, Latex, Banana, and Penicillins  Review of Systems   Review of Systems  Constitutional:  Negative for fever.  HENT:  Negative for ear pain.   Eyes:  Negative for pain.  Respiratory:  Negative for cough.   Cardiovascular:  Negative for chest pain.  Gastrointestinal:  Negative for abdominal pain.  Genitourinary:  Negative for flank pain.  Musculoskeletal:  Negative for back pain.  Skin:  Negative for rash.  Neurological:  Negative for headaches.   Physical Exam Updated Vital Signs BP 134/88 (BP Location: Right Arm)   Pulse 92   Temp 98.5 F (36.9 C) (Oral)   Resp 20   LMP 10/19/2015   SpO2 98%   Physical Exam Constitutional:      General: She is not in acute distress.    Appearance: Normal appearance.  HENT:     Head: Normocephalic.     Nose: Nose normal.  Eyes:     Extraocular Movements: Extraocular movements intact.  Cardiovascular:     Rate and Rhythm: Normal rate.  Pulmonary:     Effort: Pulmonary effort is normal.  Musculoskeletal:     Cervical back: Normal range of motion.     Comments: Mild swelling seen in the left knee.  No gross deformity noted.  Tenderness along the medial and lateral aspects of the left knee.  Normal range of motion noted.  Neurovascularly intact distally.  Neurological:     General: No focal deficit present.     Mental Status: She is alert. Mental status is at baseline.    ED Results / Procedures / Treatments    Labs (all labs ordered are listed, but only abnormal results are displayed) Labs Reviewed - No data to display  EKG None  Radiology DG Knee 2 Views Left  Result Date: 10/31/2020 CLINICAL DATA:  Pain EXAM: LEFT KNEE - 1-2 VIEW COMPARISON:  None. FINDINGS: No acute bony abnormality. Specifically, no fracture, subluxation, or dislocation. Corticated fabella posteriorly, normal variant. Trace suprapatellar fluid may be within physiologic normal. No significant soft tissue  swelling, gas or foreign body. IMPRESSION: Trace suprapatellar fluid, possibly within physiologic normal. No other acute osseous or soft tissue abnormality. Electronically Signed   By: Kreg Shropshire M.D.   On: 10/31/2020 20:59    Procedures Procedures   Medications Ordered in ED Medications  oxyCODONE-acetaminophen (PERCOCET/ROXICET) 5-325 MG per tablet 1 tablet (1 tablet Oral Given 10/31/20 2035)    ED Course  I have reviewed the triage vital signs and the nursing notes.  Pertinent labs & imaging results that were available during my care of the patient were reviewed by me and considered in my medical decision making (see chart for details).    MDM Rules/Calculators/A&P                           Given the patient had her left knee buckled today repeat x-rays were performed showing trace fluid but no bony abnormality noted per radiology.  Patient replaced back in her knee brace and crutches which she brought.  Given refill of her pain medications.  Advise close follow-up in 2 to 3 days with orthopedic team within the week. Final Clinical Impression(s) / ED Diagnoses Final diagnoses:  Acute pain of left knee    Rx / DC Orders ED Discharge Orders          Ordered    oxyCODONE (ROXICODONE) 5 MG immediate release tablet  Every 6 hours PRN        10/31/20 2116             Cheryll Cockayne, MD 10/31/20 2116

## 2020-10-31 NOTE — Telephone Encounter (Signed)
Reason for Disposition  [1] SEVERE pain (e.g., excruciating, unable to walk) AND [2] not improved after 2 hours of pain medicine  Answer Assessment - Initial Assessment Questions 1. LOCATION and RADIATION: "Where is the pain located?"      Left knee 2. QUALITY: "What does the pain feel like?"  (e.g., sharp, dull, aching, burning)     Sharp aching and burning  3. SEVERITY: "How bad is the pain?" "What does it keep you from doing?"   (Scale 1-10; or mild, moderate, severe)   -  MILD (1-3): doesn't interfere with normal activities    -  MODERATE (4-7): interferes with normal activities (e.g., work or school) or awakens from sleep, limping    -  SEVERE (8-10): excruciating pain, unable to do any normal activities, unable to walk     Moderate to severe unable to walk 4. ONSET: "When did the pain start?" "Does it come and go, or is it there all the time?"     This am  stood up out of bed and left knee buckled  5. RECURRENT: "Have you had this pain before?" If Yes, ask: "When, and what happened then?"     Yes has seen orthopedic 6. SETTING: "Has there been any recent work, exercise or other activity that involved that part of the body?"      Stood up out of bed this am and knee buckled 7. AGGRAVATING FACTORS: "What makes the knee pain worse?" (e.g., walking, climbing stairs, running)     Bearing weight on left leg 8. ASSOCIATED SYMPTOMS: "Is there any swelling or redness of the knee?"     Swelling and redness and warmth  9. OTHER SYMPTOMS: "Do you have any other symptoms?" (e.g., chest pain, difficulty breathing, fever, calf pain)     no 10. PREGNANCY: "Is there any chance you are pregnant?" "When was your last menstrual period?"       na  Protocols used: Knee Pain-A-AH

## 2020-10-31 NOTE — ED Triage Notes (Addendum)
Pt c/o pain/swelling to left knee x 3 days-denies injury-seen at UC 3 days ago-had xray and rx-seen here and by ortho 8/8-states "he put numbing medicine and steroids in there"-states she is to have MRI in 6 weeks-NAD-to triage in w/c

## 2020-11-12 ENCOUNTER — Other Ambulatory Visit: Payer: Self-pay | Admitting: Physician Assistant

## 2020-11-12 DIAGNOSIS — G8929 Other chronic pain: Secondary | ICD-10-CM

## 2020-11-12 DIAGNOSIS — M25562 Pain in left knee: Secondary | ICD-10-CM

## 2020-11-14 ENCOUNTER — Inpatient Hospital Stay: Admission: RE | Admit: 2020-11-14 | Payer: Medicaid Other | Source: Ambulatory Visit

## 2020-12-10 ENCOUNTER — Ambulatory Visit: Payer: Medicaid Other | Admitting: Family Medicine

## 2020-12-23 ENCOUNTER — Emergency Department (HOSPITAL_BASED_OUTPATIENT_CLINIC_OR_DEPARTMENT_OTHER)
Admission: EM | Admit: 2020-12-23 | Discharge: 2020-12-23 | Disposition: A | Discharge status: Home / Self Care | Payer: Medicaid Other | Attending: Emergency Medicine | Admitting: Emergency Medicine

## 2020-12-23 ENCOUNTER — Other Ambulatory Visit: Payer: Self-pay

## 2020-12-23 ENCOUNTER — Emergency Department (HOSPITAL_BASED_OUTPATIENT_CLINIC_OR_DEPARTMENT_OTHER): Payer: Medicaid Other

## 2020-12-23 ENCOUNTER — Encounter (HOSPITAL_BASED_OUTPATIENT_CLINIC_OR_DEPARTMENT_OTHER): Payer: Self-pay | Admitting: Emergency Medicine

## 2020-12-23 DIAGNOSIS — N760 Acute vaginitis: Secondary | ICD-10-CM | POA: Insufficient documentation

## 2020-12-23 DIAGNOSIS — N898 Other specified noninflammatory disorders of vagina: Secondary | ICD-10-CM | POA: Diagnosis present

## 2020-12-23 DIAGNOSIS — I10 Essential (primary) hypertension: Secondary | ICD-10-CM | POA: Insufficient documentation

## 2020-12-23 DIAGNOSIS — B9689 Other specified bacterial agents as the cause of diseases classified elsewhere: Secondary | ICD-10-CM | POA: Diagnosis not present

## 2020-12-23 DIAGNOSIS — Z91040 Latex allergy status: Secondary | ICD-10-CM | POA: Diagnosis not present

## 2020-12-23 DIAGNOSIS — J45909 Unspecified asthma, uncomplicated: Secondary | ICD-10-CM | POA: Diagnosis not present

## 2020-12-23 DIAGNOSIS — N83201 Unspecified ovarian cyst, right side: Secondary | ICD-10-CM | POA: Diagnosis not present

## 2020-12-23 LAB — COMPREHENSIVE METABOLIC PANEL
ALT: 28 U/L (ref 0–44)
AST: 32 U/L (ref 15–41)
Albumin: 4.4 g/dL (ref 3.5–5.0)
Alkaline Phosphatase: 97 U/L (ref 38–126)
Anion gap: 11 (ref 5–15)
BUN: 7 mg/dL (ref 6–20)
CO2: 23 mmol/L (ref 22–32)
Calcium: 9.2 mg/dL (ref 8.9–10.3)
Chloride: 103 mmol/L (ref 98–111)
Creatinine, Ser: 0.94 mg/dL (ref 0.44–1.00)
GFR, Estimated: >60 mL/min (ref >60)
Glucose, Bld: 82 mg/dL (ref 70–99)
Potassium: 3.7 mmol/L (ref 3.5–5.1)
Sodium: 137 mmol/L (ref 135–145)
Total Bilirubin: 0.6 mg/dL (ref 0.3–1.2)
Total Protein: 8.3 g/dL — ABNORMAL HIGH (ref 6.5–8.1)

## 2020-12-23 LAB — URINALYSIS, MICROSCOPIC (REFLEX)

## 2020-12-23 LAB — CBC WITH DIFFERENTIAL/PLATELET
Abs Immature Granulocytes: 0.01 10*3/uL (ref 0.00–0.07)
Basophils Absolute: 0 10*3/uL (ref 0.0–0.1)
Basophils Relative: 1 %
Eosinophils Absolute: 0.2 10*3/uL (ref 0.0–0.5)
Eosinophils Relative: 3 %
HCT: 39 % (ref 36.0–46.0)
Hemoglobin: 13.1 g/dL (ref 12.0–15.0)
Immature Granulocytes: 0 %
Lymphocytes Relative: 30 %
Lymphs Abs: 2.2 10*3/uL (ref 0.7–4.0)
MCH: 25.8 pg — ABNORMAL LOW (ref 26.0–34.0)
MCHC: 33.6 g/dL (ref 30.0–36.0)
MCV: 76.8 fL — ABNORMAL LOW (ref 80.0–100.0)
Monocytes Absolute: 0.5 10*3/uL (ref 0.1–1.0)
Monocytes Relative: 7 %
Neutro Abs: 4.4 10*3/uL (ref 1.7–7.7)
Neutrophils Relative %: 59 %
Platelets: 266 10*3/uL (ref 150–400)
RBC: 5.08 MIL/uL (ref 3.87–5.11)
RDW: 16.2 % — ABNORMAL HIGH (ref 11.5–15.5)
WBC: 7.4 10*3/uL (ref 4.0–10.5)
nRBC: 0 % (ref 0.0–0.2)

## 2020-12-23 LAB — WET PREP, GENITAL
Trich, Wet Prep: NONE SEEN
WBC, Wet Prep HPF POC: NONE SEEN
Yeast Wet Prep HPF POC: NONE SEEN

## 2020-12-23 LAB — URINALYSIS, ROUTINE W REFLEX MICROSCOPIC
Bilirubin Urine: NEGATIVE
Glucose, UA: NEGATIVE mg/dL
Ketones, ur: NEGATIVE mg/dL
Leukocytes,Ua: NEGATIVE
Nitrite: NEGATIVE
Protein, ur: NEGATIVE mg/dL
Specific Gravity, Urine: 1.03 (ref 1.005–1.030)
pH: 6 (ref 5.0–8.0)

## 2020-12-23 MED ORDER — ONDANSETRON 4 MG PO TBDP: 4.0000 mg | ORAL_TABLET | Freq: Three times a day (TID) | ORAL | 0 refills | Status: DC | PRN

## 2020-12-23 MED ORDER — IOHEXOL 350 MG/ML SOLN
100.0000 mL | Freq: Once | INTRAVENOUS | Status: AC | PRN
  Administered 2020-12-23: 85 mL via INTRAVENOUS

## 2020-12-23 MED ORDER — MORPHINE SULFATE (PF) 4 MG/ML IV SOLN
4.0000 mg | Freq: Once | INTRAVENOUS | Status: AC
  Administered 2020-12-23: 4 mg via INTRAVENOUS
  Filled 2020-12-23: qty 1

## 2020-12-23 MED ORDER — METRONIDAZOLE 500 MG PO TABS: 500.0000 mg | ORAL_TABLET | Freq: Two times a day (BID) | ORAL | 0 refills | Status: DC

## 2020-12-23 MED ORDER — HYDROMORPHONE HCL 1 MG/ML IJ SOLN
1.0000 mg | Freq: Once | INTRAMUSCULAR | Status: AC
Start: 2020-12-23 — End: 2020-12-23
  Administered 2020-12-23: 1 mg via INTRAVENOUS
  Filled 2020-12-23: qty 1

## 2020-12-23 MED ORDER — OXYCODONE-ACETAMINOPHEN 5-325 MG PO TABS: 1.0000 | ORAL_TABLET | Freq: Four times a day (QID) | ORAL | 0 refills | Status: DC | PRN

## 2020-12-23 MED ORDER — MORPHINE SULFATE (PF) 4 MG/ML IV SOLN
4.0000 mg | Freq: Once | INTRAVENOUS | Status: AC
Start: 2020-12-23 — End: 2020-12-23
  Administered 2020-12-23: 4 mg via INTRAVENOUS
  Filled 2020-12-23: qty 1

## 2020-12-23 MED ORDER — ONDANSETRON HCL 4 MG/2ML IJ SOLN
4.0000 mg | Freq: Once | INTRAMUSCULAR | Status: AC
  Administered 2020-12-23: 4 mg via INTRAVENOUS
  Filled 2020-12-23: qty 2

## 2020-12-23 MED ORDER — METRONIDAZOLE 500 MG PO TABS
500.0000 mg | ORAL_TABLET | Freq: Two times a day (BID) | ORAL | 0 refills | Status: DC
  Filled 2020-12-23: qty 14, 7d supply, fill #0

## 2020-12-23 MED ORDER — SODIUM CHLORIDE 0.9 % IV BOLUS
500.0000 mL | Freq: Once | INTRAVENOUS | Status: AC
Start: 2020-12-23 — End: 2020-12-23
  Administered 2020-12-23: 500 mL via INTRAVENOUS

## 2020-12-23 NOTE — Discharge Instructions (Addendum)
Take Flagyl twice daily for the next 7 days to cure the bacterial vaginosis. Take the pain medicine as needed for breakthrough pain, you can also take it with Zofran to make sure you do not get nauseated. Follow-up with your OBG later this week for additional evaluation.  If things change or worsen return back to the ED.

## 2020-12-23 NOTE — ED Notes (Signed)
Patient transported to [facility] 

## 2020-12-23 NOTE — ED Triage Notes (Signed)
Pt reports pain to RT pelvic area; brown vaginal discharge

## 2020-12-23 NOTE — ED Provider Notes (Signed)
[facility] EMERGENCY DEPARTMENT Provider Note   CSN: [id number] Arrival date & time: 12/23/20  1334     History Chief Complaint  Patient presents with   Vaginal Discharge    Tanya Herrera is a 35 y.o. female.   Vaginal Discharge Associated symptoms: abdominal pain and nausea   Associated symptoms: no dysuria, no fever and no vomiting     Patient S/P hysterectomy presents with right pelvic pain x 2 weeks. Constant, sharp pain.  Pain is worsened by movement, but also occurs at rest.  It is been constant since it started, it started worsening and had reached an increase severity today which prompted her to come to the ED.  There is associated nausea but no vomiting.  She also been having intermittent brown vaginal discharge since it started.  She tried over-the-counter pain medicine without any relief.  No fevers, hematuria, dysuria, change in bowel habits.  Past Medical History:  Diagnosis Date   Asthma    Family history of adverse reaction to anesthesia    H/O transfusion of packed red blood cells    Hemophilia (HCC)    HHT (hereditary hemorrhagic telangiectasia) (HCC)    Hypertension    Insomnia    Major depressive disorder    Panic disorder    PTSD (post-traumatic stress disorder)     Patient Active Problem List   Diagnosis Date Noted   Patellofemoral pain syndrome of left knee 10/30/2020   Arthralgia of left wrist 10/24/2019   MDD (major depressive disorder), severe (HCC) 04/09/2018   Asthma 01/25/2015   Gastro-esophageal reflux disease without esophagitis 01/25/2015   Obesity with body mass index 30 or greater 08/04/2014   Allergic rhinitis due to pollen 06/23/2014   Anxiety 07/27/2013   Essential (primary) hypertension 07/27/2013   Anemia 06/28/2013   Major depressive disorder, single episode, unspecified 11/26/2011   Migraine without aura 07/16/2011    Past Surgical History:  Procedure Laterality Date   ABDOMINAL HYSTERECTOMY     ABDOMINAL  SURGERY     CESAREAN SECTION     x 3   DILATION AND CURETTAGE OF UTERUS     ENDOMETRIAL ABLATION     NOSE SURGERY     TUBAL LIGATION       OB History   No obstetric history on file.     No family history on file.  Social History   Tobacco Use   Smoking status: Never   Smokeless tobacco: Never  Vaping Use   Vaping Use: Never used  Substance Use Topics   Alcohol use: Yes    Comment: occassionally   Drug use: No    Home Medications Prior to Admission medications   Medication Sig Start Date End Date Taking? Authorizing Provider  ondansetron (ZOFRAN ODT) 4 MG disintegrating tablet Take 1 tablet (4 mg total) by mouth every 8 (eight) hours as needed for nausea or vomiting. 12/23/20  Yes Theron Arista, PA-C  oxyCODONE-acetaminophen (PERCOCET/ROXICET) 5-325 MG tablet Take 1 tablet by mouth every 6 (six) hours as needed for severe pain. 12/23/20  Yes Theron Arista, PA-C  albuterol (VENTOLIN HFA) 108 (90 Base) MCG/ACT inhaler Inhale 1-2 puffs into the lungs every 6 (six) hours as needed for wheezing or shortness of breath. 03/21/20   Pisciotta, Joni Reining, PA-C  benzonatate (TESSALON) 100 MG capsule Take 1 capsule (100 mg total) by mouth 3 (three) times daily as needed for cough. 11/03/19   Placido Sou, PA-C  clonazePAM (KLONOPIN) 1 MG tablet Take 1 mg by  mouth at bedtime as needed for anxiety.  01/04/18   [provider]  diclofenac Sodium (VOLTAREN) 1 % GEL Apply 2 g topically 4 (four) times daily as needed. 10/12/19   Long, Arlyss Repress, MD  diphenhydrAMINE (BENADRYL) 25 mg capsule Take 1 capsule (25 mg total) by mouth every 6 (six) hours as needed for itching. 09/04/18   [place], Alvira Philips, DO  famotidine (PEPCID) 20 MG tablet Take 1 tablet (20 mg total) by mouth 2 (two) times daily. 08/21/19   Renne Crigler, PA-C  FLUoxetine (PROZAC) 40 MG capsule Take 80 mg by mouth daily.  03/23/18   [provider]  hydrOXYzine (ATARAX/VISTARIL) 50 MG tablet TAKE 1 TABLET BY MOUTH EVERY 6  HOURS AS NEEDED FOR ANXIETY 03/21/20 03/21/21  Pisciotta, Joni Reining, PA-C  ibuprofen (ADVIL) 600 MG tablet Take 1 tablet (600 mg total) by mouth every 6 (six) hours as needed. 08/13/20   Gailen Shelter, PA  metroNIDAZOLE (FLAGYL) 500 MG tablet Take 1 tablet (500 mg total) by mouth 2 (two) times daily. 12/23/20   Theron Arista, PA-C  oxyCODONE (ROXICODONE) 5 MG immediate release tablet Take 1 tablet (5 mg total) by mouth every 6 (six) hours as needed for up to 8 doses for severe pain. 10/31/20   Cheryll Cockayne, MD  pantoprazole (PROTONIX) 20 MG tablet Take 1 tablet (20 mg total) by mouth daily. 08/21/19   Renne Crigler, PA-C  sucralfate (CARAFATE) 1 g tablet Take 1 tablet (1 g total) by mouth 4 (four) times daily -  with meals and at bedtime. 08/21/19   Renne Crigler, PA-C  traZODone (DESYREL) 50 MG tablet Take 100 mg by mouth at bedtime.  03/25/18   [provider]    Allergies    Iron, Iron dextran, Latex, Banana, and Penicillins  Review of Systems   Review of Systems  Constitutional:  Negative for chills and fever.  HENT:  Negative for ear pain and sore throat.   Eyes:  Negative for pain and visual disturbance.  Respiratory:  Negative for cough and shortness of breath.   Cardiovascular:  Negative for chest pain and palpitations.  Gastrointestinal:  Positive for abdominal pain and nausea. Negative for vomiting.  Genitourinary:  Positive for pelvic pain and vaginal discharge. Negative for dysuria and hematuria.  Musculoskeletal:  Negative for arthralgias and back pain.  Skin:  Negative for color change and rash.  Neurological:  Negative for seizures and syncope.  All other systems reviewed and are negative.  Physical Exam Updated Vital Signs BP 133/86 (BP Location: Right Arm)   Pulse (!) 102   Temp 98.1 F (36.7 C) (Oral)   Resp 16   Ht 5\' 7"  (1.702 m)   Wt 104.3 kg   LMP 10/19/2015   SpO2 100%   BMI 36.02 kg/m   Physical Exam Vitals and nursing note reviewed. Exam conducted  with a chaperone present.  Constitutional:      Appearance: Normal appearance.  HENT:     Head: Normocephalic and atraumatic.  Eyes:     General: No scleral icterus.       Right eye: No discharge.        Left eye: No discharge.     Extraocular Movements: Extraocular movements intact.     Pupils: Pupils are equal, round, and reactive to light.  Cardiovascular:     Rate and Rhythm: Normal rate and regular rhythm.     Pulses: Normal pulses.     Heart sounds: Normal heart sounds.  No murmur heard.   No friction rub. No gallop.  Pulmonary:     Effort: Pulmonary effort is normal. No respiratory distress.     Breath sounds: Normal breath sounds.  Abdominal:     General: Abdomen is flat. Bowel sounds are normal. There is no distension.     Palpations: Abdomen is soft.     Tenderness: There is abdominal tenderness.     Comments: Right lower quadrant abdominal pain, abdomen soft.  Genitourinary:    Comments: No cervix status post hysterectomy.  Patient does have right adnexal tenderness, no left adnexal tenderness.  Opaque discharge, no brown discharge visualized. Skin:    General: Skin is warm and dry.     Coloration: Skin is not jaundiced.  Neurological:     Mental Status: She is alert. Mental status is at baseline.     Coordination: Coordination normal.    ED Results / Procedures / Treatments   Labs (all labs ordered are listed, but only abnormal results are displayed) Labs Reviewed  WET PREP, GENITAL - Abnormal; Notable for the following components:      Result Value   Clue Cells Wet Prep HPF POC PRESENT (*)    All other components within normal limits  URINALYSIS, ROUTINE W REFLEX MICROSCOPIC - Abnormal; Notable for the following components:   Hgb urine dipstick SMALL (*)    All other components within normal limits  URINALYSIS, MICROSCOPIC (REFLEX) - Abnormal; Notable for the following components:   Bacteria, UA MANY (*)    All other components within normal limits   COMPREHENSIVE METABOLIC PANEL - Abnormal; Notable for the following components:   Total Protein 8.3 (*)    All other components within normal limits  CBC WITH DIFFERENTIAL/PLATELET - Abnormal; Notable for the following components:   MCV 76.8 (*)    MCH 25.8 (*)    RDW 16.2 (*)    All other components within normal limits  GC/CHLAMYDIA PROBE AMP ([place]) NOT AT [facility]    EKG None  Radiology CT Abdomen Pelvis W Contrast  Result Date: 12/23/2020 CLINICAL DATA:  Right lower quadrant abdominal pain. EXAM: CT ABDOMEN AND PELVIS WITH CONTRAST TECHNIQUE: Multidetector CT imaging of the abdomen and pelvis was performed using the standard protocol following bolus administration of intravenous contrast. CONTRAST:  7mL OMNIPAQUE IOHEXOL 350 MG/ML SOLN COMPARISON:  CT abdomen and pelvis 07/31/2017. ultrasound abdomen 07/31/2017. CT abdomen pelvis 01/18/2016. FINDINGS: Lower chest: No acute abnormality. Hepatobiliary: No focal liver abnormality is seen. No gallstones, gallbladder wall thickening, or biliary dilatation. Pancreas: Unremarkable. No pancreatic ductal dilatation or surrounding inflammatory changes. Spleen: Peripherally enhancing lesion in the spleen measuring 4 cm is unchanged from 2017 favored as a hemangioma. The spleen is otherwise within normal limits. Adrenals/Urinary Tract: Adrenal glands are unremarkable. Kidneys are normal, without renal calculi, focal lesion, or hydronephrosis. Bladder is unremarkable. Stomach/Bowel: Stomach is within normal limits. Appendix appears normal. No evidence of bowel wall thickening, distention, or inflammatory changes. Vascular/Lymphatic: No significant vascular findings are present. No enlarged abdominal or pelvic lymph nodes. Reproductive: Status post hysterectomy. There is a 2.3 cm cyst in the right ovary. No adnexal masses. Other: No abdominal wall hernia or abnormality. No abdominopelvic ascites. Musculoskeletal: No acute or significant osseous  findings. IMPRESSION: 1. No acute localizing process in the abdomen or pelvis. Normal appendix. 2. 2.3 cm right ovarian simple-appearing cyst. No follow-up imaging is recommended. Reference: JACR 2020 Feb;17(2):248-254 Electronically Signed   By: Darliss Cheney M.D.   On: 12/23/2020  17:47   US PELVIC COMPLETE W TRANSVAGINAL AND TORSION R/O  Result Date: 12/23/2020 CLINICAL DATA:  RIGHT LOWER QUADRANT and RIGHT flank pain for 3 weeks. Recent abdominal distention. Prior hysterectomy. EXAM: TRANSABDOMINAL AND TRANSVAGINAL ULTRASOUND OF PELVIS DOPPLER ULTRASOUND OF OVARIES TECHNIQUE: Both transabdominal and transvaginal ultrasound examinations of the pelvis were performed. Transabdominal technique was performed for global imaging of the pelvis including uterus, ovaries, adnexal regions, and pelvic cul-de-sac. It was necessary to proceed with endovaginal exam following the transabdominal exam to visualize the adnexal regions. Color and duplex Doppler ultrasound was utilized to evaluate blood flow to the ovaries. COMPARISON:  07/30/2017 CT FINDINGS: Uterus Measurements: Surgically absent.  Vaginal cuff is unremarkable. Endometrium Thickness: Surgically absent uterus. Right ovary Measurements: 3.2 x 2.4 x 2.1 centimeters = volume: 8.5 mL. Normal appearance/no adnexal mass. Left ovary Measurements: Not visualized Pulsed Doppler evaluation of the RIGHT ovary demonstrates normal low-resistance arterial and venous waveforms. Other findings Study quality is degraded by overlying bowel gas. IMPRESSION: 1. Status post hysterectomy. 2. Normal appearance of the RIGHT ovary. No evidence for RIGHT ovarian mass or torsion. 3. Nonvisualized LEFT ovary. Electronically Signed   By: Norva Pavlov M.D.   On: 12/23/2020 16:30    Procedures Procedures   Medications Ordered in ED Medications  ondansetron (ZOFRAN) injection 4 mg (4 mg Intravenous Given 12/23/20 1619)  morphine 4 MG/ML injection 4 mg (4 mg Intravenous Given 12/23/20  1620)  sodium chloride 0.9 % bolus 500 mL (0 mLs Intravenous Stopped 12/23/20 1816)  morphine 4 MG/ML injection 4 mg (4 mg Intravenous Given 12/23/20 1701)  iohexol (OMNIPAQUE) 350 MG/ML injection 100 mL (85 mLs Intravenous Contrast Given 12/23/20 1708)  HYDROmorphone (DILAUDID) injection 1 mg (1 mg Intravenous Given 12/23/20 1839)    ED Course  I have reviewed the triage vital signs and the nursing notes.  Pertinent labs & imaging results that were available during my care of the patient were reviewed by me and considered in my medical decision making (see chart for details).    MDM Rules/Calculators/A&P                           Mildly tachycardic on intake, vitals stable. Nontoxic appearing. Pelvic with right adnexal tenderness, will proceed with [place] to assess for TO and ovarian cyst and torsions. Resulted negative, CT abdomen to rule out appendicitis. Negative. Right ovarian cyst located. Wet prep notable for BV. No UTI on UA, but small hgb. No evidence of kidney stones on CT. No leukocytosis or gross electrolyte derangement. Doubt acute pathology, pain improved and serial abdominal exams have been reassuring and soft with lessoning pain. Will discharge with OBGYN follow up.   Final Clinical Impression(s) / ED Diagnoses Final diagnoses:  BV (bacterial vaginosis)  Right ovarian cyst    Rx / DC Orders ED Discharge Orders          Ordered    metroNIDAZOLE (FLAGYL) 500 MG tablet  2 times daily,   Status:  Discontinued        12/23/20 1656    metroNIDAZOLE (FLAGYL) 500 MG tablet  2 times daily,   Status:  Discontinued        12/23/20 1823    metroNIDAZOLE (FLAGYL) 500 MG tablet  2 times daily        12/23/20 1827    ondansetron (ZOFRAN ODT) 4 MG disintegrating tablet  Every 8 hours PRN        12/23/20 1827  oxyCODONE-acetaminophen (PERCOCET/ROXICET) 5-325 MG tablet  Every 6 hours PRN        12/23/20 1827             Theron Arista, Cordelia Poche 12/23/20 2301    Terrilee Files,  MD 12/24/20 [phone]

## 2020-12-23 NOTE — ED Notes (Signed)
Patient Returned for [place]

## 2020-12-24 ENCOUNTER — Other Ambulatory Visit (HOSPITAL_BASED_OUTPATIENT_CLINIC_OR_DEPARTMENT_OTHER): Payer: Self-pay

## 2020-12-24 LAB — GC/CHLAMYDIA PROBE AMP (~~LOC~~) NOT AT ARMC
Chlamydia: NEGATIVE
Comment: NEGATIVE
Comment: NORMAL
Neisseria Gonorrhea: NEGATIVE

## 2021-01-30 ENCOUNTER — Emergency Department (HOSPITAL_BASED_OUTPATIENT_CLINIC_OR_DEPARTMENT_OTHER): Payer: Medicaid Other

## 2021-01-30 ENCOUNTER — Other Ambulatory Visit: Payer: Self-pay

## 2021-01-30 ENCOUNTER — Encounter (HOSPITAL_BASED_OUTPATIENT_CLINIC_OR_DEPARTMENT_OTHER): Payer: Self-pay

## 2021-01-30 ENCOUNTER — Emergency Department (HOSPITAL_BASED_OUTPATIENT_CLINIC_OR_DEPARTMENT_OTHER)
Admission: EM | Admit: 2021-01-30 | Discharge: 2021-01-31 | Disposition: A | Discharge status: Home / Self Care | Payer: Medicaid Other | Attending: Emergency Medicine | Admitting: Emergency Medicine

## 2021-01-30 DIAGNOSIS — I10 Essential (primary) hypertension: Secondary | ICD-10-CM | POA: Diagnosis not present

## 2021-01-30 DIAGNOSIS — N83201 Unspecified ovarian cyst, right side: Secondary | ICD-10-CM | POA: Diagnosis not present

## 2021-01-30 DIAGNOSIS — Z91040 Latex allergy status: Secondary | ICD-10-CM | POA: Diagnosis not present

## 2021-01-30 DIAGNOSIS — J45909 Unspecified asthma, uncomplicated: Secondary | ICD-10-CM | POA: Insufficient documentation

## 2021-01-30 DIAGNOSIS — R102 Pelvic and perineal pain: Secondary | ICD-10-CM | POA: Diagnosis present

## 2021-01-30 LAB — URINALYSIS, ROUTINE W REFLEX MICROSCOPIC
Bilirubin Urine: NEGATIVE
Glucose, UA: NEGATIVE mg/dL
Ketones, ur: NEGATIVE mg/dL
Leukocytes,Ua: NEGATIVE
Nitrite: NEGATIVE
Protein, ur: NEGATIVE mg/dL
Specific Gravity, Urine: 1.025 (ref 1.005–1.030)
pH: 6.5 (ref 5.0–8.0)

## 2021-01-30 LAB — URINALYSIS, MICROSCOPIC (REFLEX)

## 2021-01-30 LAB — COMPREHENSIVE METABOLIC PANEL
ALT: 23 U/L (ref 0–44)
AST: 27 U/L (ref 15–41)
Albumin: 4.1 g/dL (ref 3.5–5.0)
Alkaline Phosphatase: 83 U/L (ref 38–126)
Anion gap: 9 (ref 5–15)
BUN: 9 mg/dL (ref 6–20)
CO2: 24 mmol/L (ref 22–32)
Calcium: 9.1 mg/dL (ref 8.9–10.3)
Chloride: 101 mmol/L (ref 98–111)
Creatinine, Ser: 0.86 mg/dL (ref 0.44–1.00)
GFR, Estimated: >60 mL/min (ref >60)
Glucose, Bld: 97 mg/dL (ref 70–99)
Potassium: 3.8 mmol/L (ref 3.5–5.1)
Sodium: 134 mmol/L — ABNORMAL LOW (ref 135–145)
Total Bilirubin: 0.2 mg/dL — ABNORMAL LOW (ref 0.3–1.2)
Total Protein: 7.8 g/dL (ref 6.5–8.1)

## 2021-01-30 LAB — CBC
HCT: 38.7 % (ref 36.0–46.0)
Hemoglobin: 12.7 g/dL (ref 12.0–15.0)
MCH: 26 pg (ref 26.0–34.0)
MCHC: 32.8 g/dL (ref 30.0–36.0)
MCV: 79.3 fL — ABNORMAL LOW (ref 80.0–100.0)
Platelets: 302 10*3/uL (ref 150–400)
RBC: 4.88 MIL/uL (ref 3.87–5.11)
RDW: 16.2 % — ABNORMAL HIGH (ref 11.5–15.5)
WBC: 6.5 10*3/uL (ref 4.0–10.5)
nRBC: 0 % (ref 0.0–0.2)

## 2021-01-30 LAB — LIPASE, BLOOD: Lipase: 27 U/L (ref 11–51)

## 2021-01-30 MED ORDER — FENTANYL CITRATE PF 50 MCG/ML IJ SOSY
100.0000 ug | PREFILLED_SYRINGE | Freq: Once | INTRAMUSCULAR | Status: AC
  Administered 2021-01-30: 100 ug via INTRAVENOUS
  Filled 2021-01-30: qty 2

## 2021-01-30 MED ORDER — ONDANSETRON HCL 4 MG/2ML IJ SOLN
4.0000 mg | Freq: Once | INTRAMUSCULAR | Status: AC
  Administered 2021-01-30: 4 mg via INTRAVENOUS
  Filled 2021-01-30: qty 2

## 2021-01-30 NOTE — ED Notes (Signed)
Pt care taken, complaining of pain in the lower rt abdomen, hx of ovarian cyst.

## 2021-01-30 NOTE — ED Provider Notes (Signed)
[facility] Provider Note: Tanya Dell, MD, FACEP  CSN: [id number] MRN: [id number] ARRIVAL: 01/30/21 at 1943 ROOM: [facility]   CHIEF COMPLAINT  Pelvic Pain   HISTORY OF PRESENT ILLNESS  01/30/21 11:33 PM Tanya Herrera is a 35 y.o. female who has had right pelvic pain for "months" that has been especially bad for the past week.  She had a CT scan 12/23/2020 that showed a 2.3 cm right simple ovarian cyst, otherwise unremarkable.  She has been seen by her gynecologist for this 3 times.  She has been treated with tramadol without relief.  She is also taken ibuprofen and Tylenol without relief.  She rates her pain as a 10 out of 10, worse with palpation, movements or Valsalva (urination/defecation).  She denies vaginal bleeding or vaginal pain.  She denies burning with urination.   Past Medical History:  Diagnosis Date   Asthma    Family history of adverse reaction to anesthesia    H/O transfusion of packed red blood cells    Hemophilia (HCC)    HHT (hereditary hemorrhagic telangiectasia) (HCC)    Hypertension    Insomnia    Major depressive disorder    Panic disorder    PTSD (post-traumatic stress disorder)     Past Surgical History:  Procedure Laterality Date   ABDOMINAL HYSTERECTOMY     ABDOMINAL SURGERY     CESAREAN SECTION     x 3   DILATION AND CURETTAGE OF UTERUS     ENDOMETRIAL ABLATION     NOSE SURGERY     TUBAL LIGATION      No family history on file.  Social History   Tobacco Use   Smoking status: Never   Smokeless tobacco: Never  Vaping Use   Vaping Use: Never used  Substance Use Topics   Alcohol use: Yes    Comment: occassionally   Drug use: No    Prior to Admission medications   Medication Sig Start Date End Date Taking? Authorizing Provider  oxyCODONE-acetaminophen (PERCOCET) 5-325 MG tablet Take 1 tablet by mouth every 6 (six) hours as needed for severe pain. 01/31/21  Yes Jasaun Carn, MD  albuterol (VENTOLIN HFA) 108 (90 Base)  MCG/ACT inhaler Inhale 1-2 puffs into the lungs every 6 (six) hours as needed for wheezing or shortness of breath. 03/21/20   Pisciotta, Joni Reining, PA-C  traZODone (DESYREL) 50 MG tablet Take 100 mg by mouth at bedtime.  03/25/18   [provider]    Allergies Iron, Iron dextran, Latex, Banana, and Penicillins   REVIEW OF SYSTEMS  Negative except as noted here or in the History of Present Illness.   PHYSICAL EXAMINATION  Initial Vital Signs Blood pressure 130/76, pulse 81, temperature 98.6 F (37 C), temperature source Oral, resp. rate 18, height 5\' 7"  (1.702 m), weight 108.9 kg, last menstrual period 10/19/2015, SpO2 98 %.  Examination General: Well-developed, well-nourished female in no acute distress; appearance consistent with age of record HENT: normocephalic; atraumatic Eyes: Normal appearance Neck: supple Heart: regular rate and rhythm Lungs: clear to auscultation bilaterally Abdomen: soft; nondistended; nontender; right lower quadrant tenderness; bowel sounds present Extremities: No deformity; full range of motion; pulses normal Neurologic: Awake, alert and oriented; motor function intact in all extremities and symmetric; no facial droop Skin: Warm and dry Psychiatric: Tearful   RESULTS  Summary of this visit's results, reviewed and interpreted by myself:   EKG Interpretation  Date/Time:    Ventricular Rate:    PR Interval:  QRS Duration:   QT Interval:    QTC Calculation:   R Axis:     Text Interpretation:         Laboratory Studies: Results for orders placed or performed during the hospital encounter of 01/30/21 (from the past 24 hour(s))  Lipase, blood     Status: None   Collection Time: 01/30/21  9:10 PM  Result Value Ref Range   Lipase 27 11 - 51 U/L  Comprehensive metabolic panel     Status: Abnormal   Collection Time: 01/30/21  9:10 PM  Result Value Ref Range   Sodium 134 (L) 135 - 145 mmol/L   Potassium 3.8 3.5 - 5.1 mmol/L   Chloride  101 98 - 111 mmol/L   CO2 24 22 - 32 mmol/L   Glucose, Bld 97 70 - 99 mg/dL   BUN 9 6 - 20 mg/dL   Creatinine, Ser 6.76 0.44 - 1.00 mg/dL   Calcium 9.1 8.9 - 19.5 mg/dL   Total Protein 7.8 6.5 - 8.1 g/dL   Albumin 4.1 3.5 - 5.0 g/dL   AST 27 15 - 41 U/L   ALT 23 0 - 44 U/L   Alkaline Phosphatase 83 38 - 126 U/L   Total Bilirubin 0.2 (L) 0.3 - 1.2 mg/dL   GFR, Estimated >09 >32 mL/min   Anion gap 9 5 - 15  CBC     Status: Abnormal   Collection Time: 01/30/21  9:10 PM  Result Value Ref Range   WBC 6.5 4.0 - 10.5 K/uL   RBC 4.88 3.87 - 5.11 MIL/uL   Hemoglobin 12.7 12.0 - 15.0 g/dL   HCT 67.1 24.5 - 80.9 %   MCV 79.3 (L) 80.0 - 100.0 fL   MCH 26.0 26.0 - 34.0 pg   MCHC 32.8 30.0 - 36.0 g/dL   RDW 98.3 (H) 38.2 - 50.5 %   Platelets 302 150 - 400 K/uL   nRBC 0.0 0.0 - 0.2 %  Urinalysis, Routine w reflex microscopic Urine, Clean Catch     Status: Abnormal   Collection Time: 01/30/21  9:45 PM  Result Value Ref Range   Color, Urine YELLOW YELLOW   APPearance CLEAR CLEAR   Specific Gravity, Urine 1.025 1.005 - 1.030   pH 6.5 5.0 - 8.0   Glucose, UA NEGATIVE NEGATIVE mg/dL   Hgb urine dipstick TRACE (A) NEGATIVE   Bilirubin Urine NEGATIVE NEGATIVE   Ketones, ur NEGATIVE NEGATIVE mg/dL   Protein, ur NEGATIVE NEGATIVE mg/dL   Nitrite NEGATIVE NEGATIVE   Leukocytes,Ua NEGATIVE NEGATIVE  Urinalysis, Microscopic (reflex)     Status: Abnormal   Collection Time: 01/30/21  9:45 PM  Result Value Ref Range   RBC / HPF 6-10 0 - 5 RBC/hpf   WBC, UA 0-5 0 - 5 WBC/hpf   Bacteria, UA FEW (A) NONE SEEN   Squamous Epithelial / LPF 0-5 0 - 5   Mucus PRESENT    Imaging Studies: US PELVIC COMPLETE W TRANSVAGINAL AND TORSION R/O  Result Date: 01/30/2021 CLINICAL DATA:  Pelvic pain EXAM: TRANSABDOMINAL AND TRANSVAGINAL ULTRASOUND OF PELVIS DOPPLER ULTRASOUND OF OVARIES TECHNIQUE: Both transabdominal and transvaginal ultrasound examinations of the pelvis were performed. Transabdominal technique  was performed for global imaging of the pelvis including uterus, ovaries, adnexal regions, and pelvic cul-de-sac. It was necessary to proceed with endovaginal exam following the transabdominal exam to visualize the left ovary. Color and duplex Doppler ultrasound was utilized to evaluate blood flow to the ovaries. COMPARISON:  CT abdomen/pelvis and ultrasound pelvis dated 12/23/2020 FINDINGS: Uterus Surgically absent. Right ovary Measurements: 5.7 x 3.4 x 3.8 cm = volume: 37 mL. Dominant 4.5 x 2.9 x 2.9 cm cyst with adjacent 1.2 cm daughter cyst. This appearance is new from the recent prior and therefore physiologic. Left ovary Measurements: 2.9 x 1.9 x 1.8 cm = volume: 5.0 mL. Normal appearance/no adnexal mass. Pulsed Doppler evaluation of both ovaries demonstrates normal low-resistance arterial and venous waveforms. Other findings No abnormal free fluid. IMPRESSION: Status post hysterectomy. 4.5 cm physiologic right ovarian cyst, new from recent prior, benign. Dedicated follow-up imaging not required. No evidence of ovarian torsion. Electronically Signed   By: Charline Bills M.D.   On: 01/30/2021 23:41    ED COURSE and MDM  Nursing notes, initial and subsequent vitals signs, including pulse oximetry, reviewed and interpreted by myself.  Vitals:   01/30/21 2230 01/30/21 2300 01/31/21 0000 01/31/21 0030  BP: 107/69 130/76 123/71 122/71  Pulse: 82 81 80 67  Resp: 18 18 18 18   Temp:      TempSrc:      SpO2: 98% 98% 96% 98%  Weight:      Height:       Medications  ondansetron (ZOFRAN) injection 4 mg (4 mg Intravenous Given 01/30/21 2352)  fentaNYL (SUBLIMAZE) injection 100 mcg (100 mcg Intravenous Given 01/30/21 2353)   1:02 AM Pain improved with IV fentanyl.  The cyst is about doubled in diameter since previous study.  Acute pain is likely due to this enlargement.  We will treat the patient's pain and refer back to her OB/GYN.   PROCEDURES  Procedures   ED DIAGNOSES     ICD-10-CM   1.  Cyst of right ovary  N83.201         Irianna Gilday, 13/9/22, MD 01/31/21 [phone]

## 2021-01-30 NOTE — ED Triage Notes (Signed)
Pt c/o pelvic pain, lower back pain, painful urination and BMs, n/v x "months"-states she has been dx with ovarian cyst-no relief with pain meds-states she has been GYN x 3 for same c/o-NAD-steady gait

## 2021-01-31 MED ORDER — OXYCODONE-ACETAMINOPHEN 5-325 MG PO TABS
1.0000 | ORAL_TABLET | Freq: Four times a day (QID) | ORAL | 0 refills | Status: AC | PRN
Start: 2021-01-31 — End: ?

## 2021-04-24 ENCOUNTER — Emergency Department (HOSPITAL_BASED_OUTPATIENT_CLINIC_OR_DEPARTMENT_OTHER): Payer: BC Managed Care – PPO

## 2021-04-24 ENCOUNTER — Emergency Department (HOSPITAL_BASED_OUTPATIENT_CLINIC_OR_DEPARTMENT_OTHER)
Admission: EM | Admit: 2021-04-24 | Discharge: 2021-04-24 | Disposition: A | Discharge status: Home / Self Care | Payer: BC Managed Care – PPO | Attending: Emergency Medicine | Admitting: Emergency Medicine

## 2021-04-24 ENCOUNTER — Encounter (HOSPITAL_BASED_OUTPATIENT_CLINIC_OR_DEPARTMENT_OTHER): Payer: Self-pay | Admitting: Emergency Medicine

## 2021-04-24 ENCOUNTER — Other Ambulatory Visit: Payer: Self-pay

## 2021-04-24 DIAGNOSIS — Z79899 Other long term (current) drug therapy: Secondary | ICD-10-CM | POA: Diagnosis not present

## 2021-04-24 DIAGNOSIS — G43111 Migraine with aura, intractable, with status migrainosus: Secondary | ICD-10-CM | POA: Insufficient documentation

## 2021-04-24 DIAGNOSIS — Z91040 Latex allergy status: Secondary | ICD-10-CM | POA: Insufficient documentation

## 2021-04-24 DIAGNOSIS — R519 Headache, unspecified: Secondary | ICD-10-CM | POA: Diagnosis present

## 2021-04-24 DIAGNOSIS — I10 Essential (primary) hypertension: Secondary | ICD-10-CM | POA: Insufficient documentation

## 2021-04-24 MED ORDER — PROCHLORPERAZINE EDISYLATE 10 MG/2ML IJ SOLN
10.0000 mg | Freq: Once | INTRAMUSCULAR | Status: AC
  Administered 2021-04-24: 10 mg via INTRAVENOUS
  Filled 2021-04-24: qty 2

## 2021-04-24 MED ORDER — LACTATED RINGERS IV BOLUS
1000.0000 mL | Freq: Once | INTRAVENOUS | Status: AC
  Administered 2021-04-24: 1000 mL via INTRAVENOUS

## 2021-04-24 NOTE — Discharge Instructions (Signed)
Your CAT scan today was normal.  Your blood pressure is improving and now 143/84.  It will be important to continue following that with your regular doctor.  There are long-term headache medications people can be on to help prevent migraines and that is something you can talk with your neurologist about.

## 2021-04-24 NOTE — ED Notes (Signed)
Patient transported to CT 

## 2021-04-24 NOTE — ED Provider Notes (Addendum)
[facility] EMERGENCY DEPARTMENT Provider Note   CSN: [id number] Arrival date & time: 04/24/21  1911     History  Chief Complaint  Patient presents with   Headache    Tanya Herrera is a 36 y.o. female.  Patient is a 36 year old female with a history of hypertension, hemophilia, recurrent headaches who is presenting today with headache, blurry vision and high blood pressure.  Patient reports for some time now she has been having regular headaches which can linger sometimes for days.  She has seen her neurologist about this and she reports they say she has migraines but cannot seem to find any other cause for her symptoms and have not put her on any preventative treatments.  She reports the headache today started like a normal headache this morning and she took some over-the-counter medication but did not think too much about it but it became more severe in the afternoon.  She was sitting in the car waiting for her husband and her head was pounding and she had seen some black dots in her visual fields earlier today but it became more prominent almost like a flash going off and her vision was intermittently blurry.  She reports at that time she checked her blood pressure and it was elevated.  She used to be on blood pressure medication but had been taken off approximately a year ago and every time she sees her doctor she reports her blood pressure has looked good.  She has not had any cough congestion or fever.  She denies any neck pain.  She has no nausea or vomiting.  Light does seem to make the headache worse.  However she has no difficulty walking and denies unilateral numbness or weakness.  The history is provided by the patient.  Headache Pain location:  Frontal     Home Medications Prior to Admission medications   Medication Sig Start Date End Date Taking? Authorizing Provider  albuterol (VENTOLIN HFA) 108 (90 Base) MCG/ACT inhaler Inhale 1-2 puffs into the lungs every 6  (six) hours as needed for wheezing or shortness of breath. 03/21/20   Pisciotta, Joni Reining, PA-C  oxyCODONE-acetaminophen (PERCOCET) 5-325 MG tablet Take 1 tablet by mouth every 6 (six) hours as needed for severe pain. 01/31/21   Molpus, John, MD  traZODone (DESYREL) 50 MG tablet Take 100 mg by mouth at bedtime.  03/25/18   [provider]      Allergies    Iron, Iron dextran, Latex, Banana, and Penicillins    Review of Systems   Review of Systems  Neurological:  Positive for headaches.   Physical Exam Updated Vital Signs BP (!) 154/99    Pulse 80    Temp 98.5 F (36.9 C) (Oral)    Resp 16    Ht 5\' 6"  (1.676 m)    Wt 99.3 kg    LMP 10/19/2015    SpO2 97%    BMI 35.35 kg/m  Physical Exam Vitals and nursing note reviewed.  Constitutional:      General: She is not in acute distress.    Appearance: She is well-developed.  HENT:     Head: Normocephalic and atraumatic.     Nose: Nose normal. No congestion.     Mouth/Throat:     Mouth: Mucous membranes are moist.  Eyes:     Extraocular Movements: Extraocular movements intact.     Conjunctiva/sclera: Conjunctivae normal.     Pupils: Pupils are equal, round, and reactive to light.  Funduscopic exam:    Right eye: No papilledema.        Left eye: No papilledema.  Cardiovascular:     Rate and Rhythm: Normal rate and regular rhythm.     Heart sounds: Normal heart sounds. No murmur heard.   No friction rub.  Pulmonary:     Effort: Pulmonary effort is normal.     Breath sounds: Normal breath sounds. No wheezing or rales.  Abdominal:     General: Bowel sounds are normal. There is no distension.     Palpations: Abdomen is soft.     Tenderness: There is no abdominal tenderness. There is no guarding or rebound.  Musculoskeletal:        General: No tenderness. Normal range of motion.     Cervical back: Normal range of motion and neck supple.     Right lower leg: No edema.     Left lower leg: No edema.     Comments: No edema   Lymphadenopathy:     Cervical: No cervical adenopathy.  Skin:    General: Skin is warm and dry.     Findings: No rash.  Neurological:     Mental Status: She is alert and oriented to person, place, and time. Mental status is at baseline.     Cranial Nerves: No cranial nerve deficit.     Sensory: No sensory deficit.     Motor: No weakness.     Coordination: Coordination normal.     Gait: Gait normal.     Comments: photophobia  Psychiatric:        Mood and Affect: Mood normal.        Behavior: Behavior normal.    ED Results / Procedures / Treatments   Labs (all labs ordered are listed, but only abnormal results are displayed) Labs Reviewed - No data to display  EKG None  Radiology CT Head Wo Contrast  Result Date: 04/24/2021 CLINICAL DATA:  Sudden onset severe headache EXAM: CT HEAD WITHOUT CONTRAST TECHNIQUE: Contiguous axial images were obtained from the base of the skull through the vertex without intravenous contrast. RADIATION DOSE REDUCTION: This exam was performed according to the departmental dose-optimization program which includes automated exposure control, adjustment of the mA and/or kV according to patient size and/or use of iterative reconstruction technique. COMPARISON:  06/18/2020 FINDINGS: Brain: No acute infarct or hemorrhage. Lateral ventricles and midline structures are unremarkable. No acute extra-axial fluid collections. No mass effect. Vascular: No hyperdense vessel or unexpected calcification. Skull: Normal. Negative for fracture or focal lesion. Sinuses/Orbits: No acute finding. Other: None. IMPRESSION: 1. No acute intracranial process.  Stable exam. Electronically Signed   By: Sharlet Salina M.D.   On: 04/24/2021 19:53    Procedures Procedures    Medications Ordered in ED Medications  prochlorperazine (COMPAZINE) injection 10 mg (10 mg Intravenous Given 04/24/21 2005)  lactated ringers bolus 1,000 mL ( Intravenous Stopped 04/24/21 2104)    ED Course/  Medical Decision Making/ A&P                           Medical Decision Making Amount and/or Complexity of Data Reviewed External Data Reviewed: notes. Radiology: ordered and independent interpretation performed. Decision-making details documented in ED Course.  Risk Prescription drug management.   Pt with typical migraine HA sx with aura but no prior hx of aura with her headaches.  Low suspicion for  infection, or cavernous vein thrombosis.  However patient has  a history of hemophilia and given the headaches characteristics are a bit different today we will do a scan to ensure no intracranial bleeding.  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  Normal neuro exam and vital signs. Will give HA cocktail and on re-eval.  9:33 PM I independently interpreted patient's head CT and is negative for bleed.  On repeat evaluation patient's headache is gone and she is feeling better.  There is no indication for admission today.  Findings discussed with the patient and her questions were answered.  She is stable for discharge.  She will follow-up with her neurologist and discuss possible maintenance therapy to prevent her recurrent headaches         Final Clinical Impression(s) / ED Diagnoses Final diagnoses:  Intractable migraine with aura with status migrainosus    Rx / DC Orders ED Discharge Orders     None         Gwyneth SproutPlunkett, Javyn Havlin, MD 04/24/21 2133    Gwyneth SproutPlunkett, Avonelle Viveros, MD 04/24/21 2133

## 2021-04-24 NOTE — ED Triage Notes (Addendum)
Pt c/o headache since 9am and blurred vision and seeing black dots x 1 hour. Pt also concerned with high blood pressure.

## 2021-09-28 ENCOUNTER — Emergency Department (HOSPITAL_BASED_OUTPATIENT_CLINIC_OR_DEPARTMENT_OTHER)
Admission: EM | Admit: 2021-09-28 | Discharge: 2021-09-28 | Disposition: A | Discharge status: Home / Self Care | Payer: Medicaid Other | Attending: Emergency Medicine | Admitting: Emergency Medicine

## 2021-09-28 ENCOUNTER — Other Ambulatory Visit: Payer: Self-pay

## 2021-09-28 ENCOUNTER — Encounter (HOSPITAL_BASED_OUTPATIENT_CLINIC_OR_DEPARTMENT_OTHER): Payer: Self-pay | Admitting: Emergency Medicine

## 2021-09-28 DIAGNOSIS — G43909 Migraine, unspecified, not intractable, without status migrainosus: Secondary | ICD-10-CM | POA: Diagnosis not present

## 2021-09-28 DIAGNOSIS — Z91040 Latex allergy status: Secondary | ICD-10-CM | POA: Insufficient documentation

## 2021-09-28 DIAGNOSIS — R519 Headache, unspecified: Secondary | ICD-10-CM | POA: Diagnosis present

## 2021-09-28 DIAGNOSIS — I10 Essential (primary) hypertension: Secondary | ICD-10-CM | POA: Diagnosis not present

## 2021-09-28 LAB — CBC
HCT: 43.2 % (ref 36.0–46.0)
Hemoglobin: 14.3 g/dL (ref 12.0–15.0)
MCH: 25.1 pg — ABNORMAL LOW (ref 26.0–34.0)
MCHC: 33.1 g/dL (ref 30.0–36.0)
MCV: 75.8 fL — ABNORMAL LOW (ref 80.0–100.0)
Platelets: 290 10*3/uL (ref 150–400)
RBC: 5.7 MIL/uL — ABNORMAL HIGH (ref 3.87–5.11)
RDW: 16.9 % — ABNORMAL HIGH (ref 11.5–15.5)
WBC: 5.6 10*3/uL (ref 4.0–10.5)
nRBC: 0 % (ref 0.0–0.2)

## 2021-09-28 LAB — COMPREHENSIVE METABOLIC PANEL
ALT: 39 U/L (ref 0–44)
AST: 44 U/L — ABNORMAL HIGH (ref 15–41)
Albumin: 4.3 g/dL (ref 3.5–5.0)
Alkaline Phosphatase: 102 U/L (ref 38–126)
Anion gap: 8 (ref 5–15)
BUN: 8 mg/dL (ref 6–20)
CO2: 25 mmol/L (ref 22–32)
Calcium: 9.4 mg/dL (ref 8.9–10.3)
Chloride: 104 mmol/L (ref 98–111)
Creatinine, Ser: 0.81 mg/dL (ref 0.44–1.00)
GFR, Estimated: >60 mL/min (ref >60)
Glucose, Bld: 96 mg/dL (ref 70–99)
Potassium: 3.7 mmol/L (ref 3.5–5.1)
Sodium: 137 mmol/L (ref 135–145)
Total Bilirubin: 0.5 mg/dL (ref 0.3–1.2)
Total Protein: 8.7 g/dL — ABNORMAL HIGH (ref 6.5–8.1)

## 2021-09-28 LAB — LIPASE, BLOOD: Lipase: 29 U/L (ref 11–51)

## 2021-09-28 MED ORDER — METOCLOPRAMIDE HCL 5 MG/ML IJ SOLN
10.0000 mg | Freq: Once | INTRAMUSCULAR | Status: AC
Start: 2021-09-28 — End: 2021-09-28
  Administered 2021-09-28: 10 mg via INTRAVENOUS
  Filled 2021-09-28: qty 2

## 2021-09-28 MED ORDER — DEXAMETHASONE SODIUM PHOSPHATE 10 MG/ML IJ SOLN
10.0000 mg | Freq: Once | INTRAMUSCULAR | Status: AC
Start: 2021-09-28 — End: 2021-09-28
  Administered 2021-09-28: 10 mg via INTRAVENOUS
  Filled 2021-09-28: qty 1

## 2021-09-28 MED ORDER — KETOROLAC TROMETHAMINE 15 MG/ML IJ SOLN
15.0000 mg | Freq: Once | INTRAMUSCULAR | Status: AC
Start: 2021-09-28 — End: 2021-09-28
  Administered 2021-09-28: 15 mg via INTRAVENOUS
  Filled 2021-09-28: qty 1

## 2021-09-28 MED ORDER — DIPHENHYDRAMINE HCL 50 MG/ML IJ SOLN
50.0000 mg | Freq: Once | INTRAMUSCULAR | Status: AC
Start: 2021-09-28 — End: 2021-09-28
  Administered 2021-09-28: 50 mg via INTRAVENOUS
  Filled 2021-09-28: qty 1

## 2021-09-28 NOTE — ED Provider Notes (Signed)
[facility] EMERGENCY DEPARTMENT Provider Note   CSN: [id number] Arrival date & time: 09/28/21  1459     History  Chief Complaint  Patient presents with   Headache    Tanya Herrera is a 36 y.o. female.  The history is provided by the patient and medical records. No language interpreter was used.  Headache   36 year old female significant history of anemia, anxiety, hypertension, depression, recurrent migraine, obesity, presenting complaint of headache.  Patient reports she has been having a persistent migraine headache ongoing for the past 2 weeks.  She reported headache as a sharp throbbing sensation to the right side of her head with associated light and sound sensitivity and associate nausea.  Headache is similar to prior headaches that she has had in the past but not relieved despite using over-the-counter medication including ibuprofen, Tylenol, Advil, as well as headache medication prescribed to her by her PCP.  She does have an appointment to be seen by her neurologist in August.  She is here due to her frustration of her persistent headache.  She mention she has had numerous testing in the past including head CT scan without any obvious finding.  She denies fever, neck stiffness, rash, recent tick bite, focal numbness or focal weakness loss of vision.  Home Medications Prior to Admission medications   Medication Sig Start Date End Date Taking? Authorizing Provider  albuterol (VENTOLIN HFA) 108 (90 Base) MCG/ACT inhaler Inhale 1-2 puffs into the lungs every 6 (six) hours as needed for wheezing or shortness of breath. 03/21/20   Pisciotta, Joni Reining, PA-C  oxyCODONE-acetaminophen (PERCOCET) 5-325 MG tablet Take 1 tablet by mouth every 6 (six) hours as needed for severe pain. 01/31/21   Molpus, John, MD  traZODone (DESYREL) 50 MG tablet Take 100 mg by mouth at bedtime.  03/25/18   [provider]      Allergies    Iron, Iron dextran, Latex, Banana, and Penicillins     Review of Systems   Review of Systems  Neurological:  Positive for headaches.  All other systems reviewed and are negative.   Physical Exam Updated Vital Signs BP (!) 147/103 (BP Location: Right Arm)   Pulse 70   Temp 98.8 F (37.1 C) (Oral)   Resp 18   Ht 5\' 6"  (1.676 m)   Wt 99.8 kg   LMP 10/19/2015   SpO2 100%   BMI 35.51 kg/m  Physical Exam Vitals and nursing note reviewed.  Constitutional:      General: She is not in acute distress.    Appearance: She is well-developed. She is obese.  HENT:     Head: Atraumatic.  Eyes:     General: No visual field deficit.    Extraocular Movements: Extraocular movements intact.     Conjunctiva/sclera: Conjunctivae normal.  Cardiovascular:     Rate and Rhythm: Normal rate and regular rhythm.  Pulmonary:     Effort: Pulmonary effort is normal.  Musculoskeletal:     Cervical back: Normal range of motion and neck supple.     Comments: 5 out of 5 strength all 4 extremities  Skin:    Findings: No rash.  Neurological:     Mental Status: She is alert and oriented to person, place, and time.     GCS: GCS eye subscore is 4. GCS verbal subscore is 5. GCS motor subscore is 6.     Cranial Nerves: No cranial nerve deficit, dysarthria or facial asymmetry.     Sensory: No sensory  deficit.  Psychiatric:        Mood and Affect: Mood normal.     ED Results / Procedures / Treatments   Labs (all labs ordered are listed, but only abnormal results are displayed) Labs Reviewed  COMPREHENSIVE METABOLIC PANEL - Abnormal; Notable for the following components:      Result Value   Total Protein 8.7 (*)    AST 44 (*)    All other components within normal limits  CBC - Abnormal; Notable for the following components:   RBC 5.70 (*)    MCV 75.8 (*)    MCH 25.1 (*)    RDW 16.9 (*)    All other components within normal limits  LIPASE, BLOOD  URINALYSIS, ROUTINE W REFLEX MICROSCOPIC    EKG None  Radiology No results  found.  Procedures Procedures    Medications Ordered in ED Medications  dexamethasone (DECADRON) injection 10 mg (10 mg Intravenous Given 09/28/21 1634)  diphenhydrAMINE (BENADRYL) injection 50 mg (50 mg Intravenous Given 09/28/21 1630)  metoCLOPramide (REGLAN) injection 10 mg (10 mg Intravenous Given 09/28/21 1637)  ketorolac (TORADOL) 15 MG/ML injection 15 mg (15 mg Intravenous Given 09/28/21 1632)    ED Course/ Medical Decision Making/ A&P                           Medical Decision Making Amount and/or Complexity of Data Reviewed Labs: ordered.  Risk Prescription drug management.   BP (!) 147/103 (BP Location: Right Arm)   Pulse 70   Temp 98.8 F (37.1 C) (Oral)   Resp 18   Ht 5\' 6"  (1.676 m)   Wt 99.8 kg   LMP 10/19/2015   SpO2 100%   BMI 35.51 kg/m   4:21 PM This is a 36 year old female with known history of migraine headache presenting complaining of progressive worsening headache.  Headache is ongoing for the past 2 weeks, it is unilateral with associated photophobia and phonophobia as well as nausea.  No red flags.  Pain not adequately controlled with over-the-counter medication and headache medication prescribed to her by her PCP.  She reports she is scheduled to be seen by her neurologist in August.  On exam, patient is well-appearing appears to be in no acute discomfort.  She does not have any nuchal rigidity to suggest meningitis.  She is afebrile.  This is not a thunderclap sudden onset headache concerning for subarachnoid hemorrhage.  She is without any focal neurodeficits to suggest stroke or space-occupying lesion.  Vital sign review blood pressure is 147/103.  Given her symptoms, will provide migraine cocktail which include Toradol, Benadryl, Reglan, and Decadron with expectation to help decrease her headache intensity.  Patient agrees with plan.  6:25 PM Labs obtained independently viewed interpreted by me.  Labs are reassuring, normal electrolyte panels, normal  lipase, normal WBC and normal H&H.  Blood pressure did improve. On reassessment after receiving medication patient reported feeling much better and felt comfortable going home.  I encouraged patient to keep a migraine diary and to follow-up closely with her neurologist as scheduled for outpatient management of her headache.  Return precaution given.  I have reviewed patient's EMR and considered in my plan of care.  I have also considered patient's social determinant of health and did not see any constraints to her care.        Final Clinical Impression(s) / ED Diagnoses Final diagnoses:  Migraine without status migrainosus, not intractable, unspecified migraine type  Rx / DC Orders ED Discharge Orders     None         Fayrene Helper, PA-C 09/28/21 1829    Charlynne Pander, MD 09/28/21 2245
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
## 2021-09-28 NOTE — Discharge Instructions (Addendum)
Please follow-up closely with your neurologist for further managements of your recurrent headache.  Please keep a migraine diary to help identify any triggers that may aggravate your headache.

## 2022-01-16 ENCOUNTER — Emergency Department (HOSPITAL_BASED_OUTPATIENT_CLINIC_OR_DEPARTMENT_OTHER)
Admission: EM | Admit: 2022-01-16 | Discharge: 2022-01-16 | Discharge status: Against Medical Advice | Payer: Medicaid Other | Attending: Emergency Medicine | Admitting: Emergency Medicine

## 2022-01-16 ENCOUNTER — Other Ambulatory Visit: Payer: Self-pay

## 2022-01-16 ENCOUNTER — Encounter (HOSPITAL_BASED_OUTPATIENT_CLINIC_OR_DEPARTMENT_OTHER): Payer: Self-pay

## 2022-01-16 DIAGNOSIS — Z5321 Procedure and treatment not carried out due to patient leaving prior to being seen by health care provider: Secondary | ICD-10-CM | POA: Diagnosis not present

## 2022-01-16 DIAGNOSIS — D7389 Other diseases of spleen: Secondary | ICD-10-CM | POA: Diagnosis present

## 2022-01-16 NOTE — ED Notes (Signed)
Patient notified registration she was leaving.  

## 2022-01-16 NOTE — ED Triage Notes (Signed)
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 Patient reports pain is worse and MD gave her ativan for pain.  +nausea.

## 2022-07-07 ENCOUNTER — Encounter: Payer: Self-pay | Admitting: *Deleted

## 2022-08-27 ENCOUNTER — Other Ambulatory Visit: Payer: Self-pay

## 2022-08-27 ENCOUNTER — Encounter (HOSPITAL_BASED_OUTPATIENT_CLINIC_OR_DEPARTMENT_OTHER): Payer: Self-pay | Admitting: Emergency Medicine

## 2022-08-27 ENCOUNTER — Other Ambulatory Visit (HOSPITAL_BASED_OUTPATIENT_CLINIC_OR_DEPARTMENT_OTHER): Payer: Self-pay

## 2022-08-27 ENCOUNTER — Emergency Department (HOSPITAL_BASED_OUTPATIENT_CLINIC_OR_DEPARTMENT_OTHER)
Admission: EM | Admit: 2022-08-27 | Discharge: 2022-08-27 | Disposition: A | Discharge status: Home / Self Care | Payer: Medicaid Other | Attending: Emergency Medicine | Admitting: Emergency Medicine

## 2022-08-27 DIAGNOSIS — K068 Other specified disorders of gingiva and edentulous alveolar ridge: Secondary | ICD-10-CM

## 2022-08-27 DIAGNOSIS — Z91040 Latex allergy status: Secondary | ICD-10-CM | POA: Diagnosis not present

## 2022-08-27 DIAGNOSIS — K0889 Other specified disorders of teeth and supporting structures: Secondary | ICD-10-CM | POA: Diagnosis present

## 2022-08-27 MED ORDER — CLINDAMYCIN HCL 300 MG PO CAPS
300.0000 mg | ORAL_CAPSULE | Freq: Four times a day (QID) | ORAL | 0 refills | Status: AC
  Filled 2022-08-27: qty 28, 7d supply, fill #0

## 2022-08-27 MED ORDER — LIDOCAINE VISCOUS HCL 2 % MT SOLN
15.0000 mL | Freq: Once | OROMUCOSAL | Status: AC
  Administered 2022-08-27: 15 mL via OROMUCOSAL
  Filled 2022-08-27: qty 15

## 2022-08-27 MED ORDER — MELOXICAM 7.5 MG PO TABS
7.5000 mg | ORAL_TABLET | Freq: Every day | ORAL | 0 refills | Status: AC
  Filled 2022-08-27: qty 5, 5d supply, fill #0

## 2022-08-27 MED ORDER — NAPROXEN 500 MG PO TABS
500.0000 mg | ORAL_TABLET | Freq: Two times a day (BID) | ORAL | 0 refills | Status: DC
Start: 2022-08-27 — End: 2022-08-27
  Filled 2022-08-27: qty 10, 5d supply, fill #0

## 2022-08-27 MED ORDER — CLINDAMYCIN HCL 150 MG PO CAPS
300.0000 mg | ORAL_CAPSULE | Freq: Once | ORAL | Status: AC
  Administered 2022-08-27: 300 mg via ORAL
  Filled 2022-08-27: qty 2

## 2022-08-27 NOTE — ED Triage Notes (Signed)
Reports right sided mouth pain that started yesterday, took Anbesol and ibuprofen for pain, reports seeing her dentist 2 months ago

## 2022-08-27 NOTE — ED Provider Notes (Signed)
[facility] AT [facility] Provider Note   CSN: [id number] Arrival date & time: 08/27/22  4098     History  Chief Complaint  Patient presents with   Dental Pain    Tanya Herrera is a 37 y.o. female.  The history is provided by the patient.  Dental Pain Location:  Upper and lower Upper teeth location:  14/LU 1st molar Lower teeth location:  19/LL 1st molar Quality:  Aching Severity:  Moderate Onset quality:  Sudden Timing:  Constant Progression:  Unchanged Chronicity:  New Context: not enamel fracture, filling intact and not recent dental surgery   Previous work-up:  Dental exam Relieved by:  Nothing Worsened by:  Nothing Ineffective treatments:  NSAIDs Associated symptoms: facial pain   Associated symptoms: no facial swelling and no fever   Risk factors: no alcohol problem        Home Medications Prior to Admission medications   Medication Sig Start Date End Date Taking? Authorizing Provider  clindamycin (CLEOCIN) 300 MG capsule Take 1 capsule (300 mg total) by mouth 4 (four) times daily. X 7 days 08/27/22  Yes Doria Fern, MD  meloxicam (MOBIC) 7.5 MG tablet Take 1 tablet (7.5 mg total) by mouth daily. 08/27/22  Yes Levert Heslop, MD  albuterol (VENTOLIN HFA) 108 (90 Base) MCG/ACT inhaler Inhale 1-2 puffs into the lungs every 6 (six) hours as needed for wheezing or shortness of breath. 03/21/20   Pisciotta, Joni Reining, PA-C  oxyCODONE-acetaminophen (PERCOCET) 5-325 MG tablet Take 1 tablet by mouth every 6 (six) hours as needed for severe pain. 01/31/21   Molpus, John, MD  traZODone (DESYREL) 50 MG tablet Take 100 mg by mouth at bedtime.  03/25/18   [provider]      Allergies    Iron, Iron dextran, Latex, Banana, Hydrocodone-acetaminophen, and Penicillins    Review of Systems   Review of Systems  Constitutional:  Negative for fever.  HENT:  Positive for dental problem. Negative for facial swelling.   Eyes:  Negative for  redness.  Respiratory:  Negative for wheezing and stridor.   Gastrointestinal:  Negative for abdominal pain.  All other systems reviewed and are negative.   Physical Exam Updated Vital Signs BP (!) 129/92 (BP Location: Right Arm)   Pulse 85   Temp 98.4 F (36.9 C)   Resp 20   Ht 5\' 6"  (1.676 m)   Wt 102.1 kg   LMP 10/19/2015   SpO2 100%   BMI 36.32 kg/m  Physical Exam Vitals and nursing note reviewed.  Constitutional:      General: She is not in acute distress.    Appearance: Normal appearance. She is well-developed.  HENT:     Head: Normocephalic and atraumatic.     Nose: Nose normal.     Mouth/Throat:     Mouth: Mucous membranes are moist.     Pharynx: Oropharynx is clear. No oropharyngeal exudate or posterior oropharyngeal erythema.  Eyes:     Pupils: Pupils are equal, round, and reactive to light.  Cardiovascular:     Rate and Rhythm: Normal rate and regular rhythm.     Pulses: Normal pulses.     Heart sounds: Normal heart sounds.  Pulmonary:     Effort: Pulmonary effort is normal. No respiratory distress.     Breath sounds: Normal breath sounds.  Abdominal:     General: Bowel sounds are normal. There is no distension.     Palpations: Abdomen is soft.  Genitourinary:  Vagina: No vaginal discharge.  Musculoskeletal:        General: Normal range of motion.     Cervical back: Normal range of motion and neck supple.  Skin:    General: Skin is warm and dry.     Capillary Refill: Capillary refill takes less than 2 seconds.     Findings: No erythema or rash.  Neurological:     General: No focal deficit present.     Mental Status: She is alert and oriented to person, place, and time.  Psychiatric:        Mood and Affect: Mood normal.     ED Results / Procedures / Treatments   Labs (all labs ordered are listed, but only abnormal results are displayed) Labs Reviewed - No data to display  EKG None  Radiology No results found.  Procedures Procedures     Medications Ordered in ED Medications  clindamycin (CLEOCIN) capsule 300 mg (has no administration in time range)  lidocaine (XYLOCAINE) 2 % viscous mouth solution 15 mL (has no administration in time range)    ED Course/ Medical Decision Making/ A&P                             Medical Decision Making Patient with upper and lower gum and tooth pain on the left, seen 2 months ago  Amount and/or Complexity of Data Reviewed External Data Reviewed: notes.    Details: Previous notes reviewed   Risk Prescription drug management. Risk Details: Patient with upper and lower gum pain in the area of the LU and LL 1st molars.  Denied ingestion of seeds.  Will start antibiotics and have patient follow up with dentistry for ongoing care.      Final Clinical Impression(s) / ED Diagnoses Final diagnoses:  Pain, dental   Return for intractable cough, coughing up blood, fevers > 100.4 unrelieved by medication, shortness of breath, intractable vomiting, chest pain, shortness of breath, weakness, numbness, changes in speech, facial asymmetry, abdominal pain, passing out, Inability to tolerate liquids or food, cough, altered mental status or any concerns. No signs of systemic illness or infection. The patient is nontoxic-appearing on exam and vital signs are within normal limits.  I have reviewed the triage vital signs and the nursing notes. Pertinent labs & imaging results that were available during my care of the patient were reviewed by me and considered in my medical decision making (see chart for details). After history, exam, and medical workup I feel the patient has been appropriately medically screened and is safe for discharge home. Pertinent diagnoses were discussed with the patient. Patient was given return precautions Rx / DC Orders      Jaena Brocato, MD 08/27/22 0981

## 2022-12-05 ENCOUNTER — Emergency Department (HOSPITAL_BASED_OUTPATIENT_CLINIC_OR_DEPARTMENT_OTHER): Payer: Medicaid Other

## 2022-12-05 ENCOUNTER — Encounter (HOSPITAL_BASED_OUTPATIENT_CLINIC_OR_DEPARTMENT_OTHER): Payer: Self-pay

## 2022-12-05 ENCOUNTER — Emergency Department (HOSPITAL_BASED_OUTPATIENT_CLINIC_OR_DEPARTMENT_OTHER)
Admission: EM | Admit: 2022-12-05 | Discharge: 2022-12-05 | Disposition: A | Discharge status: Home / Self Care | Payer: Medicaid Other | Attending: Emergency Medicine | Admitting: Emergency Medicine

## 2022-12-05 DIAGNOSIS — Z91040 Latex allergy status: Secondary | ICD-10-CM | POA: Diagnosis not present

## 2022-12-05 DIAGNOSIS — M79671 Pain in right foot: Secondary | ICD-10-CM | POA: Diagnosis present

## 2022-12-05 MED ORDER — IBUPROFEN 400 MG PO TABS
600.0000 mg | ORAL_TABLET | Freq: Once | ORAL | Status: AC
  Administered 2022-12-05: 600 mg via ORAL
  Filled 2022-12-05: qty 1
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
## 2022-12-05 NOTE — ED Notes (Signed)
Pt talking on phone at this time. Unable to discharge at this time

## 2022-12-05 NOTE — ED Provider Notes (Signed)
[place] [facility] AT [facility] Provider Note   CSN: [id number] Arrival date & time: 12/05/22  0102     History  Chief Complaint  Patient presents with   Foot Pain   Shoulder Injury    Tanya Herrera is a 37 y.o. female.   Foot Pain  Shoulder Injury  37 year old female presenting for right foot and shoulder pain.  Patient states for about a week she has had pain to the bottom of her right foot.  Is worse with ambulation.  She went to urgent care was diagnosed with plantar fasciitis.  She has been using a postop shoe, as well as Tylenol and ibuprofen without significant relief.  She wants reevaluation of this.  She has not had any specific injury.  No numbness or tingling.  She also notes this morning while her son was getting on the bus the bus driver close the door which hit her right shoulder.  She has pain there.  No numbness or tingling.  No chest pain or shortness of breath.     Home Medications Prior to Admission medications   Medication Sig Start Date End Date Taking? Authorizing Provider  albuterol (VENTOLIN HFA) 108 (90 Base) MCG/ACT inhaler Inhale 1-2 puffs into the lungs every 6 (six) hours as needed for wheezing or shortness of breath. 03/21/20   Pisciotta, Joni Reining, PA-C  clindamycin (CLEOCIN) 300 MG capsule Take 1 capsule (300 mg total) by mouth 4 (four) times daily. X 7 days 08/27/22   Palumbo, April, MD  meloxicam (MOBIC) 7.5 MG tablet Take 1 tablet (7.5 mg total) by mouth daily. 08/27/22   Palumbo, April, MD  oxyCODONE-acetaminophen (PERCOCET) 5-325 MG tablet Take 1 tablet by mouth every 6 (six) hours as needed for severe pain. 01/31/21   Molpus, John, MD  traZODone (DESYREL) 50 MG tablet Take 100 mg by mouth at bedtime.  03/25/18   [provider]      Allergies    Iron, Iron dextran, Latex, Banana, Hydrocodone-acetaminophen, and Penicillins    Review of Systems   Review of Systems Review of systems completed and notable as per  HPI.  ROS otherwise negative.   Physical Exam Updated Vital Signs BP (!) 135/94   Pulse 96   Temp 97.6 F (36.4 C)   Resp 18   Ht 5\' 6"  (1.676 m)   Wt 99.3 kg   LMP 10/19/2015   SpO2 100%   BMI 35.35 kg/m  Physical Exam Vitals and nursing note reviewed.  Constitutional:      General: She is not in acute distress.    Appearance: She is well-developed.  HENT:     Head: Normocephalic and atraumatic.  Eyes:     Conjunctiva/sclera: Conjunctivae normal.  Cardiovascular:     Rate and Rhythm: Normal rate and regular rhythm.     Heart sounds: No murmur heard. Pulmonary:     Effort: Pulmonary effort is normal. No respiratory distress.     Breath sounds: Normal breath sounds.  Abdominal:     Palpations: Abdomen is soft.     Tenderness: There is no abdominal tenderness.  Musculoskeletal:        General: No swelling.     Cervical back: Neck supple.     Comments: Mild tenderness over the right anterior deltoid.  She has good strength and range of motion of the shoulder, elbow, wrist and hand.  2+ radial pulse per normal sensation.  Mild tenderness over calcaneus and mild pain with dorsi and  plantarflexion but has good strength.  2+ DP and PT pulse.  Normal sensation.  Skin:    General: Skin is warm and dry.     Capillary Refill: Capillary refill takes less than 2 seconds.  Neurological:     Mental Status: She is alert.  Psychiatric:        Mood and Affect: Mood normal.     ED Results / Procedures / Treatments   Labs (all labs ordered are listed, but only abnormal results are displayed) Labs Reviewed - No data to display  EKG None  Radiology DG Shoulder Right  Result Date: 12/05/2022 CLINICAL DATA:  Right shoulder pain after bus door closing on the shoulder EXAM: RIGHT SHOULDER - 3 VIEW COMPARISON:  None Available. FINDINGS: There is no evidence of fracture or dislocation. There is no evidence of arthropathy or other focal bone abnormality. Soft tissues are unremarkable.  IMPRESSION: No acute fracture or dislocation. Electronically Signed   By: Agustin Cree M.D.   On: 12/05/2022 09:25   DG Foot Complete Right  Result Date: 12/05/2022 CLINICAL DATA:  Right foot pain. Recent diagnosis of plantar fasciitis EXAM: RIGHT FOOT COMPLETE - 3 VIEW COMPARISON:  None Available. FINDINGS: There is no evidence of fracture or dislocation. Dorsal calcaneal spur. Soft tissues are unremarkable. IMPRESSION: Dorsal calcaneal spur.  Otherwise no focal radiographic abnormality. Electronically Signed   By: Agustin Cree M.D.   On: 12/05/2022 09:24    Procedures Procedures    Medications Ordered in ED Medications  ibuprofen (ADVIL) tablet 600 mg (600 mg Oral Given 12/05/22 0844)    ED Course/ Medical Decision Making/ A&P                                 Medical Decision Making Amount and/or Complexity of Data Reviewed Radiology: ordered.   Medical Decision Making:   Tanya Herrera is a 37 y.o. female who presented to the ED today with right foot pain and right shoulder pain.  No signs reviewed.  Right foot pain seems more like plantar fasciitis, no specific injury.  Neurovascular intact.  Will obtain x-ray.  She also had injury to her right shoulder today from a door.  I suspect it is muscular tenderness over the deltoid will rule out fracture.  She denies any chance for pregnancy as she status post hysterectomy.  Reviewed and confirmed nursing documentation for past medical history, family history, social history.  Reassessment and Plan:   Patient stable.  Mild improvement with Motrin.  She has dorsal calcaneal spur which I think is causing some of her foot pain.  Shoulder x-ray without acute abnormality.  Recommend follow-up with PCP and Ortho if symptoms or not improving.   Patient's presentation is most consistent with acute complicated illness / injury requiring diagnostic workup.           Final Clinical Impression(s) / ED Diagnoses Final diagnoses:  Foot pain,  right    Rx / DC Orders ED Discharge Orders     None         Laurence Spates, MD 12/05/22 [phone]

## 2022-12-05 NOTE — ED Notes (Signed)
Reviewed discharge instructions with pt. Pt states understanding

## 2023-01-12 ENCOUNTER — Encounter (HOSPITAL_BASED_OUTPATIENT_CLINIC_OR_DEPARTMENT_OTHER): Payer: Self-pay | Admitting: Pediatrics

## 2023-01-12 ENCOUNTER — Emergency Department (HOSPITAL_BASED_OUTPATIENT_CLINIC_OR_DEPARTMENT_OTHER): Payer: 59

## 2023-01-12 ENCOUNTER — Other Ambulatory Visit: Payer: Self-pay

## 2023-01-12 ENCOUNTER — Emergency Department (HOSPITAL_BASED_OUTPATIENT_CLINIC_OR_DEPARTMENT_OTHER)
Admission: EM | Admit: 2023-01-12 | Discharge: 2023-01-12 | Disposition: A | Discharge status: Home / Self Care | Payer: 59 | Attending: Emergency Medicine | Admitting: Emergency Medicine

## 2023-01-12 DIAGNOSIS — J069 Acute upper respiratory infection, unspecified: Secondary | ICD-10-CM | POA: Diagnosis not present

## 2023-01-12 DIAGNOSIS — Z1152 Encounter for screening for COVID-19: Secondary | ICD-10-CM | POA: Diagnosis not present

## 2023-01-12 DIAGNOSIS — Z91040 Latex allergy status: Secondary | ICD-10-CM | POA: Diagnosis not present

## 2023-01-12 DIAGNOSIS — D509 Iron deficiency anemia, unspecified: Secondary | ICD-10-CM | POA: Diagnosis not present

## 2023-01-12 DIAGNOSIS — M62830 Muscle spasm of back: Secondary | ICD-10-CM | POA: Insufficient documentation

## 2023-01-12 DIAGNOSIS — R0602 Shortness of breath: Secondary | ICD-10-CM | POA: Diagnosis present

## 2023-01-12 LAB — CBC
HCT: 27.4 % — ABNORMAL LOW (ref 36.0–46.0)
Hemoglobin: 8 g/dL — ABNORMAL LOW (ref 12.0–15.0)
MCH: 18.6 pg — ABNORMAL LOW (ref 26.0–34.0)
MCHC: 29.2 g/dL — ABNORMAL LOW (ref 30.0–36.0)
MCV: 63.7 fL — ABNORMAL LOW (ref 80.0–100.0)
Platelets: 191 10*3/uL (ref 150–400)
RBC: 4.3 MIL/uL (ref 3.87–5.11)
RDW: 19.2 % — ABNORMAL HIGH (ref 11.5–15.5)
WBC: 4.3 10*3/uL (ref 4.0–10.5)
nRBC: 0 % (ref 0.0–0.2)

## 2023-01-12 LAB — BASIC METABOLIC PANEL
Anion gap: 13 (ref 5–15)
BUN: 13 mg/dL (ref 6–20)
CO2: 23 mmol/L (ref 22–32)
Calcium: 9.1 mg/dL (ref 8.9–10.3)
Chloride: 102 mmol/L (ref 98–111)
Creatinine, Ser: 0.91 mg/dL (ref 0.44–1.00)
GFR, Estimated: >60 mL/min (ref >60)
Glucose, Bld: 99 mg/dL (ref 70–99)
Potassium: 3.3 mmol/L — ABNORMAL LOW (ref 3.5–5.1)
Sodium: 138 mmol/L (ref 135–145)

## 2023-01-12 LAB — RESP PANEL BY RT-PCR (RSV, FLU A&B, COVID)  RVPGX2
Influenza A by PCR: NEGATIVE
Influenza B by PCR: NEGATIVE
Resp Syncytial Virus by PCR: NEGATIVE
SARS Coronavirus 2 by RT PCR: NEGATIVE

## 2023-01-12 LAB — TROPONIN I (HIGH SENSITIVITY)
Troponin I (High Sensitivity): <2 ng/L (ref <18)
Troponin I (High Sensitivity): 2 ng/L (ref <18)

## 2023-01-12 MED ORDER — METHOCARBAMOL 500 MG PO TABS
500.0000 mg | ORAL_TABLET | Freq: Two times a day (BID) | ORAL | 0 refills | Status: AC
Start: 2023-01-12 — End: ?

## 2023-01-12 MED ORDER — IPRATROPIUM-ALBUTEROL 0.5-2.5 (3) MG/3ML IN SOLN: 3.0000 mL | RESPIRATORY_TRACT | Status: AC

## 2023-01-12 MED ORDER — IPRATROPIUM-ALBUTEROL 0.5-2.5 (3) MG/3ML IN SOLN
RESPIRATORY_TRACT | Status: AC
  Administered 2023-01-12: 3 mL via RESPIRATORY_TRACT
  Filled 2023-01-12: qty 3

## 2023-01-12 MED ORDER — ALBUTEROL SULFATE HFA 108 (90 BASE) MCG/ACT IN AERS: 1.0000 | INHALATION_SPRAY | Freq: Four times a day (QID) | RESPIRATORY_TRACT | 0 refills | Status: AC | PRN

## 2023-01-12 NOTE — ED Provider Notes (Signed)
[place] [facility] Provider Note   CSN: [id number] Arrival date & time: 01/12/23  1611     History  Chief Complaint  Patient presents with   Chest Pain    Tanya Herrera is a 37 y.o. female with PMH as listed below who presents with chest pain going through the back, feels tight along with shortness of breath on exertion. Reports sxs starting last night and progressed throughout the night and worse now today. Coughing up phlegm, unknown color. Painful to breathe in and out. Middle of back is also "aching." Denies leg swelling, h/o DVT/PE, recent travel/hospitalizations/surgeries. This has happened before once when she had pneumonia. She reports decreased PO intake over the last few days but denies nausea/vomiting. Endorses a craving for ice. Used to take iron supplementation but doesn't anymore.  Per chart review Care Everywhere, patient has a history of menorrhagia and profound anemia requiring 4 units packed red blood cell transfusion in 2016 status post hysterectomy in 2017.  She denies any vaginal bleeding since that time.  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  She is followed by hematology at [facility] for this. She states that she does have daily epistaxis that comes out in clots. This is a normal problem for her. She is also followed by ENT for her HHT and she states these lesions are what cause bleeding in her nose. She has follow up with her hematologist at [facility] scheduled already for 11/14, which she scheduled in August.    Past Medical History:  Diagnosis Date   Asthma    Family history of adverse reaction to anesthesia    H/O transfusion of packed red blood cells    Hemophilia (HCC)    HHT (hereditary hemorrhagic telangiectasia) (HCC)    Hypertension    Insomnia    Major depressive disorder    Panic disorder    PTSD (post-traumatic stress disorder)        Home Medications Prior  to Admission medications   Medication Sig Start Date End Date Taking? Authorizing Provider  methocarbamol (ROBAXIN) 500 MG tablet Take 1 tablet (500 mg total) by mouth 2 (two) times daily. 01/12/23  Yes Loetta Rough, MD  albuterol (VENTOLIN HFA) 108 (90 Base) MCG/ACT inhaler Inhale 1-2 puffs into the lungs every 6 (six) hours as needed for wheezing or shortness of breath. 01/12/23   Loetta Rough, MD  clindamycin (CLEOCIN) 300 MG capsule Take 1 capsule (300 mg total) by mouth 4 (four) times daily. X 7 days 08/27/22   Palumbo, April, MD  meloxicam (MOBIC) 7.5 MG tablet Take 1 tablet (7.5 mg total) by mouth daily. 08/27/22   Palumbo, April, MD  oxyCODONE-acetaminophen (PERCOCET) 5-325 MG tablet Take 1 tablet by mouth every 6 (six) hours as needed for severe pain. 01/31/21   Molpus, John, MD  traZODone (DESYREL) 50 MG tablet Take 100 mg by mouth at bedtime.  03/25/18   [provider]      Allergies    Iron, Iron dextran, Latex, Banana, Hydrocodone-acetaminophen, and Penicillins    Review of Systems   Review of Systems A 10 point review of systems was performed and is negative unless otherwise reported in HPI.  Physical Exam Updated Vital Signs BP (!) 163/100   Pulse 98   Temp 98.4 F (36.9 C) (Oral)   Resp 20   Ht 5\' 6"  (1.676 m)   Wt 98 kg   LMP 10/19/2015   SpO2  100%   BMI 34.86 kg/m  Physical Exam General: Normal appearing female, lying in bed.  HEENT: Sclera anicteric, MMM, trachea midline.  Cardiology: RRR, no murmurs/rubs/gallops.  Resp: Normal respiratory rate and effort. CTAB, no wheezes, rhonchi, crackles.  Abd: Soft, non-tender, non-distended. No rebound tenderness or guarding.  GU: Deferred. MSK: No peripheral edema or signs of trauma. Extremities without deformity or TTP. No cyanosis or clubbing. Skin: warm, dry. No rashes or lesions. Back: No CVA tenderness. TTP to bilateral trapezius muscles in mid-thoracic back. No midline TTP.  Neuro: A&Ox4, CNs II-XII  grossly intact. MAEs. Sensation grossly intact.  Psych: Normal mood and affect.   ED Results / Procedures / Treatments   Labs (all labs ordered are listed, but only abnormal results are displayed) Labs Reviewed  BASIC METABOLIC PANEL - Abnormal; Notable for the following components:      Result Value   Potassium 3.3 (*)    All other components within normal limits  CBC - Abnormal; Notable for the following components:   Hemoglobin 8.0 (*)    HCT 27.4 (*)    MCV 63.7 (*)    MCH 18.6 (*)    MCHC 29.2 (*)    RDW 19.2 (*)    All other components within normal limits  RESP PANEL BY RT-PCR (RSV, FLU A&B, COVID)  RVPGX2  TROPONIN I (HIGH SENSITIVITY)  TROPONIN I (HIGH SENSITIVITY)    EKG EKG Interpretation Date/Time:  Monday January 12 2023 16:22:52 EDT Ventricular Rate:  89 PR Interval:  172 QRS Duration:  94 QT Interval:  368 QTC Calculation: 448 R Axis:   63  Text Interpretation: Sinus rhythm Confirmed by Vivi Barrack [phone]) on 01/12/2023 5:38:10 PM  Radiology DG Chest 2 View  Result Date: 01/12/2023 CLINICAL DATA:  Chest pain EXAM: CHEST - 2 VIEW COMPARISON:  03/21/2020 FINDINGS: The heart size and mediastinal contours are within normal limits. Both lungs are clear. The visualized skeletal structures are unremarkable. IMPRESSION: No active cardiopulmonary disease. Electronically Signed   By: Jasmine Pang M.D.   On: 01/12/2023 19:38    Procedures Procedures    Medications Ordered in ED Medications  ipratropium-albuterol (DUONEB) 0.5-2.5 (3) MG/3ML nebulizer solution 3 mL (3 mLs Nebulization Given 01/12/23 2024)    ED Course/ Medical Decision Making/ A&P                          Medical Decision Making Amount and/or Complexity of Data Reviewed Labs: ordered. Decision-making details documented in ED Course. Radiology: ordered.  Risk Prescription drug management.    This patient presents to the ED for concern of chest pain, shortness of breath, this  involves an extensive number of treatment options, and is a complaint that carries with it a high risk of complications and morbidity.  I considered the following differential and admission for this acute, potentially life threatening condition.   MDM:    Patient with chest pain shortness of breath, productive cough, rhinorrhea fatigue, DOE concerning for viral infection versus pneumonia.  Patient has a history of hemophilia as well as HHT with daily epistaxis and consider severe anemia.  She states in the past she had hemoglobin nadir as low as 3.  Consider ACS though patient has no history of cardiac disease and symptoms are not suggestive of ACS, her EKG does not demonstrate signs of ischemia and her troponin is negative x 2.  Her chest x-ray reassuringly does not demonstrate any signs of pneumonia, pulmonary  edema, pleural effusion, or PTX.  She has had no leg swelling and does PERC out for PE, additionally she has a bleeding diathesis rather than a clotting disorder and I believe risk of PE is very low for this patient.  Her viral panel is negative though I do still believe patient is a viral upper respiratory infection causing her symptoms. No electrolyte derangements or renal injury.   Wheezing which improved with duoneb, she has no h/o asthma, recommended albuterol scheduled q4-6 hours over the next few days and then PRN after that for bronchospasm. Albuterol inhaler was refilled with the pharmacy.   She is anemic here to 8.0 down mildly from 8.7 two months ago. No active bleeding here. Also instructed patient to restart her iron supplements or take a prenatal vitamin for iron supplementation and to follow up with recommendations from her hematologist.   Will prescribe robaxin for muscle spasms of back and instructed patient to stop taking motrin, as this is contraindicated with her hemophilia. Advised to take tylenol 1,000 mg q8h for pain. Instructed to f/u with her PCP as well. DC w/ discharge  instructions/return precautions. All questions answered to patient's satisfaction.    Clinical Course as of 01/12/23 2226  Mon Jan 12, 2023  2200 Hemoglobin(!): 8.0 Most recently 8.7 on 10/31/22. Decreasing from 9.7 on 07/30/22 and 11.9 on 01/17/22. Has daily epistaxis but is not currently bleeding. She has appt with [facility] hematology on 02/05/23. Microcytic. In conjunction with microcytic anemia and ice craving, likely iron deficiency from chronic blood loss. [HN]  2200 Troponin I (High Sensitivity): 2 Neg x2 [HN]  2201 Resp panel by RT-PCR (RSV, Flu A&B, Covid) Anterior Nasal Swab neg [HN]    Clinical Course User Index [HN] Loetta Rough, MD    Labs: I Ordered, and personally interpreted labs.  The pertinent results include:  those listed above  Imaging Studies ordered: CXR ordered from triage I independently visualized and interpreted imaging. I agree with the radiologist interpretation  Additional history obtained from chart review.  External records from outside source obtained and reviewed including [facility]  Reevaluation: After the interventions noted above, I reevaluated the patient and found that they have :improved  Social Determinants of Health: Lives independently  Disposition:  DC   Co morbidities that complicate the patient evaluation  Past Medical History:  Diagnosis Date   Asthma    Family history of adverse reaction to anesthesia    H/O transfusion of packed red blood cells    Hemophilia (HCC)    HHT (hereditary hemorrhagic telangiectasia) (HCC)    Hypertension    Insomnia    Major depressive disorder    Panic disorder    PTSD (post-traumatic stress disorder)      Medicines Meds ordered this encounter  Medications   ipratropium-albuterol (DUONEB) 0.5-2.5 (3) MG/3ML nebulizer solution    Rushie Chestnut S: cabinet override   ipratropium-albuterol (DUONEB) 0.5-2.5 (3) MG/3ML nebulizer solution 3 mL   methocarbamol (ROBAXIN) 500 MG tablet    Sig: Take  1 tablet (500 mg total) by mouth 2 (two) times daily.    Dispense:  20 tablet    Refill:  0   albuterol (VENTOLIN HFA) 108 (90 Base) MCG/ACT inhaler    Sig: Inhale 1-2 puffs into the lungs every 6 (six) hours as needed for wheezing or shortness of breath.    Dispense:  1 each    Refill:  0    I have reviewed the patients home medicines and  have made adjustments as needed  Problem List / ED Course: Problem List Items Addressed This Visit       Other   Anemia - Primary   Other Visit Diagnoses     Viral upper respiratory infection       Muscle spasm of back                       This note was created using dictation software, which may contain spelling or grammatical errors.    Loetta Rough, MD 01/16/23 [phone]

## 2023-01-12 NOTE — ED Triage Notes (Signed)
C/O chest pain going through the back, feels tight along with shortness of breath on exertion.

## 2023-01-12 NOTE — Discharge Instructions (Addendum)
Thank you for coming to [facility] Emergency Department. You were seen for chest pain, shortness of breath, fatigue. We did an exam, labs, and imaging, and these showed a hemoglobin 8.0 and likely a viral upper respiratory infection. Your chest x-ray was negative for pneumonia. You had wheezing that improved after an inhaler. Please utilize your albuterol inhaler once every 4-6 hours for the next few days and then as needed after that for wheezing. This has been refilled at the pharmacy. We have also prescribed robaxin for back spasms which you can take 500 mg twice per day. Please take tylenol 1,000 mg every 8 hours for pain as well and a heat pack or massage can also help the back pain.   Please follow up with your primary care provider within 1 week. Please also follow up with your hematologist on 02/05/23, or sooner if you can get a sooner appointment. You can take a prenatal vitamin or your iron supplements in the meantime.   **Please stop taking NSAIDs such as motrin, ibuprofen, aleve, naproxen, as these can worsen your bleeding and are contraindicated with your hemophilia.**  Do not hesitate to return to the ED or call 911 if you experience: -Worsening symptoms -Worsening fatigue, chest pain, shortness of breath -Nose bleeds that are severe or do not stop -Lightheadedness, passing out -Fevers/chills -Anything else that concerns you

## 2023-01-12 NOTE — ED Notes (Signed)
Pt given 2.5mg  of duo neb.  Breath sounds afterwords clear. No distress noted afterward.  Xray clear still complaining of back pain
